# Patient Record
Sex: Male | Born: 1975
Health system: Southern US, Community
[De-identification: ages and names within clinical notes are randomized; demographics above are authoritative.]

## PROBLEM LIST (undated history)

## (undated) DIAGNOSIS — Z720 Tobacco use: Secondary | ICD-10-CM

## (undated) DIAGNOSIS — I2510 Atherosclerotic heart disease of native coronary artery without angina pectoris: Secondary | ICD-10-CM

## (undated) DIAGNOSIS — Z888 Allergy status to other drugs, medicaments and biological substances status: Secondary | ICD-10-CM

## (undated) DIAGNOSIS — M1990 Unspecified osteoarthritis, unspecified site: Secondary | ICD-10-CM

## (undated) DIAGNOSIS — Z9861 Coronary angioplasty status: Secondary | ICD-10-CM

## (undated) DIAGNOSIS — G5791 Unspecified mononeuropathy of right lower limb: Secondary | ICD-10-CM

## (undated) DIAGNOSIS — E785 Hyperlipidemia, unspecified: Secondary | ICD-10-CM

## (undated) DIAGNOSIS — I10 Essential (primary) hypertension: Secondary | ICD-10-CM

## (undated) DIAGNOSIS — S2502XA Major laceration of thoracic aorta, initial encounter: Secondary | ICD-10-CM

## (undated) DIAGNOSIS — I255 Ischemic cardiomyopathy: Secondary | ICD-10-CM

## (undated) HISTORY — PX: FRACTURE SURGERY: SHX138

## (undated) HISTORY — DX: Tobacco use: Z72.0

## (undated) HISTORY — PX: TOOTH EXTRACTION: SUR596

## (undated) HISTORY — PX: REPAIR THORACIC AORTA: SUR1211

## (undated) HISTORY — PX: ORTHOPEDIC SURGERY: SHX850

## (undated) HISTORY — PX: OTHER SURGICAL HISTORY: SHX169

---

## 2001-01-13 ENCOUNTER — Emergency Department (HOSPITAL_COMMUNITY): Admission: EM | Admit: 2001-01-13 | Discharge: 2001-01-14 | Payer: Self-pay | Admitting: *Deleted

## 2004-03-21 ENCOUNTER — Ambulatory Visit: Payer: Self-pay | Admitting: Critical Care Medicine

## 2004-03-21 ENCOUNTER — Inpatient Hospital Stay (HOSPITAL_COMMUNITY): Admission: AC | Admit: 2004-03-21 | Discharge: 2004-04-20 | Payer: Self-pay

## 2004-03-21 ENCOUNTER — Ambulatory Visit: Payer: Self-pay | Admitting: Internal Medicine

## 2004-03-21 ENCOUNTER — Ambulatory Visit: Payer: Self-pay | Admitting: Physical Medicine & Rehabilitation

## 2004-03-21 ENCOUNTER — Ambulatory Visit: Payer: Self-pay

## 2004-03-21 ENCOUNTER — Ambulatory Visit: Payer: Self-pay | Admitting: Plastic Surgery

## 2004-04-05 ENCOUNTER — Ambulatory Visit: Payer: Self-pay | Admitting: Cardiology

## 2004-04-05 ENCOUNTER — Ambulatory Visit: Payer: Self-pay | Admitting: Plastic Surgery

## 2004-04-20 ENCOUNTER — Ambulatory Visit: Payer: Self-pay | Admitting: Physical Medicine & Rehabilitation

## 2004-04-20 ENCOUNTER — Inpatient Hospital Stay (HOSPITAL_COMMUNITY)
Admission: RE | Admit: 2004-04-20 | Discharge: 2004-04-28 | Payer: Self-pay | Admitting: Physical Medicine & Rehabilitation

## 2004-05-12 ENCOUNTER — Encounter
Admission: RE | Admit: 2004-05-12 | Discharge: 2004-07-07 | Payer: Self-pay | Admitting: Physical Medicine & Rehabilitation

## 2004-05-13 ENCOUNTER — Emergency Department (HOSPITAL_COMMUNITY): Admission: EM | Admit: 2004-05-13 | Discharge: 2004-05-13 | Payer: Self-pay

## 2004-05-14 ENCOUNTER — Ambulatory Visit: Payer: Self-pay | Admitting: Physical Medicine & Rehabilitation

## 2004-06-30 ENCOUNTER — Emergency Department (HOSPITAL_COMMUNITY): Admission: EM | Admit: 2004-06-30 | Discharge: 2004-07-01 | Payer: Self-pay | Admitting: Emergency Medicine

## 2004-07-05 ENCOUNTER — Ambulatory Visit: Payer: Self-pay | Admitting: Physical Medicine & Rehabilitation

## 2004-07-31 ENCOUNTER — Encounter
Admission: RE | Admit: 2004-07-31 | Discharge: 2004-10-29 | Payer: Self-pay | Admitting: Physical Medicine & Rehabilitation

## 2004-08-03 ENCOUNTER — Ambulatory Visit: Payer: Self-pay | Admitting: Physical Medicine & Rehabilitation

## 2004-09-06 ENCOUNTER — Ambulatory Visit: Payer: Self-pay | Admitting: Physical Medicine & Rehabilitation

## 2004-10-01 ENCOUNTER — Ambulatory Visit: Payer: Self-pay | Admitting: Physical Medicine & Rehabilitation

## 2004-12-10 ENCOUNTER — Encounter
Admission: RE | Admit: 2004-12-10 | Discharge: 2005-03-10 | Payer: Self-pay | Admitting: Physical Medicine & Rehabilitation

## 2004-12-10 ENCOUNTER — Ambulatory Visit: Payer: Self-pay | Admitting: Physical Medicine & Rehabilitation

## 2005-01-17 ENCOUNTER — Ambulatory Visit: Payer: Self-pay | Admitting: Physical Medicine & Rehabilitation

## 2005-02-24 ENCOUNTER — Encounter: Admission: RE | Admit: 2005-02-24 | Discharge: 2005-02-24 | Payer: Self-pay | Admitting: Orthopedic Surgery

## 2005-03-04 ENCOUNTER — Ambulatory Visit: Payer: Self-pay | Admitting: Physical Medicine & Rehabilitation

## 2005-04-02 ENCOUNTER — Encounter
Admission: RE | Admit: 2005-04-02 | Discharge: 2005-07-01 | Payer: Self-pay | Admitting: Physical Medicine & Rehabilitation

## 2005-05-03 ENCOUNTER — Ambulatory Visit: Payer: Self-pay | Admitting: Physical Medicine & Rehabilitation

## 2005-06-07 ENCOUNTER — Ambulatory Visit (HOSPITAL_BASED_OUTPATIENT_CLINIC_OR_DEPARTMENT_OTHER): Admission: RE | Admit: 2005-06-07 | Discharge: 2005-06-08 | Payer: Self-pay | Admitting: Orthopedic Surgery

## 2005-07-01 ENCOUNTER — Encounter
Admission: RE | Admit: 2005-07-01 | Discharge: 2005-09-29 | Payer: Self-pay | Admitting: Physical Medicine & Rehabilitation

## 2005-07-01 ENCOUNTER — Ambulatory Visit: Payer: Self-pay | Admitting: Physical Medicine & Rehabilitation

## 2005-07-27 ENCOUNTER — Encounter
Admission: RE | Admit: 2005-07-27 | Discharge: 2005-10-25 | Payer: Self-pay | Admitting: Physical Medicine & Rehabilitation

## 2005-08-22 ENCOUNTER — Ambulatory Visit: Payer: Self-pay | Admitting: Physical Medicine & Rehabilitation

## 2005-10-04 ENCOUNTER — Ambulatory Visit (HOSPITAL_BASED_OUTPATIENT_CLINIC_OR_DEPARTMENT_OTHER): Admission: RE | Admit: 2005-10-04 | Discharge: 2005-10-04 | Payer: Self-pay | Admitting: Orthopedic Surgery

## 2005-10-20 ENCOUNTER — Ambulatory Visit: Payer: Self-pay | Admitting: Physical Medicine & Rehabilitation

## 2005-10-20 ENCOUNTER — Encounter
Admission: RE | Admit: 2005-10-20 | Discharge: 2006-01-18 | Payer: Self-pay | Admitting: Physical Medicine & Rehabilitation

## 2005-11-03 IMAGING — CR DG SINUSES COMPLETE 3+V
2 series · 2 of 2 positions shown · non-contrast
Comparison: none

CLINICAL DATA: Head injury. Clinical concern for sinusitis.

PARANASAL SINUSES - PORTABLE 2 VIEW

[view not recorded (1 of 2)]
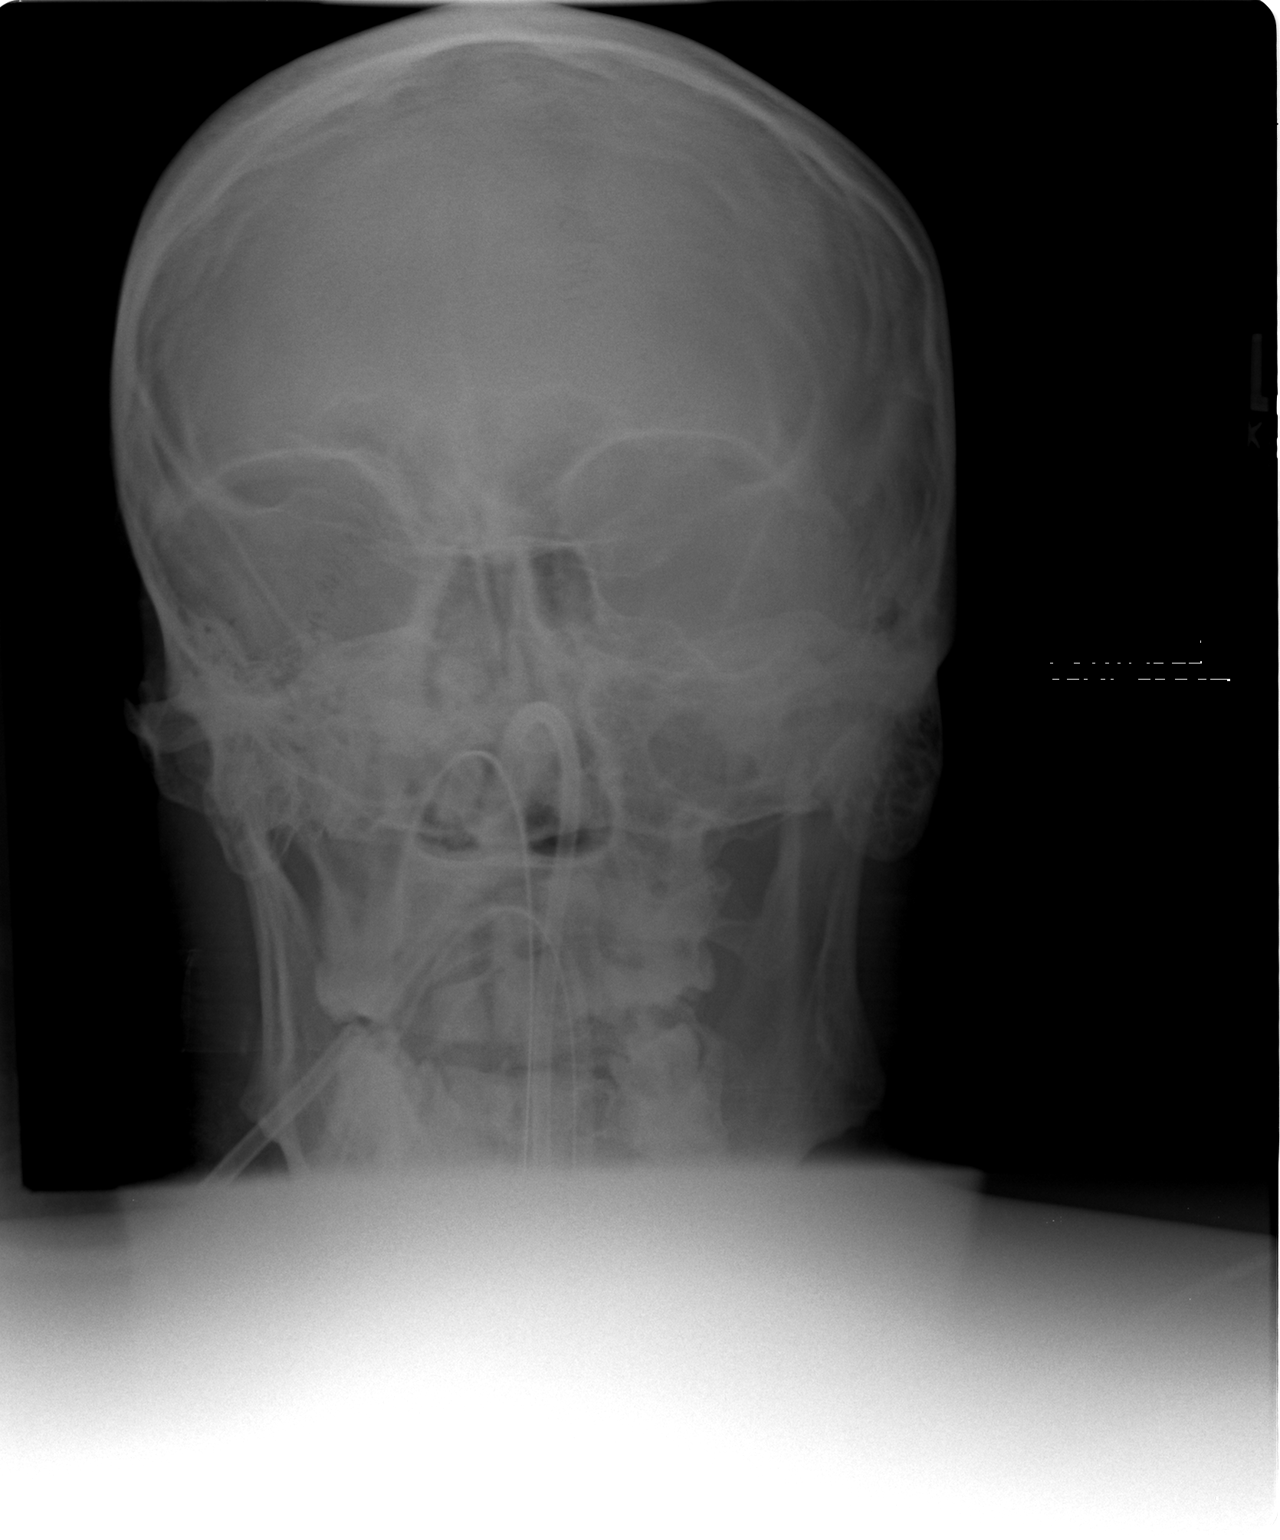

[view not recorded (2 of 2)]
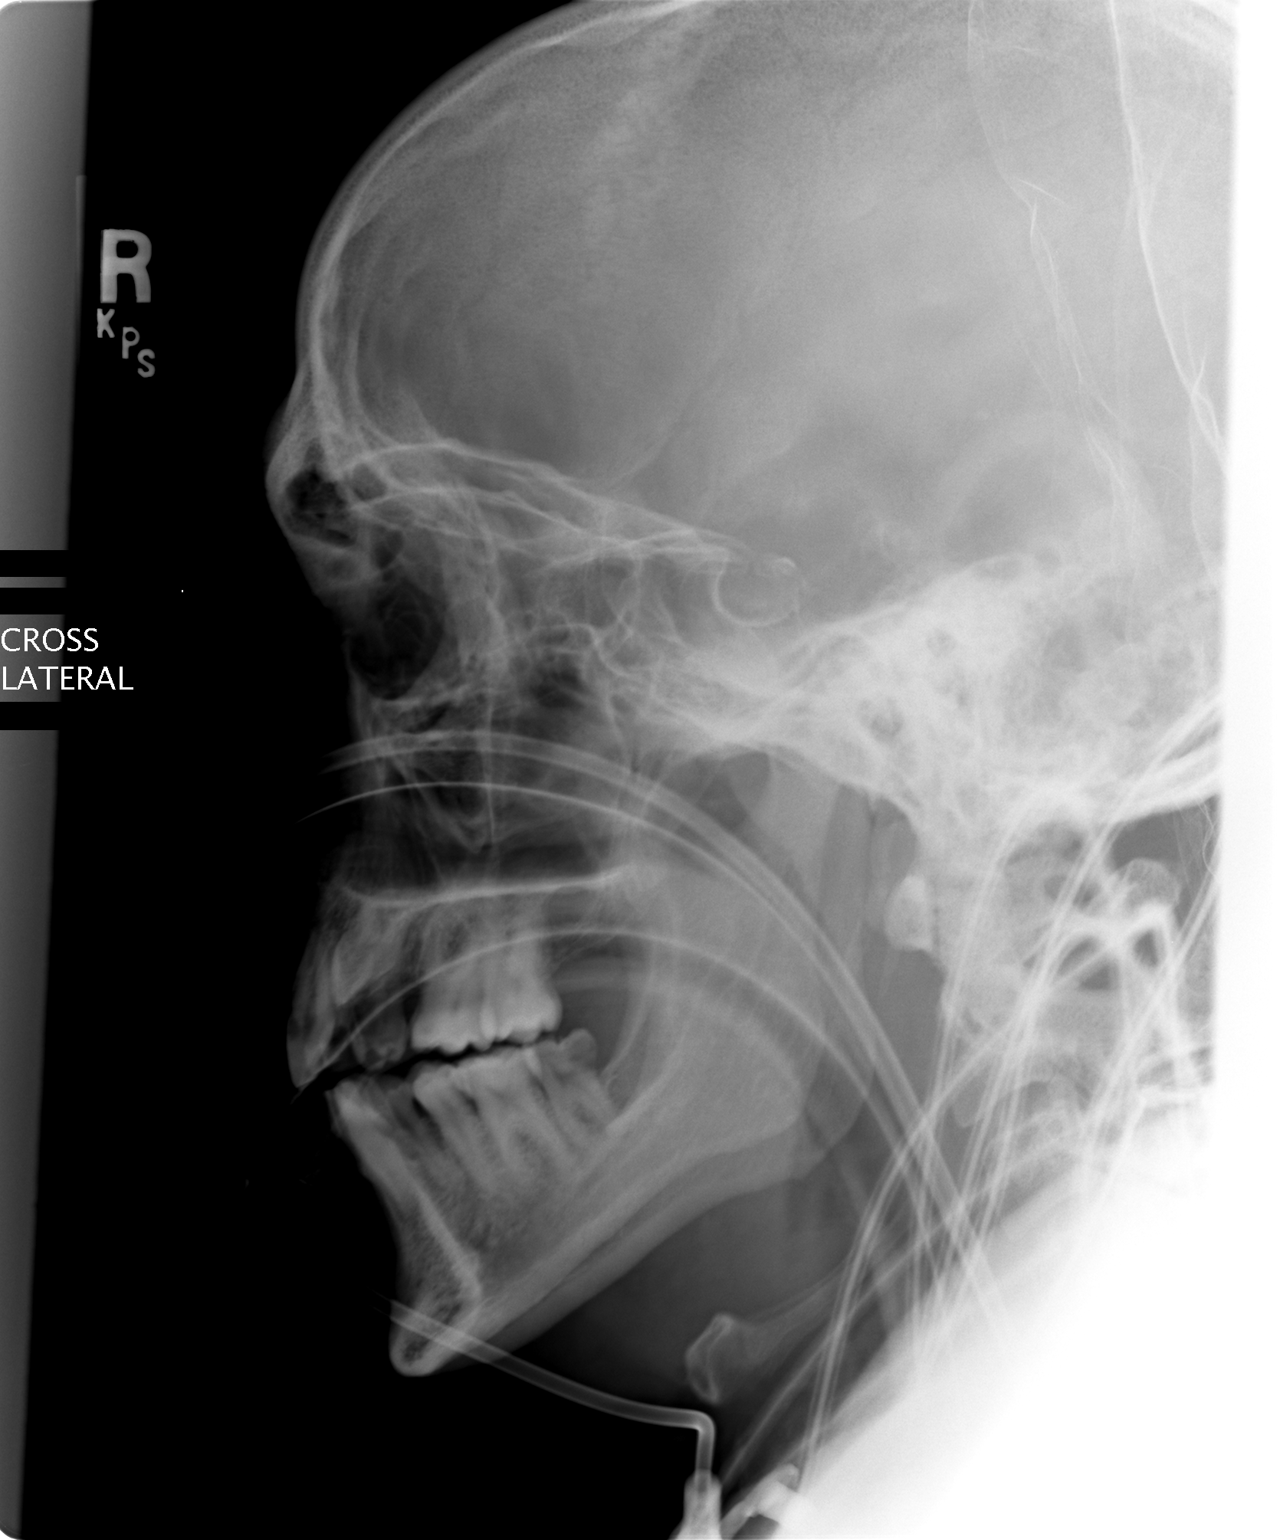

[2 of 2 positions shown; findings below may reference images not displayed]

FINDINGS: Portable frontal and lateral views of the paranasal sinuses are
correlated with the head CT dated 03/21/2004. The maxillary sinuses are obscured
by the petrous ridges on the frontal view. No gross mucosal thickening or
air-fluid levels seen.

IMPRESSION

Limited examination with no gross evidence of sinusitis. If there is a continued
clinical concern for possibility of sinusitis, a limited paranasal sinus CT
would be recommended.

## 2005-11-03 IMAGING — CR DG CHEST 1V PORT
1 series · 1 of 1 positions shown · non-contrast
Comparison: none

CLINICAL DATA: Multiple trauma.  
 PORTABLE CHEST ? ONE VIEW:
 Comparison to 04/04/04.
 Endotracheal tube, NG tube, small-bore feeding tube, and right-sided PICC line noted.  There has been interval removal of one of the left thoracostomy tubes.  No definite pneumothorax identified.  There is increasing interstitial pulmonary edema and basilar atelectasis.  Multiple left-sided rib fractures again noted.

[view not recorded]
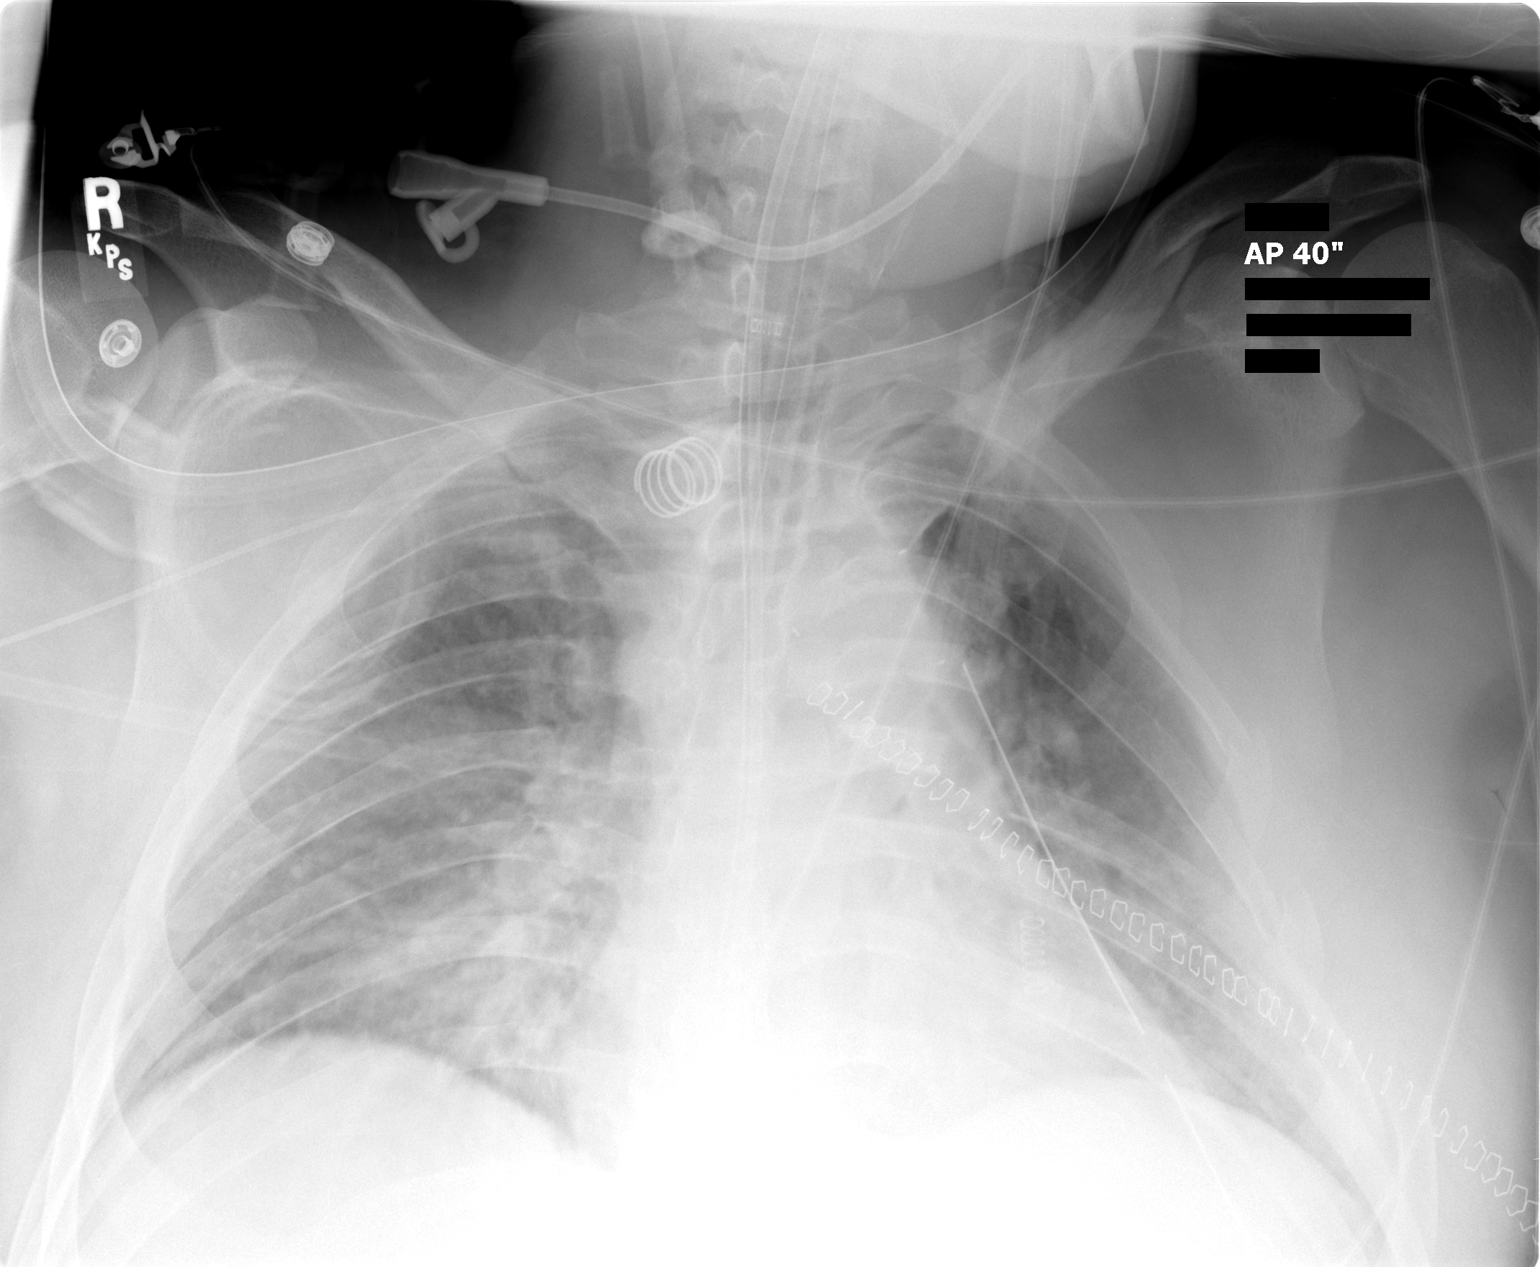

[1 of 1 positions shown; findings below may reference images not displayed]

IMPRESSION: 1.  Left thoracostomy tube removal with increasing pulmonary edema and basilar atelectasis.

## 2005-11-06 IMAGING — CR DG CHEST 1V PORT
1 series · 1 of 1 positions shown · non-contrast
Comparison: Earlier today.

CLINICAL DATA: Changed left chest tube from suction to water seal.

PORTABLE CHEST - 1 VIEW

[view not recorded]
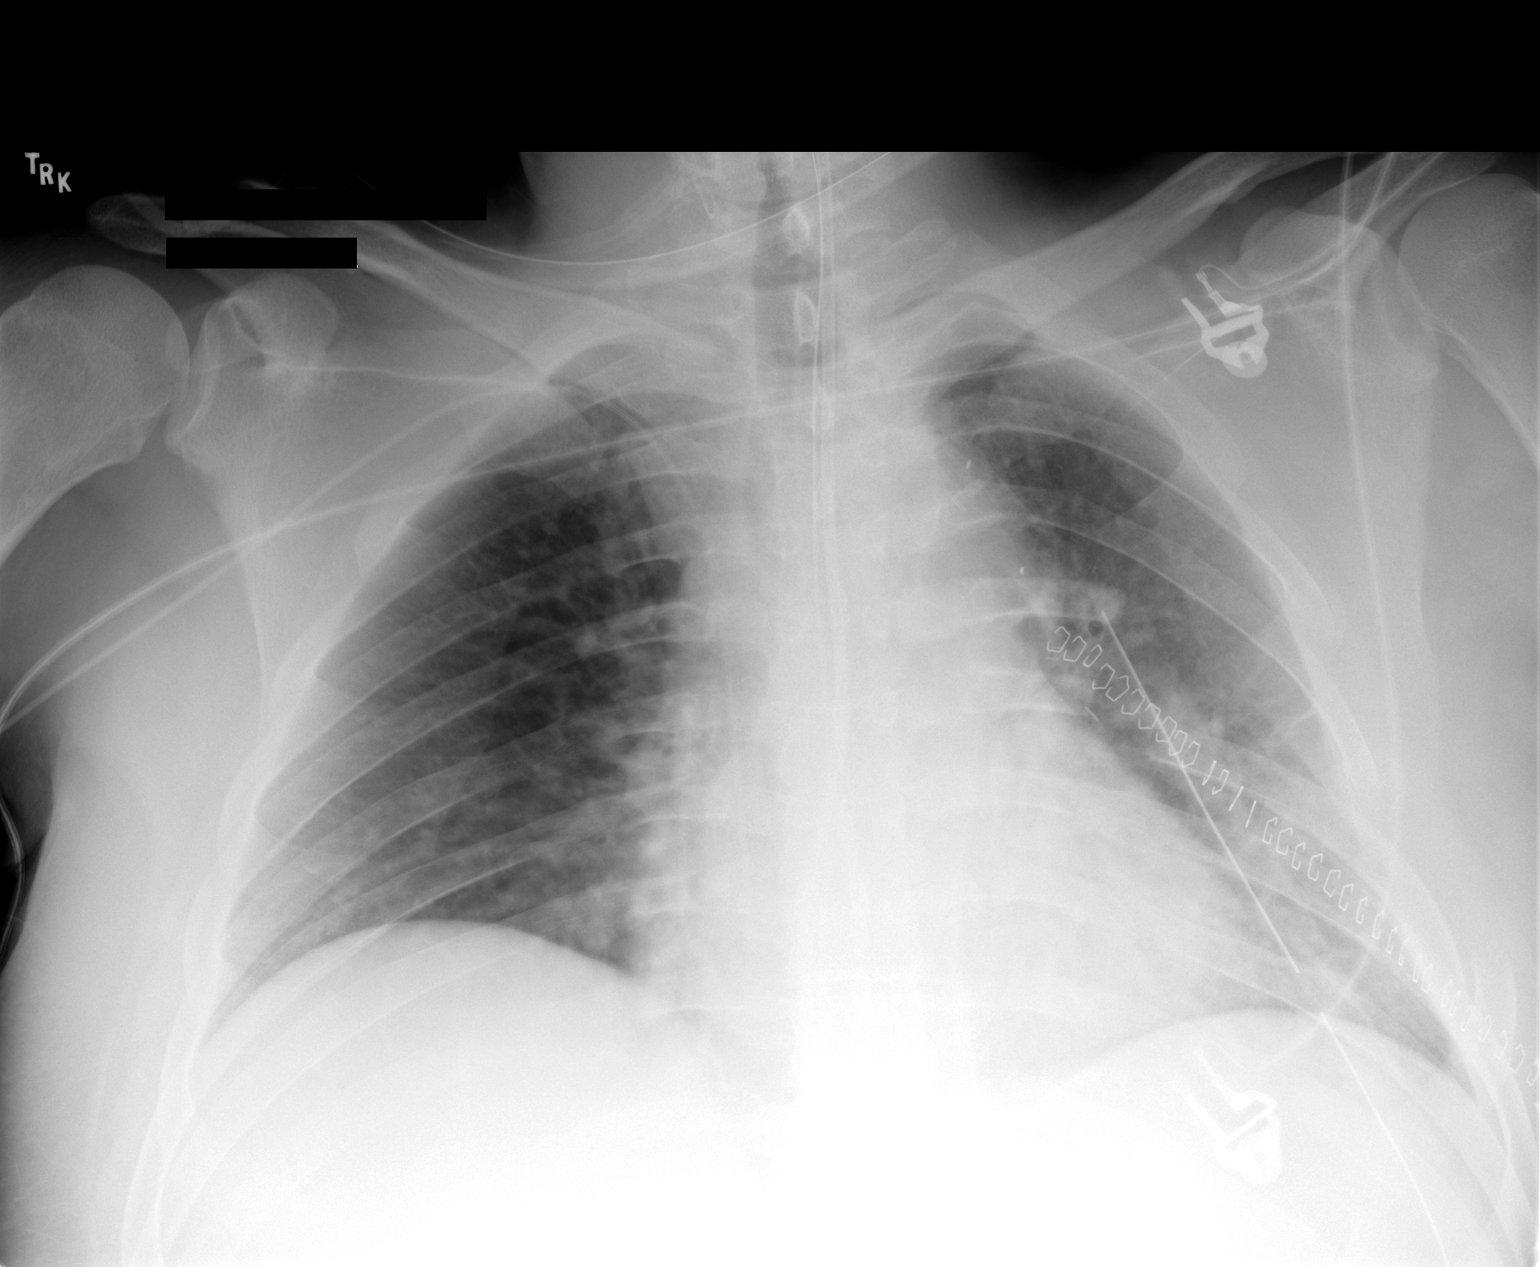

[1 of 1 positions shown; findings below may reference images not displayed]

FINDINGS: Stable left chest tube. No pneumothorax. Breathing motion artifact
with no gross change in diffuse prominence of the pulmonary vasculature and
borderline enlargement of the cardiac silhouette. Stable right PICC. Nasogastric
and feeding tubes remain in place. Stable left rib fractures.  

IMPRESSION

1. No pneumothorax.

2. Stable pulmonary vascular congestion and borderline cardiomegaly.

## 2005-11-07 IMAGING — CR DG CHEST 1V PORT
1 series · 1 of 1 positions shown · non-contrast
Comparison: 04/08/04.

CLINICAL DATA: Multiple trauma follow-up. 
 PORTABLE CHEST ONE VIEW 04/09/04:

[view not recorded]
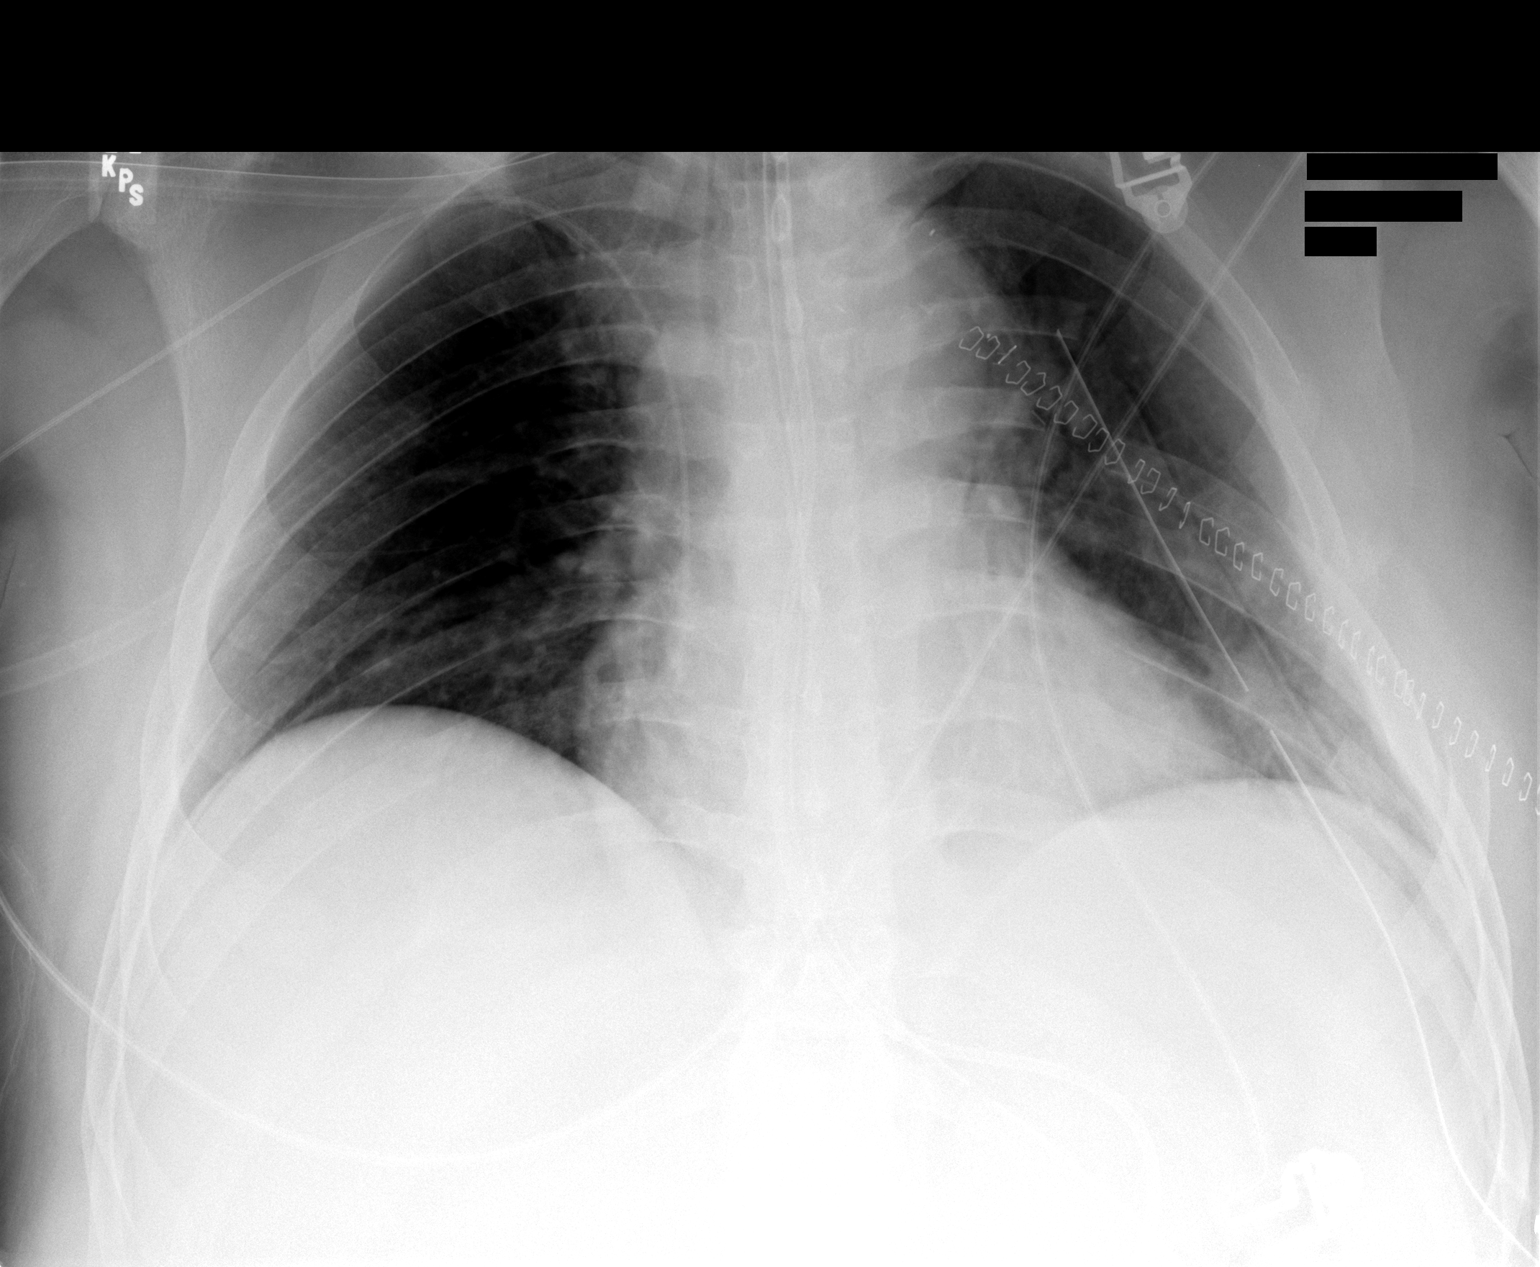

[1 of 1 positions shown; findings below may reference images not displayed]

Low volume film with stable NG tube, feeding tube, right-sided PICC line, and left thoracostomy tube.  Mild bibasilar atelectasis left greater than right is essentially unchanged.  There is no evidence of pneumothorax.  Rib fractures are again noted.
IMPRESSION: Stable chest.

## 2005-11-09 ENCOUNTER — Ambulatory Visit: Payer: Self-pay | Admitting: Physical Medicine & Rehabilitation

## 2005-11-09 ENCOUNTER — Encounter
Admission: RE | Admit: 2005-11-09 | Discharge: 2006-02-07 | Payer: Self-pay | Admitting: Physical Medicine & Rehabilitation

## 2005-12-30 ENCOUNTER — Ambulatory Visit: Payer: Self-pay | Admitting: Physical Medicine & Rehabilitation

## 2006-01-25 ENCOUNTER — Encounter: Payer: Self-pay | Admitting: Orthopedic Surgery

## 2006-01-25 ENCOUNTER — Ambulatory Visit (HOSPITAL_COMMUNITY): Admission: RE | Admit: 2006-01-25 | Discharge: 2006-01-26 | Payer: Self-pay | Admitting: Orthopedic Surgery

## 2006-01-31 ENCOUNTER — Encounter
Admission: RE | Admit: 2006-01-31 | Discharge: 2006-05-01 | Payer: Self-pay | Admitting: Physical Medicine & Rehabilitation

## 2006-01-31 ENCOUNTER — Ambulatory Visit: Payer: Self-pay | Admitting: Physical Medicine & Rehabilitation

## 2006-02-28 ENCOUNTER — Ambulatory Visit: Payer: Self-pay | Admitting: Physical Medicine & Rehabilitation

## 2006-04-28 ENCOUNTER — Ambulatory Visit: Payer: Self-pay | Admitting: Physical Medicine & Rehabilitation

## 2006-05-25 ENCOUNTER — Encounter
Admission: RE | Admit: 2006-05-25 | Discharge: 2006-08-23 | Payer: Self-pay | Admitting: Physical Medicine & Rehabilitation

## 2006-06-21 ENCOUNTER — Ambulatory Visit: Payer: Self-pay | Admitting: Physical Medicine & Rehabilitation

## 2006-08-18 ENCOUNTER — Ambulatory Visit: Payer: Self-pay | Admitting: Physical Medicine & Rehabilitation

## 2006-09-15 ENCOUNTER — Encounter
Admission: RE | Admit: 2006-09-15 | Discharge: 2006-12-14 | Payer: Self-pay | Admitting: Physical Medicine & Rehabilitation

## 2006-10-18 ENCOUNTER — Ambulatory Visit: Payer: Self-pay | Admitting: Physical Medicine & Rehabilitation

## 2006-12-08 ENCOUNTER — Ambulatory Visit: Payer: Self-pay | Admitting: Physical Medicine & Rehabilitation

## 2007-01-08 ENCOUNTER — Encounter
Admission: RE | Admit: 2007-01-08 | Discharge: 2007-04-08 | Payer: Self-pay | Admitting: Physical Medicine & Rehabilitation

## 2007-02-06 ENCOUNTER — Ambulatory Visit: Payer: Self-pay | Admitting: Physical Medicine & Rehabilitation

## 2007-04-03 ENCOUNTER — Ambulatory Visit: Payer: Self-pay | Admitting: Physical Medicine & Rehabilitation

## 2007-05-07 ENCOUNTER — Ambulatory Visit: Payer: Self-pay | Admitting: Physical Medicine & Rehabilitation

## 2007-05-07 ENCOUNTER — Encounter
Admission: RE | Admit: 2007-05-07 | Discharge: 2007-08-05 | Payer: Self-pay | Admitting: Physical Medicine & Rehabilitation

## 2007-06-01 ENCOUNTER — Ambulatory Visit: Payer: Self-pay | Admitting: Physical Medicine & Rehabilitation

## 2007-07-31 ENCOUNTER — Encounter
Admission: RE | Admit: 2007-07-31 | Discharge: 2007-10-29 | Payer: Self-pay | Admitting: Physical Medicine & Rehabilitation

## 2007-08-03 ENCOUNTER — Ambulatory Visit: Payer: Self-pay | Admitting: Physical Medicine & Rehabilitation

## 2007-08-30 ENCOUNTER — Ambulatory Visit: Payer: Self-pay | Admitting: Physical Medicine & Rehabilitation

## 2007-09-27 ENCOUNTER — Ambulatory Visit: Payer: Self-pay | Admitting: Physical Medicine & Rehabilitation

## 2007-10-22 ENCOUNTER — Encounter
Admission: RE | Admit: 2007-10-22 | Discharge: 2008-01-20 | Payer: Self-pay | Admitting: Physical Medicine & Rehabilitation

## 2007-10-24 ENCOUNTER — Ambulatory Visit: Payer: Self-pay | Admitting: Physical Medicine & Rehabilitation

## 2007-11-21 ENCOUNTER — Ambulatory Visit: Payer: Self-pay | Admitting: Physical Medicine & Rehabilitation

## 2007-12-19 ENCOUNTER — Ambulatory Visit: Payer: Self-pay | Admitting: Physical Medicine & Rehabilitation

## 2008-01-14 ENCOUNTER — Ambulatory Visit: Payer: Self-pay | Admitting: Physical Medicine & Rehabilitation

## 2008-02-06 ENCOUNTER — Encounter
Admission: RE | Admit: 2008-02-06 | Discharge: 2008-05-06 | Payer: Self-pay | Admitting: Physical Medicine & Rehabilitation

## 2008-02-08 ENCOUNTER — Ambulatory Visit: Payer: Self-pay | Admitting: Physical Medicine & Rehabilitation

## 2008-03-07 ENCOUNTER — Ambulatory Visit: Payer: Self-pay | Admitting: Physical Medicine & Rehabilitation

## 2008-04-04 ENCOUNTER — Ambulatory Visit: Payer: Self-pay | Admitting: Physical Medicine & Rehabilitation

## 2008-04-30 ENCOUNTER — Ambulatory Visit: Payer: Self-pay | Admitting: Physical Medicine & Rehabilitation

## 2008-05-21 ENCOUNTER — Encounter
Admission: RE | Admit: 2008-05-21 | Discharge: 2008-08-15 | Payer: Self-pay | Admitting: Physical Medicine & Rehabilitation

## 2008-05-30 ENCOUNTER — Ambulatory Visit: Payer: Self-pay | Admitting: Physical Medicine & Rehabilitation

## 2008-06-25 ENCOUNTER — Ambulatory Visit: Payer: Self-pay | Admitting: Physical Medicine & Rehabilitation

## 2008-07-25 ENCOUNTER — Ambulatory Visit: Payer: Self-pay | Admitting: Physical Medicine & Rehabilitation

## 2008-08-15 ENCOUNTER — Encounter
Admission: RE | Admit: 2008-08-15 | Discharge: 2008-11-12 | Payer: Self-pay | Admitting: Physical Medicine & Rehabilitation

## 2008-08-20 ENCOUNTER — Ambulatory Visit: Payer: Self-pay | Admitting: Physical Medicine & Rehabilitation

## 2008-09-17 ENCOUNTER — Ambulatory Visit: Payer: Self-pay | Admitting: Physical Medicine & Rehabilitation

## 2008-10-17 ENCOUNTER — Ambulatory Visit: Payer: Self-pay | Admitting: Physical Medicine & Rehabilitation

## 2008-11-12 ENCOUNTER — Encounter
Admission: RE | Admit: 2008-11-12 | Discharge: 2009-02-10 | Payer: Self-pay | Admitting: Physical Medicine & Rehabilitation

## 2008-11-14 ENCOUNTER — Ambulatory Visit: Payer: Self-pay | Admitting: Physical Medicine & Rehabilitation

## 2008-12-15 ENCOUNTER — Ambulatory Visit: Payer: Self-pay | Admitting: Physical Medicine & Rehabilitation

## 2009-01-16 ENCOUNTER — Ambulatory Visit: Payer: Self-pay | Admitting: Physical Medicine & Rehabilitation

## 2009-02-10 ENCOUNTER — Encounter
Admission: RE | Admit: 2009-02-10 | Discharge: 2009-04-29 | Payer: Self-pay | Admitting: Physical Medicine & Rehabilitation

## 2009-02-13 ENCOUNTER — Ambulatory Visit: Payer: Self-pay | Admitting: Physical Medicine & Rehabilitation

## 2009-03-16 ENCOUNTER — Ambulatory Visit: Payer: Self-pay | Admitting: Physical Medicine & Rehabilitation

## 2009-04-15 ENCOUNTER — Ambulatory Visit: Payer: Self-pay | Admitting: Physical Medicine & Rehabilitation

## 2009-05-15 ENCOUNTER — Encounter
Admission: RE | Admit: 2009-05-15 | Discharge: 2009-08-13 | Payer: Self-pay | Admitting: Physical Medicine & Rehabilitation

## 2009-05-18 ENCOUNTER — Ambulatory Visit: Payer: Self-pay | Admitting: Physical Medicine & Rehabilitation

## 2009-06-11 ENCOUNTER — Ambulatory Visit: Payer: Self-pay | Admitting: Physical Medicine & Rehabilitation

## 2009-07-08 ENCOUNTER — Ambulatory Visit: Payer: Self-pay | Admitting: Physical Medicine & Rehabilitation

## 2009-07-30 ENCOUNTER — Encounter (HOSPITAL_BASED_OUTPATIENT_CLINIC_OR_DEPARTMENT_OTHER)
Admission: RE | Admit: 2009-07-30 | Discharge: 2009-10-28 | Payer: Self-pay | Admitting: Physical Medicine & Rehabilitation

## 2009-08-06 ENCOUNTER — Ambulatory Visit: Payer: Self-pay | Admitting: Physical Medicine & Rehabilitation

## 2009-09-08 ENCOUNTER — Encounter
Admission: RE | Admit: 2009-09-08 | Discharge: 2009-12-07 | Payer: Self-pay | Admitting: Physical Medicine & Rehabilitation

## 2009-09-10 ENCOUNTER — Ambulatory Visit: Payer: Self-pay | Admitting: Physical Medicine & Rehabilitation

## 2009-09-25 ENCOUNTER — Ambulatory Visit (HOSPITAL_COMMUNITY): Admission: RE | Admit: 2009-09-25 | Discharge: 2009-09-25 | Payer: Self-pay | Admitting: General Surgery

## 2009-10-08 ENCOUNTER — Ambulatory Visit: Payer: Self-pay | Admitting: Physical Medicine & Rehabilitation

## 2009-11-04 ENCOUNTER — Ambulatory Visit: Payer: Self-pay | Admitting: Physical Medicine & Rehabilitation

## 2009-11-04 ENCOUNTER — Encounter (HOSPITAL_BASED_OUTPATIENT_CLINIC_OR_DEPARTMENT_OTHER): Admission: RE | Admit: 2009-11-04 | Discharge: 2010-02-02 | Payer: Self-pay | Admitting: Internal Medicine

## 2009-12-03 ENCOUNTER — Ambulatory Visit: Payer: Self-pay | Admitting: Physical Medicine & Rehabilitation

## 2009-12-25 ENCOUNTER — Encounter
Admission: RE | Admit: 2009-12-25 | Discharge: 2010-03-25 | Payer: Self-pay | Source: Home / Self Care | Admitting: Physical Medicine & Rehabilitation

## 2009-12-31 ENCOUNTER — Ambulatory Visit: Payer: Self-pay | Admitting: Physical Medicine & Rehabilitation

## 2010-05-18 ENCOUNTER — Inpatient Hospital Stay (HOSPITAL_COMMUNITY): Admission: EM | Admit: 2010-05-18 | Discharge: 2010-05-20 | Payer: Self-pay | Source: Home / Self Care

## 2010-05-19 LAB — BASIC METABOLIC PANEL
BUN: 10 mg/dL (ref 6–23)
CO2: 31 mEq/L (ref 19–32)
Calcium: 9.5 mg/dL (ref 8.4–10.5)
Chloride: 105 mEq/L (ref 96–112)
Creatinine, Ser: 0.89 mg/dL (ref 0.4–1.5)
GFR calc Af Amer: >60 mL/min (ref >60)
GFR calc non Af Amer: >60 mL/min (ref >60)
Glucose, Bld: 89 mg/dL (ref 70–99)
Potassium: 4.1 mEq/L (ref 3.5–5.1)
Sodium: 142 mEq/L (ref 135–145)

## 2010-05-19 LAB — DIFFERENTIAL
Basophils Absolute: 0 10*3/uL (ref 0.0–0.1)
Basophils Relative: 0 % (ref 0–1)
Eosinophils Absolute: 0.2 10*3/uL (ref 0.0–0.7)
Eosinophils Relative: 1 % (ref 0–5)
Lymphocytes Relative: 27 % (ref 12–46)
Lymphs Abs: 3.1 10*3/uL (ref 0.7–4.0)
Monocytes Absolute: 0.9 10*3/uL (ref 0.1–1.0)
Monocytes Relative: 8 % (ref 3–12)
Neutro Abs: 7.4 10*3/uL (ref 1.7–7.7)
Neutrophils Relative %: 64 % (ref 43–77)

## 2010-05-19 LAB — CBC
HCT: 46.7 % (ref 39.0–52.0)
Hemoglobin: 16.6 g/dL (ref 13.0–17.0)
MCH: 33.6 pg (ref 26.0–34.0)
MCHC: 35.5 g/dL (ref 30.0–36.0)
MCV: 94.5 fL (ref 78.0–100.0)
Platelets: 166 10*3/uL (ref 150–400)
RBC: 4.94 MIL/uL (ref 4.22–5.81)
RDW: 13.2 % (ref 11.5–15.5)
WBC: 11.6 10*3/uL — ABNORMAL HIGH (ref 4.0–10.5)

## 2010-05-22 ENCOUNTER — Encounter: Payer: Self-pay | Admitting: Physical Medicine & Rehabilitation

## 2010-05-22 ENCOUNTER — Encounter: Payer: Self-pay | Admitting: Cardiothoracic Surgery

## 2010-05-24 LAB — COMPREHENSIVE METABOLIC PANEL
AST: 21 U/L (ref 0–37)
Albumin: 3.5 g/dL (ref 3.5–5.2)
BUN: 9 mg/dL (ref 6–23)
CO2: 26 mEq/L (ref 19–32)
Calcium: 8.8 mg/dL (ref 8.4–10.5)
Chloride: 105 mEq/L (ref 96–112)
Creatinine, Ser: 0.88 mg/dL (ref 0.4–1.5)
GFR calc Af Amer: >60 mL/min (ref >60)
GFR calc non Af Amer: >60 mL/min (ref >60)
Potassium: 3.9 mEq/L (ref 3.5–5.1)
Total Bilirubin: 0.6 mg/dL (ref 0.3–1.2)
Total Protein: 6.3 g/dL (ref 6.0–8.3)

## 2010-05-24 LAB — CBC
HCT: 43.4 % (ref 39.0–52.0)
HCT: 42 % (ref 39.0–52.0)
Hemoglobin: 15.1 g/dL (ref 13.0–17.0)
Hemoglobin: 14.4 g/dL (ref 13.0–17.0)
MCH: 32.8 pg (ref 26.0–34.0)
MCH: 32.5 pg (ref 26.0–34.0)
MCHC: 34.8 g/dL (ref 30.0–36.0)
MCHC: 34.3 g/dL (ref 30.0–36.0)
MCV: 94.1 fL (ref 78.0–100.0)
MCV: 94.8 fL (ref 78.0–100.0)
Platelets: 157 10*3/uL (ref 150–400)
RBC: 4.61 MIL/uL (ref 4.22–5.81)
RBC: 4.43 MIL/uL (ref 4.22–5.81)
RDW: 13.2 % (ref 11.5–15.5)
RDW: 13.3 % (ref 11.5–15.5)
WBC: 12.4 10*3/uL — ABNORMAL HIGH (ref 4.0–10.5)
WBC: 8.9 10*3/uL (ref 4.0–10.5)

## 2010-05-24 LAB — BASIC METABOLIC PANEL
BUN: 7 mg/dL (ref 6–23)
Calcium: 8.9 mg/dL (ref 8.4–10.5)
Chloride: 102 mEq/L (ref 96–112)
Creatinine, Ser: 0.76 mg/dL (ref 0.4–1.5)
GFR calc Af Amer: >60 mL/min (ref >60)
GFR calc non Af Amer: >60 mL/min (ref >60)
Potassium: 4.2 mEq/L (ref 3.5–5.1)
Sodium: 136 mEq/L (ref 135–145)

## 2010-05-24 LAB — DIFFERENTIAL
Basophils Absolute: 0 10*3/uL (ref 0.0–0.1)
Basophils Relative: 0 % (ref 0–1)
Basophils Relative: 0 % (ref 0–1)
Eosinophils Absolute: 0.3 10*3/uL (ref 0.0–0.7)
Eosinophils Absolute: 0.2 10*3/uL (ref 0.0–0.7)
Eosinophils Relative: 2 % (ref 0–5)
Lymphocytes Relative: 28 % (ref 12–46)
Lymphocytes Relative: 25 % (ref 12–46)
Lymphs Abs: 3.4 10*3/uL (ref 0.7–4.0)
Lymphs Abs: 2.3 10*3/uL (ref 0.7–4.0)
Monocytes Absolute: 1.1 10*3/uL — ABNORMAL HIGH (ref 0.1–1.0)
Monocytes Absolute: 0.8 10*3/uL (ref 0.1–1.0)
Monocytes Relative: 9 % (ref 3–12)
Monocytes Relative: 9 % (ref 3–12)
Neutro Abs: 7.6 10*3/uL (ref 1.7–7.7)
Neutro Abs: 5.7 10*3/uL (ref 1.7–7.7)
Neutrophils Relative %: 61 % (ref 43–77)
Neutrophils Relative %: 64 % (ref 43–77)

## 2010-05-24 LAB — VANCOMYCIN, TROUGH: Vancomycin Tr: 6.5 ug/mL — ABNORMAL LOW (ref 10.0–20.0)

## 2010-05-25 LAB — WOUND CULTURE

## 2010-05-27 NOTE — Consult Note (Addendum)
NAMETORELL, MINDER             ACCOUNT NO.:  [id number]  MEDICAL RECORD NO.:  [id number]          PATIENT TYPE:  INP  LOCATION:  A227                          FACILITY:  [facility]  PHYSICIAN:  Tilford Pillar, MD      DATE OF BIRTH:  March 10, 1976  DATE OF CONSULTATION:  05/19/2010 DATE OF DISCHARGE:                                CONSULTATION   REASON FOR CONSULTATION:  Left foot pain, heel pain.  HISTORY OF PRESENT ILLNESS:  The patient is a 35 year old male with a longstanding ulcerative wound on his left calcaneal region.  He had actually had extensive [facility] [place] with hyperbaric treatments as well as surgical debridement.  He stated actually been doing very well but over the last couple of days he had increasing pain in this area and some erythema.  He has had no drainage. She denies any fevers or chills.  He denies any trauma, otherwise no other issues were noted with the foot.  He says it is extremely painful to walk on this area.  PAST MEDICAL HISTORY:  History of right lower extremity trauma. Otherwise unremarkable.  PAST SURGICAL HISTORY:  He has had a left anterior cruciate repair.  He has had a thoracotomy due to his trauma and open surgical repair of the fractures of the right foot and leg.  MEDICATIONS:  No home medications.  ALLERGIES:  PENICILLIN and ASPIRIN.  SOCIAL HISTORY:  Positive tobacco.  Occasional alcohol.  No recreational drug abuse.  FAMILY HISTORY:  Noncontributory.  REVIEW OF SYSTEMS:  CONSTITUTIONAL:  Unremarkable.  EYES:  Unremarkable. EAR, NOSE, AND THROAT:  Unremarkable.  RESPIRATORY:  Unremarkable. CARDIOVASCULAR:  Unremarkable.  GASTROINTESTINAL:  Unremarkable. GENITOURINARY:  Unremarkable.  MUSCULOSKELETAL AND SKIN:  As per HPI. ENDOCRINE:  Unremarkable.  PHYSICAL EXAMINATION:  VITAL SIGNS:  Temperature 97.89, heart rate 95, respirations 20, blood pressure 161/92.  He is 94% O2 saturation on  room air. GENERAL:  He is in no acute distress, but he does appear uncomfortable. He is lying in a supine position on his hospital bed. HEENT:  Scalp no deformities or masses.  EYES:  Pupils equal, round, reactive to light and accommodation.  Extraocular movements are intact. No conjunctival pallor.  Oral mucosa pink.  Normal occlusion. NECK:  Trachea is midline. PULMONARY:  Unlabored respiration.  He is clear to auscultation. CARDIOVASCULAR:  Regular rate and rhythm.  No murmurs or gallops. ABDOMEN:  Positive bowel sounds.  Soft and nontender.  No hernias or masses. EXTREMITIES:  Warm and dry.  Evaluation of the left lower extremity demonstrates an erythematous area on the posterior aspect of the foot near the calcaneal region.  There is an area of fluctuance over radial scar in this area and noted erythema with streaking within the skin coming from this area, going up the ankle is exquisitely tender surrounding this area.  No discharge or drainage is noted.  ASSESSMENT AND PLAN:  Left heel abscess.  At this time, the patient had had a plain film of the foot which demonstrated no evidence of osteomyelitis based on the findings I did recommend the patient to undergo  incision and drainage.  Risks, benefits, and alternatives were discussed with the patient as well as continuing antibiotics to the IV for the next 24-48 hours.  PROCEDURE:  Consent was obtained for incision and drainage of the abscess of the left heel.  The area was prepped with Betadine solution. Local anesthetic using 1% lidocaine was utilized to anesthetize the area and then a 15 blade scalpel was utilized to create the incision and drainage.  The copious amount of purulent material and foul-smelling purulence was encountered.  This was cultured and sent for micro evaluation of the drainage.  At this time, I did place a piece of packing into the wound, placed a 4x4 dressing and a ABD for padding and then gently  wrapped the dressing with a Kerlix roll to keep it in position.  The patient tolerated the procedure extremely well.     Tilford Pillar, MD     BZ/MEDQ  D:  05/19/2010  T:  05/20/2010  Job:  [id number]  Electronically Signed by Tilford Pillar MD on 05/27/2010 04:58:56 PM

## 2010-06-02 NOTE — H&P (Signed)
Brian Caldwell, Brian Caldwell             ACCOUNT NO.:  [id number]  MEDICAL RECORD NO.:  [id number]          PATIENT TYPE:  EMS  LOCATION:  ED                            FACILITY:  [facility]  PHYSICIAN:  Eduard Clos, MDDATE OF BIRTH:  01/10/1976  DATE OF ADMISSION:  05/18/2010 DATE OF DISCHARGE:  LH                             HISTORY & PHYSICAL   PRIMARY CARE PHYSICIAN:  Unassigned.  CHIEF COMPLAINT:  Left leg pain and erythema.  HISTORY OF PRESENT ILLNESS:  A 35 year old male with known history of chronic left leg wound has been experiencing increasing swelling, erythema and pain in the left foot over the last 2 days.  Denies any fever, chills.  Denies any oozing from the site.  Came to the ER and at this time has been admitted for cellulitis.  X-rays do not show any osteomyelitis.  The patient denies any shortness of breath, chest pain, nausea, vomiting, abdominal pain.  Denies any fever or chills, dizziness, loss of consciousness or any focal deficit.  PAST MEDICAL HISTORY:  History of trauma in 2005, wherein the patient had to have thoracotomy and had surgeries for his lower extremities due to fracture had sustained at the time, right peroneal nerve injury, right foot trauma, and left anterior cruciate ligament injury.  MEDICATIONS PRIOR TO ADMISSION:  None.  FAMILY HISTORY:  Nothing contributory.  SOCIAL HISTORY:  The patient smokes cigarettes and has been advised to quit smoking.  Drinks alcohol very socially.  Denies any drug abuse.  ALLERGIES:  PENICILLIN AND ASPIRIN.  REVIEW OF SYSTEMS:  Per history of present illness, nothing else significant.  PHYSICAL EXAMINATION:  The patient examined at bedside, not in acute distress. VITAL SIGNS: Blood pressure is 145/95, pulse 94 per minute, temperature 98.8, respirations 18 per minute, O2 sat 97%. HEENT: Anicteric.  No pallor.  No discharge from ears, eyes, nose or mouth. CHEST: Bilateral air entry present.  No  rhonchi.  No crepitation. HEART: S1, S2 heard. ABDOMEN: Soft, nontender.  Bowel sounds heard. CNS: The patient is alert, awake, and oriented to time, place, and person.  Moves upper and lower extremities 5/5. EXTREMITIES: There is a chronic looking wound on the left foot posterior aspect that is around 2 cm in diameter.  There is no active discharge. There is erythema surrounding it and some streaks of erythema going up to his mid calf.  Pulses are felt.  No acute ischemic changes.  No clubbing or cyanosis.  LABORATORY DATA:  X-ray of the left foot shows ulcer posterior aspect of the heel.  No plain film findings of adjacent osteomyelitis.  CBC: WBC 11.6, hemoglobin 16.6, hematocrit is 46.7, platelets 156.  Basic metabolic panel: Sodium 142, potassium 4.1, chloride 105, carbon dioxide 31, glucose 89, BUN 10, creatinine 0.8, calcium 9.5.  ASSESSMENT: 1. Cellulitis of the left foot. 2. History of chronic left foot wound. 3. History of trauma. 4. Tobacco abuse.  PLAN: 1. At this time, will admit the patient to medical floor. 2. For his cellulitis, will place the patient on vancomycin and     ciprofloxacin. 3. Place the patient on pain relief medication. 4.  If his symptoms worsen, may consider MRI of the left foot.     Eduard Clos, MD     ANK/MEDQ  D:  05/19/2010  T:  05/19/2010  Job:  [id number]  Electronically Signed by Midge Minium MD on 06/02/2010 09:58:58 AM

## 2010-09-14 NOTE — Assessment & Plan Note (Signed)
The patient is back regarding his choric pain issues.  He had been doing  a bit better when I last saw him, but unfortunately, his mother became  increasingly ill with her cancer and passed away a few days ago.  He is  extremely depressed over this as expected.  He rates his pain 8/10 and  does not notice specific change there.  Pain is sharp, stabbing, and  constant.  Pain interferes with his general activity, relations with  others, enjoyment of life on a moderate-to-severe level.  Sleep is poor.   REVIEW OF SYSTEMS:  Notable for numbness, tremor, tingling, spasm,  depression, limb swelling, smoking, although he is trying to quit this  now, especially to the fact surrounding his mother's death.   SOCIAL HISTORY:  As noted above.  He states that he has family and  church members who are supportive and can help him.  He does feel  extremely sadden and depressed at this point.   PHYSICAL EXAMINATION:  Blood pressure 160/95, pulse 101, respiratory  rate 18, he is sating 97% on room air.  The patient is pleasant, alert  and oriented x3.  Affect is bright and appropriate.  Weight is generally  stable.  He walks with his cane and still has some problems with right  foot weightbearing.  Pluses are 2+.  His skin color is stable.  He is  tearful and crying during our visit today.  Heart is regular.  Chest is  clear.  Abdomen is soft, nontender.   ASSESSMENT:  1. Traumatic brain injury.  2. Right peroneal nerve injury.  3. Reactive mood decreased from the passing of his mother.  4. Left ankle wound and left anterior cruciate ligament injury.   PLAN:  1. I encouraged the patient use his social supports to lean on.  We      did increase his Wellbutrin XR to 200 mg daily.  If he needs      professional counseling and help, I am more happy to refer him as      appropriate.  2. We refilled his OxyContin CR 80 mg 1 q.8 h., #90 and Percocet      10/650 one q.8 h., #90.  3. We will see him back  in 3 months with me and 1 month in the Nurse      Clinic.      Ranelle Oyster, M.D.  Electronically Signed     ZTS/MedQ  D:  04/04/2008 13:55:28  T:  04/05/2008 06:01:12  Job #:  [id number]

## 2010-09-14 NOTE — Assessment & Plan Note (Signed)
Brian Caldwell is back regarding his chronic pain.  He has been fairly stable  over the last few months.  He has received some payment on his  disability retroactive to his initial injury.  He rates his pain is  7/10.  Pain is sharp, stabbing, and constant.  Sleep sometimes is poor.  His swelling is a bit better recently with some new socks.  He purchased  that to help control edema.  Pain interferes with general activity,  relation with others, enjoyment of life on a moderate level.   REVIEW OF SYSTEMS:  Notable for the above.  He does report some urinary  retention as well as numbness still in the lower extremity, depression,  and anxiety.  He seems to have gotten over his mother's death and to a  large extent, but still is very emotional at times.  Full review is in  the written health and history section of the chart.   SOCIAL HISTORY:  The patient is married and living with his wife and 3  children.  He smokes 2 packs of cigarettes per day.   PHYSICAL EXAMINATION:  VITAL SIGNS:  Blood pressure is 142/82, pulse is  109, respiratory rate 18.  He is sating 95% on room air.  GENERAL:  The patient is pleasant, alert, and oriented x3.  Affect is  bright and appropriate.  Weight is unchanged.  EXTREMITIES:  His swelling is better in both ankles today.  He continues  to have decreased sensation in the peroneal distribution on the right.  He walks with the right leg externally rotated, but less antalgia  overall.  Color and pulses are good.  Scar noted on lower extremities.  HEART:  Tachycardic.  CHEST:  Clear.  ABDOMEN:  Soft, nontender.   ASSESSMENT:  1. History of traumatic brain injury.  2. Chronic pain syndrome with right peroneal nerve injury.  3. Reactive mood disorder.  4. Left anterior cruciate ligament injury.  5. Right foot trauma.   PLAN:  1. We will recheck vitamin D level in about a month.  The patient is      taking approximately 4000 units a day.  2. Discussed long-term  plans of weaning medications and the patient      seems to be in an agreement.  I would like to see him start      decreasing his Percocet.  For now refilled OxyContin 80 mg q.8 h      #90, Percocet 10 q.8 h #90.  3. Continue activities as noted previously.  4. We will see him back in 1 month with nursing in 3 months and with      me.      Ranelle Oyster, M.D.  Electronically Signed     ZTS/MedQ  D:  12/15/2008 11:54:31  T:  12/15/2008 23:53:23  Job #:  [id number]

## 2010-09-14 NOTE — Assessment & Plan Note (Signed)
The patient is back regarding his chronic lower extremity pain.  He  received disability.  That has taken a large emotional pressure off his  shoulders.  He has been more active with his family.  He has been  exercising.  He was out in the yard working a little bit last week in  fact.  Pain is still 8/10.  He describes it as burning, sharp, constant,  and tingling.  Pain interferes with his general activity, relations with  others, and enjoyment of life on a moderate level.  Sleep is fair-to-  poor still.  He remains on OxyContin 80 mg q.8 h., Percocet 10 for  breakthrough pain.  He also uses Lyrica, Restoril, and Lidoderm patches.   REVIEW OF SYSTEMS:  Notable for tingling, numbness, spasms, anxiety,  limb swelling, coughing, shortness of breath, and night sweats.  Full  review is in the written health and history section in the chart.   SOCIAL HISTORY:  Without specific change.  He is living with his wife  and his mother is currently is at the house, but she is receiving  treatment for her cancer.  That has been stressful at times for him.   PHYSICAL EXAMINATION:  VITAL SIGNS:  Blood pressure is 164/87, pulse is  84, respiratory rate 18, and he is saturating 97% on room air.  GENERAL:  The patient is pleasant, alert and oriented x3.  Affect is  generally bright and appropriate.  Weight is stable.  He continues to  have decreased sensation and movement over the right foot particularly  in the dorsal compartment.  Pulses are 2+.  The skin color is good.  Affect is generally appropriate.  HEART:  Regular.  CHEST:  Clear.  ABDOMEN:  Soft and nontender.  Weight may be a bit decreased.   ASSESSMENT:  1. History of traumatic brain injury.  2. Right peroneal nerve injury.  3. Left ankle wound.  4. Left anterior cruciate ligament injury and repair.   PLAN:  1. Encourage the patient to be active physically.  I want to see him      socially and vocationally active as well.  2. Refill the  OxyContin and Percocet.  I would like to see these      decreased overtime.  We will check a testosterone level as well as      CMET and CBC today.  3. I will see him back in about 3 months with nurse clinic followup in      1 month.      Ranelle Oyster, M.D.  Electronically Signed     ZTS/MedQ  D:  01/14/2008 10:14:54  T:  01/15/2008 01:40:11  Job #:  [id number]

## 2010-09-14 NOTE — Assessment & Plan Note (Signed)
Patient is back regarding his chronic pain.  He was unable to get his  TENS unit due to Medicaid rejecting this.  Pain remains about an 8/10.  He has been more active with his family and with things around the home.  He has not begun looking at a job per Geologist, engineering  recommendations.  He remains on OxyContin and Percocet for control.  Using Lyrica and Lidoderm as well.  Pain is most pronounced around his  right ankle and foot, as well as the left knee to a lesser extent.  No  new orthopedic plans have been made.   REVIEW OF SYSTEMS:  Notable for spasms, trouble walking, tingling,  numbness, anxiety, night sweats, limb swelling.  Other pertinent  positives listed above, and full review is in the written health and  history section in the chart.   SOCIAL HISTORY:  The patient is single, living with fiancee and three  children.  He plans to be married early May.  He is still smoking 1-1/2  packs of cigarettes per day, but is trying to cut back.   PHYSICAL EXAMINATION:  Blood pressure is 146/90, pulse 78, respiratory  rate 20, satting 99% on room air.  Patient is pleasant, alert nd  oriented x3.  Strength is still preserved at 4/5 right ankle, 5/5  elsewhere right leg.  Left side he is 4+ to 5/5.  Sensation is still  decreased over the dorsum of the foot and right leg.  Surgical wounds  are noted.  Pulses are 2+.  Affect is bright.  He seems more animated  and more assertive than I have seen him previously.  HEART:  Regular.  CHEST:  Clear.  ABDOMEN:  Soft, nontender.   ASSESSMENT:  1. TBI with polytrauma.  2. Right peroneal nerve injury.  3. Left ankle wound.  4. Left ACL injury and repair.   PLAN:  1. We will research patient's issues with TENS unit to see if we can      find approval.  2. OxyContin and Percocet for breakthrough pain.  3. Encouraged activities of volunteer or part time sedentary work.  He      needs to continue to focus on other things rather than his  pain.  I      do think his focus and activities as a whole have improved.  4. Continue smoking cessation.  5. See patient back in three months with nurse clinic followup in one      month's time.      Ranelle Oyster, M.D.  Electronically Signed     ZTS/MedQ  D:  08/03/2007 14:50:01  T:  08/03/2007 16:10:24  Job #:  [id number]

## 2010-09-14 NOTE — Assessment & Plan Note (Signed)
HISTORY:  The patient is back regarding his chronic pain.  He had  disability hearing last week which he states did not go very well.  He  is not expecting to receive disability.  He still has not sought any job  or vocational activities at this point, although he spoke to the  Geologist, engineering on one occasion.  He has got a lot of stress in his  life regarding his mother's cancer.  He takes care of his kids.  He  rates his pain at 8/10 and describes it as burning, stabbing, tingling,  and constant.  Pain is mostly prominent over the left knee and  especially the right foot.  Pain interferes with his general activity,  relations with others, enjoyment of life on a moderate-to-severe level.  Sleep is poor.  He has been seeking some counseling closer to home, but  his physician has not been able to find anybody who accepts Medicaid  locally.   REVIEW OF SYSTEMS:  Notable for numbness, tingling, trouble walking,  spasms, depression, anxiety, night sweats, limb swelling, coughing, and  shortness of breath.  Full review is written out in history section in  the chart.   SOCIAL HISTORY:  The patient is married and lives with his wife and  children.  He smokes 1 pack of cigarettes per day.   PHYSICAL EXAMINATION:  VITAL SIGNS:  Blood pressure 157/93, pulse is 95,  respiratory rate 18, and he is satting 98% on room air.  GENERAL:  The patient is pleasant, a bit anxious but alert and oriented.  STRENGTH:  Generally 4/5 at the ankle and 5/5 elsewhere.  He has  decreased sensation over the distal right foot particularly in the  anterior compartment.  Surgical scars are noted.  Pulse is 2+.  Affect  is generally appropriate, otherwise.  HEART:  Regular.  CHEST:  Clear.  ABDOMEN:  Soft and nontender.  Weight is increased, but stable.   ASSESSMENT:  1. Traumatic brain injury following a trauma.  2. Right peroneal nerve injury.  3. Left ankle wound.  4. Left anterior cruciate ligament  injury and repair.   PLAN:  1. I want the patient to be in vocational efforts to look at the      entry.  He is capable of sedentary work, at least part time.  He      needs to move forward with his life.  2. The patient needs to seek smoking cessation assistance if he cannot      do on his own.  3. I refilled his OxyContin and Percocet today.  4. Recommended outpatient psychological counseling.  We have a      practitioner who accepts Medicaid here in [place].  If he cannot      find someone locally, I am happy to make that referal. I do      understand he is under a lot of stress in his life at this moment.  5. I will see him back in 3 months with nurse clinic followup in 1      month's time.      Ranelle Oyster, M.D.  Electronically Signed     ZTS/MedQ  D:  10/24/2007 13:53:24  T:  10/25/2007 08:19:43  Job #:  [id number]

## 2010-09-14 NOTE — Assessment & Plan Note (Signed)
Brian Caldwell is back regarding his chronic lower extremity pain.  He has  complained of a little more left knee pain as of late.  He notes some  pain particularly when he bears weight on the knee.  He reports pain  around the kneecap as well.  He seems to be getting over the passing of  his mother, but recently learned that his grandmother died, and this is  affecting him once again,  but not quite as badly as he expected her  passing.  He rates his pain as 7-8/10, described it as sharp, burning,  constant, and tingling.  The pain interferes with general activity,  relations with others, and enjoyment of life on a moderate level.  Sleep  is fair-to-poor.  It has been limited still with activity.  We began him  on testosterone supplementation after last visit .  After his  testosterone was seen substantially low, he began an Androderm patch 2.5  mg nightly.   REVIEW OF SYSTEMS:  Notable for numbness, tremor, tingling, trouble  walking, spasms, depression, coughing, and swelling.  Other pertinent  positives are above and full review is in the written health history  section of the chart.   SOCIAL HISTORY:  Noted above.   PHYSICAL EXAMINATION:  VITAL SIGNS:  Blood pressure is 146/97, pulse is  103, and respiratory rate is 18.  He is sating 98% on room air.  GENERAL:  The patient seems to be much brighter than his last visit.  Weight was stable to decrease.  He walks with cane for support.  EXTREMITIES:  His left knee is painful along the insertion of the  patellar tendon on the tibia.  No gross swelling was appreciated.  Sensation was a bit decreased distally in the right foot.  Pulses were  2+, however, skin was intact.  The patient has some pain still with  resisted movements of the ankles, but had much improved range of motion  overall and I saw no focal swelling or discoloration in either  extremity.  HEART:  Regular.  CHEST:  Clear.  ABDOMEN:  Soft and nontender.   ASSESSMENT:  1.  Traumatic brain injury.  2. Right peroneal nerve injury.  3. Reactive mood disorder worsened by the passing of loved ones      recently.  4. Left anterior cruciate ligament injury, status post repair.   PLAN:  1. I recommended following up with Orthopedic Surgery regarding his      knee.  He should work on the meantime on some quad/ knee      strengthening exercises.  2. To continue Wellbutrin XR 200 mg daily.  3. Refill OxyContin 80 mg q.8 h., #90 and Percocet 10 q.8 h. p.r.n.,      #90.  Goal will be to decrease his meds over the next coming      months.  We discussed that at length today.  4. Recheck testosterone level today.  5. I will see him back in about 3 months with nurse clinic follow up      in 1 months' time.      Ranelle Oyster, M.D.  Electronically Signed     ZTS/MedQ  D:  06/25/2008 13:39:56  T:  06/26/2008 01:28:33  Job #:  [id number]

## 2010-09-14 NOTE — Assessment & Plan Note (Signed)
The patient is back regarding his chronic leg pain and peroneal nerve  injury.  He has been more active over the last few months.  We checked  some labs in March and found vitamin D deficiency.  He has been on  vitamin D supplementation.  He notices some improvement in his energy  and activity levels.  Pain is 7/10 today and described as sharp burning,  constant aching.  He remains on his OxyContin and Percocet as previously  prescribed.  Pain interferes with general activity, relations with  others, enjoyment of life on a moderate level.  He finds more pain when  he does walk and perform activities outside the house.  He does try to  keep active doing yard work, housework, and other activities such as  mowing the grass.  He does note swelling after performing activity,  usually this is associated with more pain.   REVIEW OF SYSTEMS:  Notable for bladder control problem, weakness,  numbness, tremor, tingling, spasms, trouble walking, depression,  anxiety, coughing.  Other pertinent positives are above.  Full review is  in the written health and history section of the chart.   SOCIAL HISTORY:  The patient is married, living with his wife and kids.   PHYSICAL EXAMINATION:  VITAL SIGNS:  Blood pressure 160/96, pulse 62,  respiratory rate 18, and he is sating 95% on room air.  GENERAL:  The patient is pleasant, alert, and oriented x3.  Affect  showing bright and appropriate.  Weight is generally stable.  EXTREMITIES:  He has some mild swelling of either ankle, more in the  right than left.  Sensation remains diminished on the right foot in the  peroneal distribution.  Pulses are 2+.  He remains antalgic on either  leg.  SKIN:  Scarring, but is intact.  HEART:  Regular.  CHEST:  Clear.  ABDOMEN:  Soft and nontender.   ASSESSMENT:  1. Traumatic brain injury.  2. Chronic pain, peroneal nerve injury.  3. Reactive mood disorder.  4. History of left ACL injury, status post repair.  5.  Right foot trauma.   PLAN:  1. Continue with current plan.  We will check vitamin D level today.      Hopefully we can back off supplementation at this point, especially      now that he's getting adequate sunlight.  2. Refill OxyContin 80 mg q.8 h., #90 and Percocet 10 q.8 h. p.r.n.      #90.  We again like to look at weaning some of his pain medication      in the next few months.  3. Maintain activity as tolerated.  We discussed some strategies to      decrease inflammation and swelling such as use of prophylactic anti-      inflammatories, ankle wraps, ankle splinting, etc.  I do not want      him to stop his activity that he has initiated now.  4. We will see him back in 3 months with 1 month followup in Nursing      Clinic.     Ranelle Oyster, M.D.  Electronically Signed    ZTS/MedQ  D:  09/17/2008 11:34:46  T:  09/18/2008 00:36:54  Job #:  [id number]
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
## 2011-06-20 ENCOUNTER — Other Ambulatory Visit: Payer: Self-pay

## 2011-06-20 ENCOUNTER — Encounter (HOSPITAL_COMMUNITY): Payer: Self-pay | Admitting: *Deleted

## 2011-06-20 ENCOUNTER — Emergency Department (HOSPITAL_COMMUNITY): Payer: Medicaid Other

## 2011-06-20 ENCOUNTER — Inpatient Hospital Stay (HOSPITAL_COMMUNITY)
Admission: EM | Admit: 2011-06-20 | Discharge: 2011-06-25 | DRG: 249 | Disposition: A | Discharge status: Home / Self Care | Payer: Medicaid Other | Attending: Cardiovascular Disease | Admitting: Cardiovascular Disease

## 2011-06-20 DIAGNOSIS — Z720 Tobacco use: Secondary | ICD-10-CM | POA: Diagnosis present

## 2011-06-20 DIAGNOSIS — M549 Dorsalgia, unspecified: Secondary | ICD-10-CM | POA: Diagnosis present

## 2011-06-20 DIAGNOSIS — Z880 Allergy status to penicillin: Secondary | ICD-10-CM

## 2011-06-20 DIAGNOSIS — G8929 Other chronic pain: Secondary | ICD-10-CM | POA: Diagnosis present

## 2011-06-20 DIAGNOSIS — Z886 Allergy status to analgesic agent status: Secondary | ICD-10-CM

## 2011-06-20 DIAGNOSIS — M79609 Pain in unspecified limb: Secondary | ICD-10-CM | POA: Diagnosis present

## 2011-06-20 DIAGNOSIS — M129 Arthropathy, unspecified: Secondary | ICD-10-CM | POA: Diagnosis present

## 2011-06-20 DIAGNOSIS — Z23 Encounter for immunization: Secondary | ICD-10-CM

## 2011-06-20 DIAGNOSIS — R079 Chest pain, unspecified: Secondary | ICD-10-CM | POA: Diagnosis present

## 2011-06-20 DIAGNOSIS — D72829 Elevated white blood cell count, unspecified: Secondary | ICD-10-CM | POA: Diagnosis present

## 2011-06-20 DIAGNOSIS — I739 Peripheral vascular disease, unspecified: Secondary | ICD-10-CM | POA: Diagnosis present

## 2011-06-20 DIAGNOSIS — S2502XA Major laceration of thoracic aorta, initial encounter: Secondary | ICD-10-CM | POA: Diagnosis not present

## 2011-06-20 DIAGNOSIS — I214 Non-ST elevation (NSTEMI) myocardial infarction: Principal | ICD-10-CM

## 2011-06-20 DIAGNOSIS — Z955 Presence of coronary angioplasty implant and graft: Secondary | ICD-10-CM

## 2011-06-20 DIAGNOSIS — I2510 Atherosclerotic heart disease of native coronary artery without angina pectoris: Secondary | ICD-10-CM | POA: Diagnosis present

## 2011-06-20 DIAGNOSIS — I2582 Chronic total occlusion of coronary artery: Secondary | ICD-10-CM | POA: Diagnosis present

## 2011-06-20 DIAGNOSIS — Z87820 Personal history of traumatic brain injury: Secondary | ICD-10-CM

## 2011-06-20 DIAGNOSIS — I10 Essential (primary) hypertension: Secondary | ICD-10-CM

## 2011-06-20 DIAGNOSIS — I249 Acute ischemic heart disease, unspecified: Secondary | ICD-10-CM

## 2011-06-20 DIAGNOSIS — I519 Heart disease, unspecified: Secondary | ICD-10-CM | POA: Diagnosis present

## 2011-06-20 DIAGNOSIS — Z888 Allergy status to other drugs, medicaments and biological substances status: Secondary | ICD-10-CM | POA: Diagnosis present

## 2011-06-20 DIAGNOSIS — F17200 Nicotine dependence, unspecified, uncomplicated: Secondary | ICD-10-CM | POA: Diagnosis present

## 2011-06-20 HISTORY — DX: Major laceration of thoracic aorta, initial encounter: S25.02XA

## 2011-06-20 HISTORY — DX: Unspecified osteoarthritis, unspecified site: M19.90

## 2011-06-20 HISTORY — DX: Unspecified mononeuropathy of right lower limb: G57.91

## 2011-06-20 LAB — BASIC METABOLIC PANEL
BUN: 9 mg/dL (ref 6–23)
Creatinine, Ser: 0.72 mg/dL (ref 0.50–1.35)
GFR calc Af Amer: >90 mL/min (ref >90)
GFR calc non Af Amer: >90 mL/min (ref >90)

## 2011-06-20 LAB — CBC
HCT: 48.6 % (ref 39.0–52.0)
Hemoglobin: 17.1 g/dL — ABNORMAL HIGH (ref 13.0–17.0)
MCH: 32.5 pg (ref 26.0–34.0)
MCHC: 35.2 g/dL (ref 30.0–36.0)

## 2011-06-20 LAB — DIFFERENTIAL
Basophils Relative: 0 % (ref 0–1)
Eosinophils Absolute: 0 10*3/uL (ref 0.0–0.7)
Monocytes Absolute: 0.9 10*3/uL (ref 0.1–1.0)
Monocytes Relative: 5 % (ref 3–12)

## 2011-06-20 MED ORDER — NITROGLYCERIN IN D5W 200-5 MCG/ML-% IV SOLN
5.0000 ug/min | Freq: Once | INTRAVENOUS | Status: AC
  Administered 2011-06-20: 5 ug/min via INTRAVENOUS
  Filled 2011-06-20: qty 250

## 2011-06-20 NOTE — ED Notes (Signed)
Spoke with August Saucer, pharmacist regarding heparin per pharmacy consult. Gave bennie patient's weight and according to weight, bennie stated that patient was to receive a 4,000 unit bolus of heparin and then 1,500 units per hour. Also stated that patient would need to have heparin level drawn at 0600 am on 06/21/11.

## 2011-06-20 NOTE — ED Notes (Signed)
RN is in with the patient at this time.
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
## 2011-06-20 NOTE — ED Provider Notes (Signed)
History     CSN: [id number]  Arrival date & time 06/20/11  2136   First MD Initiated Contact with Patient 06/20/11 2315      Chief Complaint  Patient presents with  . Chest Pain    (Consider location/radiation/quality/duration/timing/severity/associated sxs/prior treatment) Patient is a 36 y.o. male presenting with chest pain. The history is provided by the patient.  Chest Pain   He started having mild chest pain yesterday which got worse today. Pain is a burning sensation in his left chest which does not radiate. There is sometimes associated dyspnea when pain gets severe, and he vomited once. He denies diaphoresis. Current pain is rated at 6/10, but it was 9/10 at its worst. It is worse if he coughs and worse if he stretches, but sometimes it's better if he stretches. He does not had pain like this before. He thought it might have been indigestion he tried taking an antacid which did not help. He denies cocaine use.he does smoke half-pack of cigarettes a day. There is no history of diabetes, hypertension, or elevated cholesterol.  History reviewed. No pertinent past medical history.  Past Surgical History  Procedure Date  . Repair thoracic aorta   . Orthopedic surgery     No family history on file.  History  Substance Use Topics  . Smoking status: Current Everyday Smoker  . Smokeless tobacco: Not on file  . Alcohol Use: Yes      Review of Systems  Cardiovascular: Positive for chest pain.  All other systems reviewed and are negative.    Allergies  Aspirin; Penicillins; and Tape  Home Medications  No current outpatient prescriptions on file.  BP 149/105  Pulse 109  Resp 18  SpO2 98%  Physical Exam  Nursing note and vitals reviewed. 36 year old male who is resting comfortably and in no acute distress. Vital signs show mild hypertension with blood pressure 149/105 and mild tachycardia with heart rate 109. Oxygen saturation is 98% which is normal. Head is  normocephalic and atraumatic. PERRLA, EOMI. Oropharynx is clear. Neck is nontender and supple without adenopathy or JVD. Back is nontender. Lungs are clear without rales, wheezes, rhonchi. Heart has regular rate and rhythm without murmur. Abdomen soft, flat, nontender without masses or hepatosplenomegaly. Extremities have no cyanosis or edema, full range of motion is present. Skin is warm and dry without rash. Neurologic: Mental status is normal, cranial nerves are intact, there no focal motor or sensory deficits.  ED Course  Procedures (including critical care time)  Labs Reviewed  CBC - Abnormal; Notable for the following:    WBC 17.9 (*)    Hemoglobin 17.1 (*)    All other components within normal limits  DIFFERENTIAL - Abnormal; Notable for the following:    Neutrophils Relative 82 (*)    Neutro Abs 14.8 (*)    All other components within normal limits  POCT I-STAT TROPONIN I - Abnormal; Notable for the following:    Troponin i, poc 0.37 (*)    All other components within normal limits  BASIC METABOLIC PANEL   Dg Chest Portable 1 View  06/20/2011  *RADIOLOGY REPORT*  Clinical Data: Medial chest pain, worsened tonight; history of smoking.  PORTABLE CHEST - 1 VIEW  Comparison: None.  Findings: The lungs are well-aerated.  Vascular congestion is noted, without significant pulmonary edema.  There is no evidence of focal opacification, pleural effusion or pneumothorax.  The cardiomediastinal silhouette is within normal limits.  No acute osseous abnormalities are seen.  Displaced rib fractures involving the left third, fourth and fifth lateral ribs, particularly displaced at the left fifth rib, appear to be chronic in nature.  IMPRESSION:  1.  Vascular congestion, without significant pulmonary edema. Lungs remain grossly clear. 2.  Chronic displaced rib fractures noted, involving the left third, fourth and fifth lateral ribs.  Original Report Authenticated By: Tonia Ghent, M.D.    Date:  06/20/2011  Rate: 98  Rhythm: normal sinus rhythm  QRS Axis: normal  Intervals: normal  ST/T Wave abnormalities: ST depressions inferiorly and ST depressions laterally  Conduction Disutrbances:none. No old ECG available for comparison.  Narrative Interpretation: anterior Q waves consistent with old anteroseptal MI, and ST depression in the inferior, lateral, and anterolateral leads concerning for ischemia  Old EKG Reviewed: none available  Cardiac markers have come back elevated. He is given morphine for pain, and started on IV nitroglycerin and heparin, and arrangements will be made to transfer him to the [facility]. Aspirin is not given because of allergy.  2340: case is discussed with Dr. Sallyanne Kuster of cardiology service at [facility] who agrees to accept the patient in transfer. He'll be admitted to step down bed on the service of Dr. Donnie Aho.  0120: he has been having ongoing pain which has not been relieved with nitroglycerin drip and is at 8 mg of morphine. I have discussed case with Dr. Sallyanne Kuster again and he will be moved to CCU instead of a step down bed. Initial dose of Plavix is given.  1. Acute coronary syndrome   2. Hypertension     CRITICAL CARE Performed by: Dione Booze   Total critical care time: 150 minutes  Critical care time was exclusive of separately billable procedures and treating other patients.  Critical care was necessary to treat or prevent imminent or life-threatening deterioration.  Critical care was time spent personally by me on the following activities: development of treatment plan with patient and/or surrogate as well as nursing, discussions with consultants, evaluation of patient's response to treatment, examination of patient, obtaining history from patient or surrogate, ordering and performing treatments and interventions, ordering and review of laboratory studies, ordering and review of radiographic studies, pulse oximetry and  re-evaluation of patient's condition.   MDM  Chest pain with abnormal ECG worrisome for acute coronary syndrome.        Dione Booze, MD 06/21/11 [phone]

## 2011-06-20 NOTE — ED Notes (Signed)
Chest pain onset yesterday 

## 2011-06-21 ENCOUNTER — Encounter (HOSPITAL_COMMUNITY): Payer: Self-pay | Admitting: *Deleted

## 2011-06-21 ENCOUNTER — Other Ambulatory Visit: Payer: Self-pay

## 2011-06-21 ENCOUNTER — Encounter (HOSPITAL_COMMUNITY)
Admission: EM | Disposition: A | Discharge status: Home / Self Care | Payer: Self-pay | Source: Home / Self Care | Attending: Cardiovascular Disease

## 2011-06-21 ENCOUNTER — Inpatient Hospital Stay (HOSPITAL_COMMUNITY): Payer: Medicaid Other

## 2011-06-21 DIAGNOSIS — Z720 Tobacco use: Secondary | ICD-10-CM | POA: Diagnosis present

## 2011-06-21 DIAGNOSIS — R079 Chest pain, unspecified: Secondary | ICD-10-CM

## 2011-06-21 HISTORY — PX: LEFT HEART CATHETERIZATION WITH CORONARY ANGIOGRAM: SHX5451

## 2011-06-21 LAB — CARDIAC PANEL(CRET KIN+CKTOT+MB+TROPI)
CK, MB: 43.3 ng/mL (ref 0.3–4.0)
CK, MB: 91.6 ng/mL (ref 0.3–4.0)
Relative Index: 10.4 — ABNORMAL HIGH (ref 0.0–2.5)
Relative Index: 10.8 — ABNORMAL HIGH (ref 0.0–2.5)
Relative Index: 8.9 — ABNORMAL HIGH (ref 0.0–2.5)
Total CK: 1222 U/L — ABNORMAL HIGH (ref 7–232)
Total CK: 486 U/L — ABNORMAL HIGH (ref 7–232)
Total CK: 878 U/L — ABNORMAL HIGH (ref 7–232)
Troponin I: 5.05 ng/mL (ref <0.30)
Troponin I: 24.59 ng/mL (ref <0.30)

## 2011-06-21 LAB — TSH: TSH: 2.093 u[IU]/mL (ref 0.350–4.500)

## 2011-06-21 LAB — MAGNESIUM: Magnesium: 1.9 mg/dL (ref 1.5–2.5)

## 2011-06-21 LAB — POCT ACTIVATED CLOTTING TIME: Activated Clotting Time: 441 seconds

## 2011-06-21 LAB — CBC
Hemoglobin: 15.4 g/dL (ref 13.0–17.0)
RBC: 4.82 MIL/uL (ref 4.22–5.81)
WBC: 15.5 10*3/uL — ABNORMAL HIGH (ref 4.0–10.5)

## 2011-06-21 LAB — LIPID PANEL: Cholesterol: 168 mg/dL (ref 0–200)

## 2011-06-21 LAB — HEMOGLOBIN A1C: Mean Plasma Glucose: 108 mg/dL (ref <117)

## 2011-06-21 LAB — MRSA PCR SCREENING: MRSA by PCR: NEGATIVE

## 2011-06-21 LAB — D-DIMER, QUANTITATIVE: D-Dimer, Quant: 0.41 ug/mL-FEU (ref 0.00–0.48)

## 2011-06-21 LAB — PROTIME-INR: Prothrombin Time: 13 seconds (ref 11.6–15.2)

## 2011-06-21 LAB — GLUCOSE, CAPILLARY: Glucose-Capillary: 116 mg/dL — ABNORMAL HIGH (ref 70–99)

## 2011-06-21 SURGERY — LEFT HEART CATHETERIZATION WITH CORONARY ANGIOGRAM
Anesthesia: LOCAL

## 2011-06-21 MED ORDER — MORPHINE SULFATE 4 MG/ML IJ SOLN
4.0000 mg | INTRAMUSCULAR | Status: DC | PRN
  Administered 2011-06-21: 4 mg via INTRAVENOUS

## 2011-06-21 MED ORDER — MORPHINE SULFATE 4 MG/ML IJ SOLN
INTRAMUSCULAR | Status: AC
  Filled 2011-06-21: qty 1

## 2011-06-21 MED ORDER — LIDOCAINE HCL (PF) 1 % IJ SOLN
INTRAMUSCULAR | Status: AC
  Filled 2011-06-21: qty 30

## 2011-06-21 MED ORDER — ACETAMINOPHEN 325 MG PO TABS
650.0000 mg | ORAL_TABLET | ORAL | Status: DC | PRN
  Administered 2011-06-23 – 2011-06-24 (×3): 650 mg via ORAL
  Filled 2011-06-21 (×3): qty 2

## 2011-06-21 MED ORDER — SODIUM CHLORIDE 0.9 % IV SOLN
INTRAVENOUS | Status: AC
  Administered 2011-06-21: 10:00:00 via INTRAVENOUS

## 2011-06-21 MED ORDER — ONDANSETRON HCL 4 MG/2ML IJ SOLN
4.0000 mg | Freq: Four times a day (QID) | INTRAMUSCULAR | Status: DC | PRN
  Administered 2011-06-21: 4 mg via INTRAVENOUS
  Filled 2011-06-21: qty 2

## 2011-06-21 MED ORDER — NITROGLYCERIN IN D5W 200-5 MCG/ML-% IV SOLN: 2.0000 ug/min | INTRAVENOUS | Status: DC

## 2011-06-21 MED ORDER — INFLUENZA VIRUS VACC SPLIT PF IM SUSP
0.5000 mL | INTRAMUSCULAR | Status: AC
  Administered 2011-06-23: 0.5 mL via INTRAMUSCULAR
  Filled 2011-06-21: qty 0.5

## 2011-06-21 MED ORDER — ACETAMINOPHEN 325 MG PO TABS: 650.0000 mg | ORAL_TABLET | ORAL | Status: DC | PRN

## 2011-06-21 MED ORDER — SODIUM CHLORIDE 0.9 % IJ SOLN
3.0000 mL | Freq: Two times a day (BID) | INTRAMUSCULAR | Status: DC
  Administered 2011-06-21: 3 mL via INTRAVENOUS

## 2011-06-21 MED ORDER — PNEUMOCOCCAL VAC POLYVALENT 25 MCG/0.5ML IJ INJ
0.5000 mL | INJECTION | INTRAMUSCULAR | Status: AC
  Administered 2011-06-23: 0.5 mL via INTRAMUSCULAR
  Filled 2011-06-21: qty 0.5

## 2011-06-21 MED ORDER — IOHEXOL 300 MG/ML  SOLN
100.0000 mL | Freq: Once | INTRAMUSCULAR | Status: AC | PRN
  Administered 2011-06-21: 100 mL via INTRAVENOUS

## 2011-06-21 MED ORDER — HEPARIN SOD (PORCINE) IN D5W 100 UNIT/ML IV SOLN
1500.0000 [IU]/h | Freq: Once | INTRAVENOUS | Status: AC
Start: 2011-06-21 — End: 2011-06-21
  Administered 2011-06-21: 1500 [IU]/h via INTRAVENOUS

## 2011-06-21 MED ORDER — MORPHINE SULFATE 4 MG/ML IJ SOLN
4.0000 mg | Freq: Once | INTRAMUSCULAR | Status: AC
  Administered 2011-06-21: 4 mg via INTRAVENOUS
  Filled 2011-06-21: qty 1

## 2011-06-21 MED ORDER — METOPROLOL TARTRATE 1 MG/ML IV SOLN
INTRAVENOUS | Status: AC
  Filled 2011-06-21: qty 5

## 2011-06-21 MED ORDER — MORPHINE SULFATE 2 MG/ML IJ SOLN
1.0000 mg | INTRAMUSCULAR | Status: DC | PRN
  Administered 2011-06-21 – 2011-06-22 (×5): 1 mg via INTRAVENOUS
  Filled 2011-06-21 (×4): qty 1

## 2011-06-21 MED ORDER — FENTANYL CITRATE 0.05 MG/ML IJ SOLN
INTRAMUSCULAR | Status: AC
  Filled 2011-06-21: qty 2

## 2011-06-21 MED ORDER — ROSUVASTATIN CALCIUM 40 MG PO TABS
40.0000 mg | ORAL_TABLET | Freq: Every day | ORAL | Status: DC
  Administered 2011-06-21 – 2011-06-24 (×3): 40 mg via ORAL
  Filled 2011-06-21 (×5): qty 1

## 2011-06-21 MED ORDER — SODIUM CHLORIDE 0.9 % IV SOLN: 250.0000 mL | INTRAVENOUS | Status: DC | PRN

## 2011-06-21 MED ORDER — CLOPIDOGREL BISULFATE 300 MG PO TABS
600.0000 mg | ORAL_TABLET | Freq: Once | ORAL | Status: AC
  Administered 2011-06-21: 600 mg via ORAL
  Filled 2011-06-21: qty 2

## 2011-06-21 MED ORDER — HEPARIN BOLUS VIA INFUSION
4000.0000 [IU] | Freq: Once | INTRAVENOUS | Status: AC
  Administered 2011-06-21: 4000 [IU] via INTRAVENOUS

## 2011-06-21 MED ORDER — HEPARIN SOD (PORCINE) IN D5W 100 UNIT/ML IV SOLN
1500.0000 [IU]/h | INTRAVENOUS | Status: DC
  Filled 2011-06-21 (×2): qty 250

## 2011-06-21 MED ORDER — METOPROLOL TARTRATE 12.5 MG HALF TABLET
12.5000 mg | ORAL_TABLET | Freq: Four times a day (QID) | ORAL | Status: DC
  Filled 2011-06-21 (×6): qty 1

## 2011-06-21 MED ORDER — PRASUGREL HCL 10 MG PO TABS
10.0000 mg | ORAL_TABLET | Freq: Every day | ORAL | Status: DC
  Administered 2011-06-22 – 2011-06-25 (×4): 10 mg via ORAL
  Filled 2011-06-21 (×4): qty 1

## 2011-06-21 MED ORDER — SODIUM CHLORIDE 0.9 % IJ SOLN: 3.0000 mL | INTRAMUSCULAR | Status: DC | PRN

## 2011-06-21 MED ORDER — LISINOPRIL 5 MG PO TABS
5.0000 mg | ORAL_TABLET | Freq: Every day | ORAL | Status: DC
  Administered 2011-06-21 – 2011-06-25 (×5): 5 mg via ORAL
  Filled 2011-06-21 (×5): qty 1

## 2011-06-21 MED ORDER — HEPARIN SOD (PORCINE) IN D5W 100 UNIT/ML IV SOLN
INTRAVENOUS | Status: AC
  Filled 2011-06-21: qty 250

## 2011-06-21 MED ORDER — HEPARIN (PORCINE) IN NACL 2-0.9 UNIT/ML-% IJ SOLN
INTRAMUSCULAR | Status: AC
  Filled 2011-06-21: qty 2000

## 2011-06-21 MED ORDER — METOPROLOL TARTRATE 12.5 MG HALF TABLET
12.5000 mg | ORAL_TABLET | Freq: Two times a day (BID) | ORAL | Status: DC
  Administered 2011-06-21 – 2011-06-22 (×3): 12.5 mg via ORAL
  Filled 2011-06-21 (×4): qty 1

## 2011-06-21 MED ORDER — BIVALIRUDIN 250 MG IV SOLR
INTRAVENOUS | Status: AC
  Filled 2011-06-21: qty 250

## 2011-06-21 MED ORDER — PRASUGREL HCL 10 MG PO TABS
ORAL_TABLET | ORAL | Status: AC
  Filled 2011-06-21: qty 6

## 2011-06-21 MED ORDER — METOPROLOL TARTRATE 1 MG/ML IV SOLN
5.0000 mg | Freq: Once | INTRAVENOUS | Status: AC
  Administered 2011-06-21: 5 mg via INTRAVENOUS

## 2011-06-21 MED ORDER — NICOTINE 14 MG/24HR TD PT24
14.0000 mg | MEDICATED_PATCH | Freq: Every day | TRANSDERMAL | Status: DC
  Administered 2011-06-21 – 2011-06-25 (×5): 14 mg via TRANSDERMAL
  Filled 2011-06-21 (×6): qty 1

## 2011-06-21 MED ORDER — NITROGLYCERIN 0.4 MG SL SUBL: 0.4000 mg | SUBLINGUAL_TABLET | SUBLINGUAL | Status: DC | PRN

## 2011-06-21 MED ORDER — ROSUVASTATIN CALCIUM 10 MG PO TABS
10.0000 mg | ORAL_TABLET | Freq: Every day | ORAL | Status: DC
  Filled 2011-06-21: qty 1

## 2011-06-21 MED ORDER — NITROGLYCERIN 0.2 MG/ML ON CALL CATH LAB
INTRAVENOUS | Status: AC
  Filled 2011-06-21: qty 1

## 2011-06-21 MED ORDER — ONDANSETRON HCL 4 MG/2ML IJ SOLN: 4.0000 mg | Freq: Four times a day (QID) | INTRAMUSCULAR | Status: DC | PRN

## 2011-06-21 MED ORDER — MORPHINE SULFATE 4 MG/ML IJ SOLN
INTRAMUSCULAR | Status: AC
  Administered 2011-06-21: 4 mg via INTRAVENOUS
  Filled 2011-06-21: qty 1

## 2011-06-21 NOTE — Plan of Care (Signed)
Problem: Phase I Progression Outcomes Goal: Aspirin unless contraindicated Outcome: Not Met (add Reason) Allergic to ASA

## 2011-06-21 NOTE — Progress Notes (Signed)
ANTICOAGULATION CONSULT NOTE - Initial Consult  Pharmacy Consult for heparin Indication: chest pain/ACS  Allergies  Allergen Reactions  . Aspirin Hives and Swelling    Airway swelling.  Marland Kitchen Penicillins Hives and Swelling    Airway swelling   . Tape Rash    Clear medical tape. Patient states can use paper tape.    Vital Signs: BP: 143/74 mmHg (02/19 0215) Pulse Rate: 105  (02/19 0215)  Labs:  Basename 06/20/11 2342 06/20/11 2245  HGB -- 17.1*  HCT -- 48.6  PLT -- 211  APTT 33 --  LABPROT 13.0 --  INR 0.96 --  HEPARINUNFRC -- --  CREATININE -- 0.72  CKTOTAL 486* --  CKMB 43.3* --  TROPONINI 2.44* --   Medical History: History reviewed. No pertinent past medical history.  Medications:  Prescriptions prior to admission  Medication Sig Dispense Refill  . Calcium Carbonate Antacid (ANTACID PO) Take 1 tablet by mouth 5 (five) times daily as needed. For possible indigestion       Scheduled:    . clopidogrel  600 mg Oral Once  . heparin      . heparin  1,500 Units/hr Intravenous Once  . heparin  4,000 Units Intravenous Once  . metoprolol tartrate  12.5 mg Oral Q6H  . morphine  4 mg Intravenous Once  . morphine  4 mg Intravenous Once  . nitroGLYCERIN  5-200 mcg/min Intravenous Once  . rosuvastatin  10 mg Oral Daily  . sodium chloride  3 mL Intravenous Q12H   Infusions:    . heparin    . nitroGLYCERIN      Assessment: 35yo male presented to [facility] ED with CP that started yesterday and worsened, described as burning without radiation, CE elevated, EKG c/w old MI and new ischemia, to continue heparin started at [facility].  Goal of Therapy:  Heparin level 0.3-0.7 units/ml   Plan:  Pt rec'd heparin bolus of 4000 units followed by gtt at 1500 units/hr per RPh on call at [facility]; will continue at current rate and monitor heparin levels and CBC.  Colleen Can PharmD BCPS 06/21/2011,3:14 AM

## 2011-06-21 NOTE — ED Notes (Signed)
Brian Caldwell called critical lab values of :  ckmb 43.34 Troponin 2.44
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
## 2011-06-21 NOTE — Consult Note (Signed)
Pt from cath lab, placed on telemetry. VSS upon arrival to unit. R femoral sheath site D&I, no bleeding and no hematoma. 2+ R pedal pulse. Will continue to monitor.

## 2011-06-21 NOTE — Progress Notes (Signed)
Order for sheath removal verified per post procedural orders. Procedure explained to patient and Rt femoral artery access site assessed: level 0, palpable dorsalis pedis and posterior tibial pulses. 6French Sheath removed and manual pressure applied for 20 minutes. Pre, peri, & post procedural vitals: HR 80, RR 17, O2 Sat upper 98, BP 120/80, Pain 0. Distal pulses remained intact after sheath removal. Access site level 0 and dressed with 4X4 gauze and tegaderm.  Ariel, RN confirmed condition of site. Post procedural instructions discussed with return demonstration from patient.
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
## 2011-06-21 NOTE — Progress Notes (Signed)
CRITICAL VALUE ALERT  Critical value received:  CKMB 91.6   Troponin 5.05   Date of notification: 06/21/11  Time of notification: 0542  Critical value read back: yes   Nurse who received alert:  A. Lamar Benes RN  MD notified (1st page):  Dr. Sallyanne Kuster  Time of first page:  0555  MD notified (2nd page):  Time of second page:   Responding MD:  Dr. Donnie Aho   Time MD responded: [phone]   No new orders at this time.

## 2011-06-21 NOTE — Progress Notes (Signed)
Spoke with Dr. Sallyanne Kuster about continuing 8/10 chest pain, unrelieved by increased Nitro gtt (increased from to since 3am) and Morphine 4mg . MD increased frequency of Morphine pushes and ordered a dose of Metoprolol. Awaiting cardiac panel results. Will call lab to find out why lab draw from 0435 have not resulted, and call MD back with results. Thresa Ross RN

## 2011-06-21 NOTE — Progress Notes (Signed)
UR Completed. Simmons, Amery Minasyan F [phone]  

## 2011-06-21 NOTE — Progress Notes (Signed)
Dr. Allyson Sabal called to say he was taking patient to cath lab emergently. Informed patient and had him sign consent. Called his wife and allowed him to speak with her prior to going to cath lab. Cath lab RN arrived shortly after and we wheeled him to procedure room. Pain still 8/10 upon arrival to cath lab. Thresa Ross RN

## 2011-06-21 NOTE — ED Notes (Signed)
Dr. Preston Fleeting notified of patient's critical lab values.

## 2011-06-21 NOTE — H&P (Signed)
    Pt was reexamined and existing H & P reviewed. No changes found.  Runell Gess, MD [facility] 06/21/2011 8:19 AM

## 2011-06-22 ENCOUNTER — Encounter (HOSPITAL_COMMUNITY): Payer: Self-pay | Admitting: Cardiology

## 2011-06-22 DIAGNOSIS — Z955 Presence of coronary angioplasty implant and graft: Secondary | ICD-10-CM

## 2011-06-22 DIAGNOSIS — I519 Heart disease, unspecified: Secondary | ICD-10-CM | POA: Diagnosis present

## 2011-06-22 DIAGNOSIS — S2502XA Major laceration of thoracic aorta, initial encounter: Secondary | ICD-10-CM | POA: Diagnosis not present

## 2011-06-22 DIAGNOSIS — I214 Non-ST elevation (NSTEMI) myocardial infarction: Secondary | ICD-10-CM | POA: Diagnosis present

## 2011-06-22 LAB — CARDIAC PANEL(CRET KIN+CKTOT+MB+TROPI)
Relative Index: 8.5 — ABNORMAL HIGH (ref 0.0–2.5)
Relative Index: 7.2 — ABNORMAL HIGH (ref 0.0–2.5)
Relative Index: 5.9 — ABNORMAL HIGH (ref 0.0–2.5)
Total CK: 924 U/L — ABNORMAL HIGH (ref 7–232)
Troponin I: 24.45 ng/mL (ref <0.30)
Troponin I: >25 ng/mL (ref <0.30)
Troponin I: 11.61 ng/mL (ref <0.30)

## 2011-06-22 LAB — CBC
HCT: 46.2 % (ref 39.0–52.0)
Hemoglobin: 15.3 g/dL (ref 13.0–17.0)
MCV: 95.1 fL (ref 78.0–100.0)
RBC: 4.86 MIL/uL (ref 4.22–5.81)
WBC: 13.7 10*3/uL — ABNORMAL HIGH (ref 4.0–10.5)

## 2011-06-22 LAB — URINALYSIS, ROUTINE W REFLEX MICROSCOPIC
Hgb urine dipstick: NEGATIVE
Leukocytes, UA: NEGATIVE
Nitrite: NEGATIVE
Specific Gravity, Urine: 1.023 (ref 1.005–1.030)
Urobilinogen, UA: 0.2 mg/dL (ref 0.0–1.0)

## 2011-06-22 LAB — BASIC METABOLIC PANEL
BUN: 8 mg/dL (ref 6–23)
CO2: 28 mEq/L (ref 19–32)
Chloride: 103 mEq/L (ref 96–112)
Creatinine, Ser: 0.78 mg/dL (ref 0.50–1.35)
Glucose, Bld: 93 mg/dL (ref 70–99)
Potassium: 3.9 mEq/L (ref 3.5–5.1)

## 2011-06-22 MED ORDER — METOPROLOL TARTRATE 25 MG PO TABS
25.0000 mg | ORAL_TABLET | Freq: Two times a day (BID) | ORAL | Status: DC
  Administered 2011-06-22: 25 mg via ORAL
  Filled 2011-06-22 (×3): qty 1

## 2011-06-22 MED ORDER — ASPIRIN 81 MG PO CHEW
CHEWABLE_TABLET | ORAL | Status: AC
  Filled 2011-06-22: qty 4

## 2011-06-22 MED ORDER — ALPRAZOLAM 0.5 MG PO TABS: 0.5000 mg | ORAL_TABLET | Freq: Three times a day (TID) | ORAL | Status: DC | PRN

## 2011-06-22 MED ORDER — ALPRAZOLAM 0.5 MG PO TABS
0.5000 mg | ORAL_TABLET | ORAL | Status: DC | PRN
  Administered 2011-06-22 – 2011-06-24 (×5): 0.5 mg via ORAL
  Filled 2011-06-22 (×5): qty 1

## 2011-06-22 MED ORDER — METOPROLOL TARTRATE 25 MG PO TABS
25.0000 mg | ORAL_TABLET | Freq: Once | ORAL | Status: AC
  Administered 2011-06-22: 12.5 mg via ORAL
  Filled 2011-06-22: qty 1

## 2011-06-22 MED FILL — Dextrose Inj 5%: INTRAVENOUS | Qty: 50 | Status: AC

## 2011-06-22 NOTE — Plan of Care (Signed)
Problem: Phase III Progression Outcomes Goal: Vascular site scale level 0 - I Vascular Site Scale Level 0: No bruising/bleeding/hematoma Level I (Mild): Bruising/Ecchymosis, minimal bleeding/ooozing, palpable hematoma < 3 cm Level II (Moderate): Bleeding not affecting hemodynamic parameters, pseudoaneurysm, palpable hematoma > 3 cm  Outcome: Completed/Met Date Met:  06/22/11 Level 0

## 2011-06-22 NOTE — Progress Notes (Signed)
  Echocardiogram 2D Echocardiogram has been performed.  Brian Caldwell 06/22/2011, 5:01 PM

## 2011-06-22 NOTE — Progress Notes (Signed)
CARDIAC REHAB PHASE I   PRE:  Rate/Rhythm: 115 ST, 126 with movement    BP: sitting 127/89    SaO2: 96  RA  MODE:  Ambulation: 300 ft   POST:  Rate/Rhythm: 116 ST    BP: sitting 146/93    SaO2: 95 RA  Began ed with pt and wife. Pt anxious and having ST. Nervous UU:[id number] cessation. Ambulated without difficulty. Only c/o anxiety. No CP or SOB. Return to chair with BP up, 146/93. [id number]  Harriet Masson CES, ACSM

## 2011-06-22 NOTE — Progress Notes (Signed)
Provided patient with booklet about his stent and his stent card. Stressed importance of keeping card in his wallet and of informing any healthcare provider he sees of his stent placement. Thresa Ross RN
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
## 2011-06-23 ENCOUNTER — Other Ambulatory Visit: Payer: Self-pay

## 2011-06-23 LAB — LIPID PANEL
HDL: 34 mg/dL — ABNORMAL LOW (ref >39)
Total CHOL/HDL Ratio: 4.1 RATIO
VLDL: 48 mg/dL — ABNORMAL HIGH (ref 0–40)

## 2011-06-23 LAB — CBC
MCV: 93.6 fL (ref 78.0–100.0)
Platelets: 204 10*3/uL (ref 150–400)
RBC: 5.18 MIL/uL (ref 4.22–5.81)
RDW: 12.7 % (ref 11.5–15.5)
WBC: 15.2 10*3/uL — ABNORMAL HIGH (ref 4.0–10.5)

## 2011-06-23 LAB — HEPATIC FUNCTION PANEL
ALT: 41 U/L (ref 0–53)
AST: 64 U/L — ABNORMAL HIGH (ref 0–37)
Albumin: 3.6 g/dL (ref 3.5–5.2)
Total Protein: 7.6 g/dL (ref 6.0–8.3)

## 2011-06-23 LAB — BASIC METABOLIC PANEL
BUN: 10 mg/dL (ref 6–23)
CO2: 27 mEq/L (ref 19–32)
Chloride: 103 mEq/L (ref 96–112)
Creatinine, Ser: 0.92 mg/dL (ref 0.50–1.35)
Glucose, Bld: 93 mg/dL (ref 70–99)
Potassium: 4.7 mEq/L (ref 3.5–5.1)

## 2011-06-23 LAB — HEMOGLOBIN A1C: Hgb A1c MFr Bld: 5.3 % (ref <5.7)

## 2011-06-23 MED ORDER — METOPROLOL TARTRATE 25 MG PO TABS
25.0000 mg | ORAL_TABLET | Freq: Three times a day (TID) | ORAL | Status: DC
  Administered 2011-06-23 – 2011-06-24 (×4): 25 mg via ORAL
  Filled 2011-06-23 (×7): qty 1
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
## 2011-06-23 NOTE — Progress Notes (Signed)
CARDIAC REHAB PHASE I   PRE:  Rate/Rhythm: 102 ST    BP: sitting 121/86    SaO2: 96  RA  MODE:  Ambulation: 900 ft   POST:  Rate/Rhythm: 116 ST    BP: sitting 129/79     SaO2:   Doing well, no CP. Some SOB walking and talking, which pt denies, then sts its normal for him. Ed completed. Requests his name be sent to [facility] although is hesitant to do program. Highly encouraged along with smoking cessation. Will send referral. [phone]  Harriet Masson CES, ACSM

## 2011-06-24 DIAGNOSIS — Z888 Allergy status to other drugs, medicaments and biological substances status: Secondary | ICD-10-CM | POA: Diagnosis present

## 2011-06-24 LAB — BASIC METABOLIC PANEL
CO2: 25 mEq/L (ref 19–32)
Calcium: 9.7 mg/dL (ref 8.4–10.5)
Chloride: 101 mEq/L (ref 96–112)
Creatinine, Ser: 0.82 mg/dL (ref 0.50–1.35)
GFR calc Af Amer: >90 mL/min (ref >90)
Sodium: 138 mEq/L (ref 135–145)

## 2011-06-24 LAB — CBC
Platelets: 215 10*3/uL (ref 150–400)
RBC: 5.37 MIL/uL (ref 4.22–5.81)
RDW: 12.8 % (ref 11.5–15.5)
WBC: 14.4 10*3/uL — ABNORMAL HIGH (ref 4.0–10.5)

## 2011-06-24 MED ORDER — ALPRAZOLAM 0.5 MG PO TABS: 0.5000 mg | ORAL_TABLET | Freq: Two times a day (BID) | ORAL | Status: AC | PRN

## 2011-06-24 MED ORDER — LISINOPRIL 5 MG PO TABS: 5.0000 mg | ORAL_TABLET | Freq: Every day | ORAL | Status: DC

## 2011-06-24 MED ORDER — NITROGLYCERIN 0.4 MG SL SUBL: 0.4000 mg | SUBLINGUAL_TABLET | SUBLINGUAL | Status: DC | PRN

## 2011-06-24 MED ORDER — ROSUVASTATIN CALCIUM 40 MG PO TABS: 40.0000 mg | ORAL_TABLET | Freq: Every day | ORAL | Status: DC

## 2011-06-24 MED ORDER — ALPRAZOLAM 0.25 MG PO TABS
0.2500 mg | ORAL_TABLET | Freq: Three times a day (TID) | ORAL | Status: DC
  Administered 2011-06-24: 0.25 mg via ORAL
  Filled 2011-06-24: qty 1

## 2011-06-24 MED ORDER — METOPROLOL TARTRATE 25 MG PO TABS
25.0000 mg | ORAL_TABLET | Freq: Once | ORAL | Status: AC
Start: 2011-06-24 — End: 2011-06-24
  Administered 2011-06-24: 25 mg via ORAL
  Filled 2011-06-24: qty 1

## 2011-06-24 MED ORDER — ALPRAZOLAM 0.5 MG PO TABS
0.5000 mg | ORAL_TABLET | Freq: Three times a day (TID) | ORAL | Status: DC
  Administered 2011-06-24 – 2011-06-25 (×2): 0.5 mg via ORAL
  Filled 2011-06-24 (×2): qty 1

## 2011-06-24 MED ORDER — METOPROLOL TARTRATE 50 MG PO TABS: 50.0000 mg | ORAL_TABLET | Freq: Two times a day (BID) | ORAL | Status: DC

## 2011-06-24 MED ORDER — FAMOTIDINE 20 MG PO TABS: 20.0000 mg | ORAL_TABLET | Freq: Two times a day (BID) | ORAL | Status: DC

## 2011-06-24 MED ORDER — NICOTINE 14 MG/24HR TD PT24: MEDICATED_PATCH | TRANSDERMAL | Status: DC

## 2011-06-24 MED ORDER — PRASUGREL HCL 10 MG PO TABS: 10.0000 mg | ORAL_TABLET | Freq: Every day | ORAL | Status: DC

## 2011-06-24 MED ORDER — METOPROLOL TARTRATE 50 MG PO TABS
50.0000 mg | ORAL_TABLET | Freq: Two times a day (BID) | ORAL | Status: DC
  Administered 2011-06-24 – 2011-06-25 (×2): 50 mg via ORAL
  Filled 2011-06-24 (×3): qty 1

## 2011-06-24 MED ORDER — ALPRAZOLAM 0.25 MG PO TABS
0.2500 mg | ORAL_TABLET | Freq: Once | ORAL | Status: AC
  Administered 2011-06-24: 0.25 mg via ORAL
  Filled 2011-06-24: qty 1

## 2011-06-24 NOTE — Progress Notes (Signed)
CARDIAC REHAB PHASE I   PRE:  Rate/Rhythm: 100 ST  BP:  Supine:   Sitting: 128/64  Standing:    SaO2:   MODE:  Ambulation: 350 ft   POST:  Rate/Rhythem: 115 ST  BP:  Supine:   Sitting: 126/78  Standing:   SaO2:   D[place] Torok Taylor  Pt ambulated 350 ft without assist. Tolerated very well, only complaint was foot pain from walking on hospital floors in socks. No other symptoms. Returned to bedside. VSS. Pt had no questions from prior cardiac discharge education. Will send outpatient cardiac rehab referral. Electronically signed by Harriett Sine MS on June 24 2011 on 1142 EST

## 2011-06-24 NOTE — Progress Notes (Signed)
   CARE MANAGEMENT NOTE 06/24/2011  Patient:  Brian Caldwell, Brian Caldwell   Account Number:  [id number]  Date Initiated:  06/24/2011  Documentation initiated by:  GRAVES-BIGELOW,Nixxon Faria  Subjective/Objective Assessment:   Pt in with nstemi. Plan for home on effient. Pt's medication will be 3.00. CM did contact laura NP and she will call medicaid to make them aware that pt will need medicaiton.     Action/Plan:   No other needs assessed by CM at this time. Plan d/c 06-25-11   Anticipated DC Date:  06/25/2011   Anticipated DC Plan:  HOME/SELF CARE      DC Planning Services  CM consult      Choice offered to / List presented to:             Status of service:  Completed, signed off Medicare Important Message given?   (If response is "NO", the following Medicare IM given date fields will be blank) Date Medicare IM given:   Date Additional Medicare IM given:    Discharge Disposition:  HOME/SELF CARE  Per UR Regulation:    Comments:

## 2011-06-24 NOTE — Discharge Instructions (Signed)
Call [facility] if any bleeding, swelling or drainage at cath site.  May shower, no tub baths for 48 hours for groin sticks.   No driving for 1 week Heart healthy diet No lifting or strenuous activity for 2 weeks  Take 1 NTG, under your tongue, while sitting.  If no relief of pain may repeat NTG, one tab every 5 minutes up to 3 tablets total over 15 minutes.  If no relief CALL 911.  If you have dizziness/lightheadness  while taking NTG, stop taking and call 911.        DO NOT STOP Effient, stopping could cause a heart attack.  Stop smoking.      Acute Coronary Syndrome Acute coronary syndrome (ACS) is an urgent problem in which the blood and oxygen supply to the heart is critically deficient. ACS requires hospitalization because one or more coronary arteries may be blocked. ACS represents a range of conditions including:  Previous angina that is now unstable, lasts longer, happens at rest, or is more intense.   A heart attack, with heart muscle cell injury and death.  There are three vital coronary arteries that supply the heart muscle with blood and oxygen so that it can pump blood effectively. If blockages to these arteries develop, blood flow to the heart muscle is reduced. If the heart does not get enough blood, angina may occur as the first warning sign. SYMPTOMS   The most common signs of angina include:   Tightness or squeezing in the chest.   Feeling of heaviness on the chest.   Discomfort in the arms, neck, or jaw.   Shortness of breath and nausea.   Cold, wet skin.   Angina is usually brought on by physical effort or excitement which increase the oxygen needs of the heart. These states increase the blood flow needs of the heart beyond what can be delivered.  TREATMENT   Medicines to help discomfort may include nitroglycerin (nitro) in the form of tablets or a spray for rapid relief, or longer-acting forms such as cream, patches, or  capsules. (Be aware that there are many side effects and possible interactions with other drugs).   Other medicines may be used to help the heart pump better.   Procedures to open blocked arteries including angioplasty or stent placement to keep the arteries open.   Open heart surgery may be needed when there are many blockages or they are in critical locations that are best treated with surgery.  HOME CARE INSTRUCTIONS   Avoid smoking.   Take one baby or adult aspirin daily, if your caregiver advises. This helps reduce the risk of a heart attack.   It is very important that you follow the angina treatment prescribed by your caregiver. Make arrangements for proper follow-up care.   Eat a heart healthy diet with salt and fat restrictions as advised.   Regular exercise is good for you as long as it does not cause discomfort. Do not begin any new type of exercise until you check with your caregiver.   If you are overweight, you should lose weight.   Try to maintain normal blood lipid levels.   Keep your blood pressure under control as recommended by your caregiver.   You should tell your caregiver right away about any increase in the severity or frequency of your chest discomfort or angina attacks. When you have angina, you should stop what you are doing and sit down. This may bring relief  in 3 to 5 minutes. If your caregiver has prescribed nitro, take it as directed.   If your caregiver has given you a follow-up appointment, it is very important to keep that appointment. Not keeping the appointment could result in a chronic or permanent injury, pain, and disability. If there is any problem keeping the appointment, you must call back to this facility for assistance.  SEEK IMMEDIATE MEDICAL CARE IF:   You develop nausea, vomiting, or shortness of breath.   You feel faint, lightheaded, or pass out.   Your chest discomfort gets worse.   You are sweating or experience sudden profound  fatigue.   You do not get relief of your chest pain after 3 doses of nitro.   Your discomfort lasts longer than 15 minutes.  MAKE SURE YOU:   Understand these instructions.   Will watch your condition.   Will get help right away if you are not doing well or get worse.  Document Released: 04/18/2005 Document Revised: 12/29/2010 Document Reviewed: 11/20/2007 [facility] Patient Information 2012 [place], [place].
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
## 2011-06-24 NOTE — Progress Notes (Signed)
Pt. Has been given preauthorization from Medicaid for the Effient, beginning 06/24/11.

## 2011-06-24 NOTE — Consult Note (Signed)
Pt was a 3 ppd smoker and almost quit on chantix before but he only took the chantix for 2 weeks and got down to 2 cigarettes/day. He is smoking 1/2 ppd now and is eager to quit again. Pt in action stage. He wants to try chantix again to quit completely. Referred pt to MD for RX and reviewed chantix use instructions, side effects etc. Referred to 1-800 quit now for f/u and support. Discussed oral fixation substitutes, second hand smoke and in home smoking policy. Reviewed and gave pt Written education/contact information.

## 2011-06-25 LAB — BASIC METABOLIC PANEL
BUN: 13 mg/dL (ref 6–23)
CO2: 24 mEq/L (ref 19–32)
Calcium: 9.9 mg/dL (ref 8.4–10.5)
Creatinine, Ser: 0.71 mg/dL (ref 0.50–1.35)

## 2011-06-25 LAB — CBC
MCH: 32.3 pg (ref 26.0–34.0)
MCV: 92.8 fL (ref 78.0–100.0)
Platelets: 209 10*3/uL (ref 150–400)
RBC: 5.29 MIL/uL (ref 4.22–5.81)
RDW: 12.6 % (ref 11.5–15.5)

## 2011-06-25 MED ORDER — METOPROLOL TARTRATE 50 MG PO TABS
75.0000 mg | ORAL_TABLET | Freq: Two times a day (BID) | ORAL | Status: DC
  Filled 2011-06-25: qty 1

## 2011-06-25 MED ORDER — METOPROLOL TARTRATE 50 MG PO TABS: 75.0000 mg | ORAL_TABLET | Freq: Two times a day (BID) | ORAL | Status: DC
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
## 2011-06-25 NOTE — Progress Notes (Signed)
Pt. Discharged 06/25/2011  12:40 PM Discharge instructions reviewed with patient/family. Patient/family verbalized understanding. All Rx's given. Questions answered as needed. Pt. Discharged to home with family/self. Taken off unit via W/C. Brian Caldwell
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
## 2011-08-17 DIAGNOSIS — J329 Chronic sinusitis, unspecified: Secondary | ICD-10-CM | POA: Insufficient documentation

## 2011-08-17 DIAGNOSIS — Z794 Long term (current) use of insulin: Secondary | ICD-10-CM | POA: Insufficient documentation

## 2011-08-17 DIAGNOSIS — IMO0002 Reserved for concepts with insufficient information to code with codable children: Secondary | ICD-10-CM | POA: Insufficient documentation

## 2011-08-17 DIAGNOSIS — M129 Arthropathy, unspecified: Secondary | ICD-10-CM | POA: Insufficient documentation

## 2011-08-17 DIAGNOSIS — I252 Old myocardial infarction: Secondary | ICD-10-CM | POA: Insufficient documentation

## 2011-08-17 DIAGNOSIS — Z87891 Personal history of nicotine dependence: Secondary | ICD-10-CM | POA: Insufficient documentation

## 2011-08-17 DIAGNOSIS — I10 Essential (primary) hypertension: Secondary | ICD-10-CM | POA: Insufficient documentation

## 2011-08-18 ENCOUNTER — Emergency Department (HOSPITAL_COMMUNITY)
Admission: EM | Admit: 2011-08-18 | Discharge: 2011-08-18 | Disposition: A | Discharge status: Home / Self Care | Payer: Medicaid Other | Attending: Emergency Medicine | Admitting: Emergency Medicine

## 2011-08-18 ENCOUNTER — Encounter (HOSPITAL_COMMUNITY): Payer: Self-pay | Admitting: *Deleted

## 2011-08-18 DIAGNOSIS — L02411 Cutaneous abscess of right axilla: Secondary | ICD-10-CM

## 2011-08-18 DIAGNOSIS — J329 Chronic sinusitis, unspecified: Secondary | ICD-10-CM

## 2011-08-18 MED ORDER — SULFAMETHOXAZOLE-TMP DS 800-160 MG PO TABS
1.0000 | ORAL_TABLET | Freq: Once | ORAL | Status: AC
  Administered 2011-08-18: 1 via ORAL
  Filled 2011-08-18: qty 1

## 2011-08-18 MED ORDER — TRAMADOL HCL 50 MG PO TABS: 50.0000 mg | ORAL_TABLET | Freq: Four times a day (QID) | ORAL | Status: AC | PRN

## 2011-08-18 MED ORDER — SULFAMETHOXAZOLE-TRIMETHOPRIM 800-160 MG PO TABS: 1.0000 | ORAL_TABLET | Freq: Two times a day (BID) | ORAL | Status: AC
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
## 2011-08-18 NOTE — ED Notes (Addendum)
Pt reports he did have a cyst under right arm apx 2 days ago, that ruptured on it's own.  States that he does still experience pain however.  Pt also reporting sinus pain and pressure, as well as ear ache. Pt reports sinus pain is primary concern.

## 2011-08-18 NOTE — Discharge Instructions (Signed)
Return to the hospital for severe or worsening swelling in your armpit or pain in your face. You may also return for high fevers or vomiting.  Please see the attached followup list for local family doctors.

## 2011-10-23 ENCOUNTER — Emergency Department (HOSPITAL_COMMUNITY)
Admission: EM | Admit: 2011-10-23 | Discharge: 2011-10-23 | Disposition: A | Discharge status: Home / Self Care | Payer: Medicaid Other | Attending: Emergency Medicine | Admitting: Emergency Medicine

## 2011-10-23 ENCOUNTER — Encounter (HOSPITAL_COMMUNITY): Payer: Self-pay | Admitting: Emergency Medicine

## 2011-10-23 DIAGNOSIS — IMO0002 Reserved for concepts with insufficient information to code with codable children: Secondary | ICD-10-CM | POA: Insufficient documentation

## 2011-10-23 DIAGNOSIS — L02411 Cutaneous abscess of right axilla: Secondary | ICD-10-CM

## 2011-10-23 DIAGNOSIS — I252 Old myocardial infarction: Secondary | ICD-10-CM | POA: Insufficient documentation

## 2011-10-23 DIAGNOSIS — Z8249 Family history of ischemic heart disease and other diseases of the circulatory system: Secondary | ICD-10-CM | POA: Insufficient documentation

## 2011-10-23 DIAGNOSIS — I10 Essential (primary) hypertension: Secondary | ICD-10-CM | POA: Insufficient documentation

## 2011-10-23 DIAGNOSIS — Z87891 Personal history of nicotine dependence: Secondary | ICD-10-CM | POA: Insufficient documentation

## 2011-10-23 DIAGNOSIS — Z809 Family history of malignant neoplasm, unspecified: Secondary | ICD-10-CM | POA: Insufficient documentation

## 2011-10-23 DIAGNOSIS — M129 Arthropathy, unspecified: Secondary | ICD-10-CM | POA: Insufficient documentation

## 2011-10-23 MED ORDER — HYDROCODONE-ACETAMINOPHEN 5-325 MG PO TABS
2.0000 | ORAL_TABLET | Freq: Once | ORAL | Status: AC
  Administered 2011-10-23: 2 via ORAL
  Filled 2011-10-23: qty 2

## 2011-10-23 MED ORDER — SULFAMETHOXAZOLE-TMP DS 800-160 MG PO TABS
1.0000 | ORAL_TABLET | Freq: Once | ORAL | Status: AC
  Administered 2011-10-23: 1 via ORAL
  Filled 2011-10-23: qty 1

## 2011-10-23 MED ORDER — LEVOFLOXACIN 500 MG PO TABS: 500.0000 mg | ORAL_TABLET | Freq: Every day | ORAL | Status: AC

## 2011-10-23 MED ORDER — ONDANSETRON HCL 4 MG PO TABS
4.0000 mg | ORAL_TABLET | Freq: Once | ORAL | Status: AC
  Administered 2011-10-23: 4 mg via ORAL
  Filled 2011-10-23: qty 1

## 2011-10-23 MED ORDER — LEVOFLOXACIN 500 MG PO TABS
500.0000 mg | ORAL_TABLET | Freq: Once | ORAL | Status: AC
  Administered 2011-10-23: 500 mg via ORAL
  Filled 2011-10-23: qty 1

## 2011-10-23 MED ORDER — SULFAMETHOXAZOLE-TRIMETHOPRIM 800-160 MG PO TABS: 1.0000 | ORAL_TABLET | Freq: Two times a day (BID) | ORAL | Status: AC

## 2011-10-23 MED ORDER — HYDROCODONE-ACETAMINOPHEN 7.5-325 MG PO TABS: 1.0000 | ORAL_TABLET | ORAL | Status: AC | PRN

## 2011-10-23 NOTE — ED Notes (Signed)
abscess to right axilla and between right 4th and 5th toe

## 2011-10-23 NOTE — Discharge Instructions (Signed)
Please call Dr. Lovell Sheehan for surgical appointment concerning the abscess of your right axilla. Levaquin 500 mg daily with food, Septra one every 12 hours with food. Norco one every 4 hours if needed for pain. Please soak the abscess area in a tub of warm water daily until seen by the surgeon.Abscess An abscess (boil or furuncle) is an infected area under your skin. This area is filled with yellowish white fluid (pus). HOME CARE   Only take medicine as told by your doctor.   Keep the skin clean around your abscess. Keep clothes that may touch the abscess clean.   Change any bandages (dressings) as told by your doctor.   Avoid direct skin contact with other people. The infection can spread by skin contact with others.   Practice good hygiene and do not share personal care items.   Do not share athletic equipment, towels, or whirlpools. Shower after every practice or work out session.   If a draining area cannot be covered:   Do not play sports.   Children should not go to daycare until the wound has healed or until fluid (drainage) stops coming out of the wound.   See your doctor for a follow-up visit as told.  GET HELP RIGHT AWAY IF:   There is more pain, puffiness (swelling), and redness in the wound site.   There is fluid or bleeding from the wound site.   You have muscle aches, chills, fever, or feel sick.   You or your child has a temperature by mouth above 102 F (38.9 C), not controlled by medicine.   Your baby is older than 3 months with a rectal temperature of 102 F (38.9 C) or higher.  MAKE SURE YOU:   Understand these instructions.   Will watch your condition.   Will get help right away if you are not doing well or get worse.  Document Released: 10/05/2007 Document Revised: 04/07/2011 Document Reviewed: 10/05/2007 [facility] Patient Information 2012 [place], [place].
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
## 2011-10-26 LAB — CULTURE, ROUTINE-ABSCESS

## 2011-10-27 NOTE — ED Notes (Signed)
+   Wound Patient treated with Bactrim-sensitive to same-chart appended per protocol MD. 

## 2011-10-27 NOTE — ED Provider Notes (Signed)
Medical screening examination/treatment/procedure(s) were performed by non-physician practitioner and as supervising physician I was immediately available for consultation/collaboration.  Hurman Horn, MD 10/27/11 [phone]

## 2012-12-21 ENCOUNTER — Encounter: Payer: Self-pay | Admitting: Cardiovascular Disease

## 2012-12-24 ENCOUNTER — Ambulatory Visit: Payer: Medicaid Other | Admitting: Cardiovascular Disease

## 2013-01-04 ENCOUNTER — Ambulatory Visit: Payer: Medicaid Other | Admitting: Cardiovascular Disease

## 2013-01-17 IMAGING — CR DG CHEST 1V PORT
1 series · 1 of 1 positions shown · non-contrast
Comparison: None.

CLINICAL DATA: Medial chest pain, worsened tonight; history of
smoking.

PORTABLE CHEST - 1 VIEW

[view not recorded]
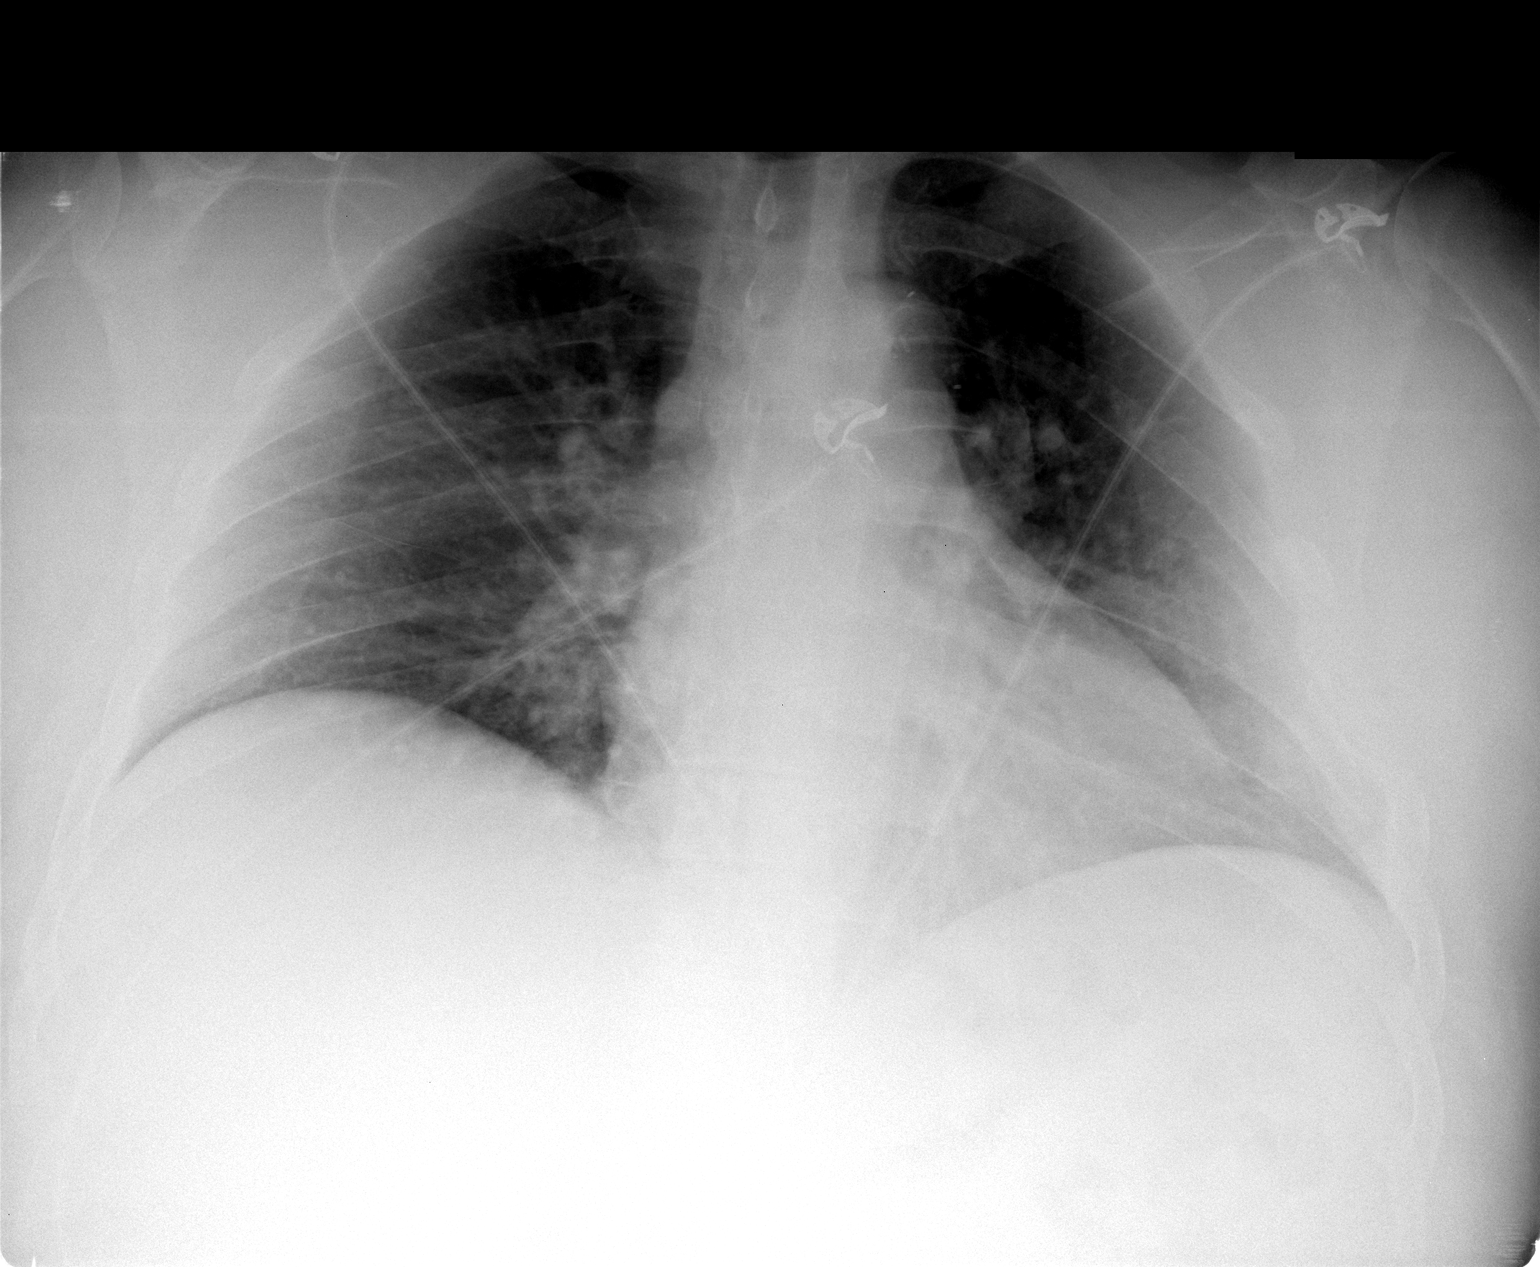

[1 of 1 positions shown; findings below may reference images not displayed]

FINDINGS: The lungs are well-aerated.  Vascular congestion is
noted, without significant pulmonary edema.  There is no evidence
of focal opacification, pleural effusion or pneumothorax.

The cardiomediastinal silhouette is within normal limits.  No acute
osseous abnormalities are seen.  Displaced rib fractures involving
the left third, fourth and fifth lateral ribs, particularly
displaced at the left fifth rib, appear to be chronic in nature.
IMPRESSION: 1.  Vascular congestion, without significant pulmonary edema.
Lungs remain grossly clear.
2.  Chronic displaced rib fractures noted, involving the left
third, fourth and fifth lateral ribs.

## 2013-01-23 ENCOUNTER — Ambulatory Visit: Payer: Medicaid Other | Admitting: Cardiovascular Disease

## 2013-01-25 ENCOUNTER — Encounter: Payer: Self-pay | Admitting: Cardiovascular Disease

## 2013-01-25 ENCOUNTER — Ambulatory Visit (INDEPENDENT_AMBULATORY_CARE_PROVIDER_SITE_OTHER): Payer: Medicaid Other | Admitting: Cardiovascular Disease

## 2013-01-25 VITALS — BP 146/80 | HR 88 | Ht 72.0 in | Wt 262.5 lb

## 2013-01-25 DIAGNOSIS — I2510 Atherosclerotic heart disease of native coronary artery without angina pectoris: Secondary | ICD-10-CM

## 2013-01-25 DIAGNOSIS — E785 Hyperlipidemia, unspecified: Secondary | ICD-10-CM | POA: Insufficient documentation

## 2013-01-25 DIAGNOSIS — Z720 Tobacco use: Secondary | ICD-10-CM

## 2013-01-25 DIAGNOSIS — Z79899 Other long term (current) drug therapy: Secondary | ICD-10-CM

## 2013-01-25 DIAGNOSIS — F17200 Nicotine dependence, unspecified, uncomplicated: Secondary | ICD-10-CM

## 2013-01-25 MED ORDER — METOPROLOL TARTRATE 50 MG PO TABS: 50.0000 mg | ORAL_TABLET | Freq: Two times a day (BID) | ORAL | Status: DC

## 2013-01-25 MED ORDER — VARENICLINE TARTRATE 0.5 MG PO TABS: 0.5000 mg | ORAL_TABLET | Freq: Two times a day (BID) | ORAL | Status: DC

## 2013-01-25 MED ORDER — LISINOPRIL 5 MG PO TABS: 5.0000 mg | ORAL_TABLET | Freq: Every day | ORAL | Status: DC

## 2013-01-25 MED ORDER — ATORVASTATIN CALCIUM 80 MG PO TABS: 40.0000 mg | ORAL_TABLET | Freq: Every day | ORAL | Status: DC

## 2013-01-25 NOTE — Assessment & Plan Note (Signed)
The patient expresses a desire to stop. I have provided him with a prescription for Chantix

## 2013-01-25 NOTE — Assessment & Plan Note (Signed)
He stopped his atorvastatin 6 or 7 months ago. On the rewrite a prescription and recheck a fasting lipid liver profile in approximately 2 months.

## 2013-01-25 NOTE — Assessment & Plan Note (Signed)
Patient presented with a STEMI 06/21/11. Cardiac catheterization revealed an occluded proximal LAD Valium diagonal branch which was stented with a bare-metal stent. No other significant CAD. His EF is 45% with anteroapical hypokinesia. Followup 2-D echo performed 09/07/11 revealed essentially normal LV function. He has had no recurrent chest pain. He did stop his dual antiplatelet therapy several months ago.
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
## 2013-01-25 NOTE — Patient Instructions (Addendum)
Restart the atorvastatin, lisinopril, and metoprolol.  I have sent in the prescription.  Have blood work done fasting.  We have sent in a prescription for chantix to help you stop smoking.    Dr Allyson Sabal will see you back in 1 year

## 2013-02-05 ENCOUNTER — Other Ambulatory Visit: Payer: Self-pay

## 2013-02-05 MED ORDER — NITROGLYCERIN 0.4 MG SL SUBL: 0.4000 mg | SUBLINGUAL_TABLET | SUBLINGUAL | Status: DC | PRN

## 2013-02-05 NOTE — Telephone Encounter (Signed)
Rx was sent to pharmacy electronically. 

## 2014-04-10 ENCOUNTER — Encounter (HOSPITAL_COMMUNITY): Payer: Self-pay | Admitting: Cardiovascular Disease

## 2014-11-03 ENCOUNTER — Encounter (HOSPITAL_COMMUNITY): Payer: Self-pay | Admitting: Emergency Medicine

## 2014-11-03 ENCOUNTER — Emergency Department (HOSPITAL_COMMUNITY): Payer: Medicaid Other

## 2014-11-03 ENCOUNTER — Inpatient Hospital Stay (HOSPITAL_COMMUNITY)
Admission: EM | Admit: 2014-11-03 | Discharge: 2014-11-05 | DRG: 247 | Disposition: A | Discharge status: Home / Self Care | Payer: Medicaid Other | Attending: Cardiology | Admitting: Cardiology

## 2014-11-03 DIAGNOSIS — E876 Hypokalemia: Secondary | ICD-10-CM | POA: Diagnosis not present

## 2014-11-03 DIAGNOSIS — Z79899 Other long term (current) drug therapy: Secondary | ICD-10-CM | POA: Diagnosis not present

## 2014-11-03 DIAGNOSIS — Z880 Allergy status to penicillin: Secondary | ICD-10-CM

## 2014-11-03 DIAGNOSIS — E785 Hyperlipidemia, unspecified: Secondary | ICD-10-CM | POA: Diagnosis present

## 2014-11-03 DIAGNOSIS — I252 Old myocardial infarction: Secondary | ICD-10-CM

## 2014-11-03 DIAGNOSIS — F17210 Nicotine dependence, cigarettes, uncomplicated: Secondary | ICD-10-CM | POA: Diagnosis not present

## 2014-11-03 DIAGNOSIS — Z9861 Coronary angioplasty status: Secondary | ICD-10-CM

## 2014-11-03 DIAGNOSIS — Z886 Allergy status to analgesic agent status: Secondary | ICD-10-CM

## 2014-11-03 DIAGNOSIS — D72829 Elevated white blood cell count, unspecified: Secondary | ICD-10-CM | POA: Diagnosis not present

## 2014-11-03 DIAGNOSIS — I214 Non-ST elevation (NSTEMI) myocardial infarction: Secondary | ICD-10-CM | POA: Diagnosis present

## 2014-11-03 DIAGNOSIS — Z955 Presence of coronary angioplasty implant and graft: Secondary | ICD-10-CM | POA: Diagnosis not present

## 2014-11-03 DIAGNOSIS — I249 Acute ischemic heart disease, unspecified: Secondary | ICD-10-CM | POA: Diagnosis present

## 2014-11-03 DIAGNOSIS — Z91048 Other nonmedicinal substance allergy status: Secondary | ICD-10-CM | POA: Diagnosis not present

## 2014-11-03 DIAGNOSIS — R079 Chest pain, unspecified: Secondary | ICD-10-CM

## 2014-11-03 DIAGNOSIS — Z9114 Patient's other noncompliance with medication regimen: Secondary | ICD-10-CM | POA: Diagnosis not present

## 2014-11-03 DIAGNOSIS — I10 Essential (primary) hypertension: Secondary | ICD-10-CM | POA: Diagnosis present

## 2014-11-03 DIAGNOSIS — I2510 Atherosclerotic heart disease of native coronary artery without angina pectoris: Secondary | ICD-10-CM

## 2014-11-03 DIAGNOSIS — Z87820 Personal history of traumatic brain injury: Secondary | ICD-10-CM

## 2014-11-03 DIAGNOSIS — I255 Ischemic cardiomyopathy: Secondary | ICD-10-CM | POA: Diagnosis present

## 2014-11-03 DIAGNOSIS — Z720 Tobacco use: Secondary | ICD-10-CM | POA: Diagnosis present

## 2014-11-03 DIAGNOSIS — I2582 Chronic total occlusion of coronary artery: Secondary | ICD-10-CM | POA: Diagnosis present

## 2014-11-03 HISTORY — DX: Allergy status to other drugs, medicaments and biological substances: Z88.8

## 2014-11-03 HISTORY — DX: Essential (primary) hypertension: I10

## 2014-11-03 HISTORY — DX: Coronary angioplasty status: Z98.61

## 2014-11-03 HISTORY — DX: Atherosclerotic heart disease of native coronary artery without angina pectoris: I25.10

## 2014-11-03 HISTORY — DX: Hyperlipidemia, unspecified: E78.5

## 2014-11-03 HISTORY — DX: Ischemic cardiomyopathy: I25.5

## 2014-11-03 LAB — BASIC METABOLIC PANEL
ANION GAP: 10 (ref 5–15)
BUN: 9 mg/dL (ref 6–20)
CHLORIDE: 100 mmol/L — AB (ref 101–111)
CO2: 28 mmol/L (ref 22–32)
Calcium: 9.5 mg/dL (ref 8.9–10.3)
Creatinine, Ser: 0.85 mg/dL (ref 0.61–1.24)
GFR calc Af Amer: >60 mL/min (ref >60)
Glucose, Bld: 108 mg/dL — ABNORMAL HIGH (ref 65–99)
Potassium: 3.4 mmol/L — ABNORMAL LOW (ref 3.5–5.1)
SODIUM: 138 mmol/L (ref 135–145)

## 2014-11-03 LAB — CBC
HEMATOCRIT: 53.2 % — AB (ref 39.0–52.0)
Hemoglobin: 18.4 g/dL — ABNORMAL HIGH (ref 13.0–17.0)
MCH: 33 pg (ref 26.0–34.0)
MCHC: 34.6 g/dL (ref 30.0–36.0)
MCV: 95.3 fL (ref 78.0–100.0)
Platelets: 190 10*3/uL (ref 150–400)
RBC: 5.58 MIL/uL (ref 4.22–5.81)
RDW: 12.7 % (ref 11.5–15.5)
WBC: 16.6 10*3/uL — ABNORMAL HIGH (ref 4.0–10.5)

## 2014-11-03 LAB — TROPONIN I
TROPONIN I: 2.63 ng/mL — AB (ref <0.031)
Troponin I: 0.08 ng/mL — ABNORMAL HIGH (ref <0.031)
Troponin I: 3.65 ng/mL (ref <0.031)

## 2014-11-03 LAB — MAGNESIUM: Magnesium: 1.8 mg/dL (ref 1.7–2.4)

## 2014-11-03 LAB — PROTIME-INR
INR: 1.02 (ref 0.00–1.49)
Prothrombin Time: 13.6 seconds (ref 11.6–15.2)

## 2014-11-03 LAB — T4, FREE: Free T4: 0.99 ng/dL (ref 0.61–1.12)

## 2014-11-03 LAB — HEPARIN LEVEL (UNFRACTIONATED): Heparin Unfractionated: <0.1 IU/mL — ABNORMAL LOW (ref 0.30–0.70)

## 2014-11-03 LAB — TSH: TSH: 1.167 u[IU]/mL (ref 0.350–4.500)

## 2014-11-03 LAB — MRSA PCR SCREENING: MRSA by PCR: NEGATIVE

## 2014-11-03 MED ORDER — HYDROMORPHONE HCL 1 MG/ML IJ SOLN
0.5000 mg | INTRAMUSCULAR | Status: DC | PRN
  Administered 2014-11-03: 0.5 mg via INTRAVENOUS
  Filled 2014-11-03 (×2): qty 1

## 2014-11-03 MED ORDER — NITROGLYCERIN IN D5W 200-5 MCG/ML-% IV SOLN
5.0000 ug/min | INTRAVENOUS | Status: DC
  Administered 2014-11-03: 5 ug/min via INTRAVENOUS
  Filled 2014-11-03: qty 250

## 2014-11-03 MED ORDER — MORPHINE SULFATE 4 MG/ML IJ SOLN
4.0000 mg | Freq: Once | INTRAMUSCULAR | Status: AC
  Administered 2014-11-03: 4 mg via INTRAVENOUS
  Filled 2014-11-03: qty 1

## 2014-11-03 MED ORDER — SODIUM CHLORIDE 0.9 % IJ SOLN
3.0000 mL | Freq: Two times a day (BID) | INTRAMUSCULAR | Status: DC
  Administered 2014-11-04: 3 mL via INTRAVENOUS

## 2014-11-03 MED ORDER — TIROFIBAN HCL IV 5 MG/100ML
0.1500 ug/kg/min | INTRAVENOUS | Status: DC
  Administered 2014-11-03 – 2014-11-04 (×4): 0.15 ug/kg/min via INTRAVENOUS
  Filled 2014-11-03 (×8): qty 100

## 2014-11-03 MED ORDER — ONDANSETRON HCL 4 MG/2ML IJ SOLN
4.0000 mg | Freq: Once | INTRAMUSCULAR | Status: AC
  Administered 2014-11-03: 4 mg via INTRAVENOUS
  Filled 2014-11-03: qty 2

## 2014-11-03 MED ORDER — HEPARIN (PORCINE) IN NACL 100-0.45 UNIT/ML-% IJ SOLN
2100.0000 [IU]/h | INTRAMUSCULAR | Status: DC
  Administered 2014-11-03 (×2): 1450 [IU]/h via INTRAVENOUS
  Administered 2014-11-04: 1800 [IU]/h via INTRAVENOUS
  Filled 2014-11-03 (×4): qty 250

## 2014-11-03 MED ORDER — NITROGLYCERIN 0.4 MG SL SUBL: 0.4000 mg | SUBLINGUAL_TABLET | SUBLINGUAL | Status: DC | PRN

## 2014-11-03 MED ORDER — METOPROLOL TARTRATE 12.5 MG HALF TABLET
12.5000 mg | ORAL_TABLET | Freq: Two times a day (BID) | ORAL | Status: DC
  Filled 2014-11-03: qty 1

## 2014-11-03 MED ORDER — NITROGLYCERIN 0.4 MG SL SUBL
0.4000 mg | SUBLINGUAL_TABLET | SUBLINGUAL | Status: DC | PRN
  Administered 2014-11-03 (×3): 0.4 mg via SUBLINGUAL
  Filled 2014-11-03: qty 1

## 2014-11-03 MED ORDER — TIROFIBAN HCL IV 5 MG/100ML: 0.1500 ug/kg/min | INTRAVENOUS | Status: DC

## 2014-11-03 MED ORDER — SODIUM CHLORIDE 0.9 % IV SOLN
INTRAVENOUS | Status: DC
  Administered 2014-11-03: 16:00:00 via INTRAVENOUS

## 2014-11-03 MED ORDER — SODIUM CHLORIDE 0.9 % IV SOLN: 250.0000 mL | INTRAVENOUS | Status: DC | PRN

## 2014-11-03 MED ORDER — HEPARIN BOLUS VIA INFUSION
4000.0000 [IU] | Freq: Once | INTRAVENOUS | Status: AC
  Administered 2014-11-03: 4000 [IU] via INTRAVENOUS
  Filled 2014-11-03: qty 4000

## 2014-11-03 MED ORDER — SODIUM CHLORIDE 0.9 % IJ SOLN: 3.0000 mL | INTRAMUSCULAR | Status: DC | PRN

## 2014-11-03 MED ORDER — TIROFIBAN (AGGRASTAT) BOLUS VIA INFUSION
25.0000 ug/kg | Freq: Once | INTRAVENOUS | Status: AC
  Administered 2014-11-03: 3132.5 ug via INTRAVENOUS
  Filled 2014-11-03: qty 63

## 2014-11-03 MED ORDER — PRASUGREL HCL 10 MG PO TABS
60.0000 mg | ORAL_TABLET | Freq: Once | ORAL | Status: AC
  Administered 2014-11-03: 60 mg via ORAL

## 2014-11-03 MED ORDER — METOPROLOL TARTRATE 12.5 MG HALF TABLET
12.5000 mg | ORAL_TABLET | ORAL | Status: AC
  Administered 2014-11-03: 12.5 mg via ORAL
  Filled 2014-11-03: qty 1

## 2014-11-03 MED ORDER — POTASSIUM CHLORIDE CRYS ER 20 MEQ PO TBCR
40.0000 meq | EXTENDED_RELEASE_TABLET | Freq: Once | ORAL | Status: AC
  Administered 2014-11-03: 40 meq via ORAL
  Filled 2014-11-03: qty 2

## 2014-11-03 MED ORDER — ACETAMINOPHEN 325 MG PO TABS: 650.0000 mg | ORAL_TABLET | ORAL | Status: DC | PRN

## 2014-11-03 MED ORDER — ALPRAZOLAM 0.25 MG PO TABS
0.2500 mg | ORAL_TABLET | Freq: Two times a day (BID) | ORAL | Status: DC | PRN
  Administered 2014-11-05: 0.25 mg via ORAL
  Filled 2014-11-03: qty 1

## 2014-11-03 MED ORDER — SODIUM CHLORIDE 0.9 % WEIGHT BASED INFUSION
1.0000 mL/kg/h | INTRAVENOUS | Status: DC
  Administered 2014-11-04: 1 mL/kg/h via INTRAVENOUS

## 2014-11-03 MED ORDER — ONDANSETRON HCL 4 MG/2ML IJ SOLN: 4.0000 mg | Freq: Four times a day (QID) | INTRAMUSCULAR | Status: DC | PRN

## 2014-11-03 MED ORDER — PRASUGREL HCL 10 MG PO TABS
10.0000 mg | ORAL_TABLET | Freq: Every day | ORAL | Status: DC
  Administered 2014-11-04 – 2014-11-05 (×2): 10 mg via ORAL
  Filled 2014-11-03 (×3): qty 1

## 2014-11-03 MED ORDER — METOPROLOL TARTRATE 25 MG PO TABS
25.0000 mg | ORAL_TABLET | Freq: Two times a day (BID) | ORAL | Status: DC
  Administered 2014-11-03 – 2014-11-04 (×3): 25 mg via ORAL
  Filled 2014-11-03 (×5): qty 1

## 2014-11-03 MED ORDER — ATORVASTATIN CALCIUM 40 MG PO TABS
40.0000 mg | ORAL_TABLET | Freq: Every day | ORAL | Status: DC
  Administered 2014-11-03 – 2014-11-04 (×2): 40 mg via ORAL
  Filled 2014-11-03 (×3): qty 1

## 2014-11-03 MED ORDER — ZOLPIDEM TARTRATE 5 MG PO TABS: 5.0000 mg | ORAL_TABLET | Freq: Every evening | ORAL | Status: DC | PRN

## 2014-11-03 MED ORDER — NITROGLYCERIN IN D5W 200-5 MCG/ML-% IV SOLN
3.0000 ug/min | INTRAVENOUS | Status: DC
  Administered 2014-11-03: 10 ug/min via INTRAVENOUS
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
## 2014-11-03 NOTE — Progress Notes (Signed)
On Call Cardiology:   Patient admitted earlier today with NSTEMI. Prior history of ant STEMI in 2013 with BMS to LAD. Not taking any meds for over a year. Now on NTG gtt at 10 mcg (limited by headache), Heparin gtt, received Effient load 60 mg, Lopressor 12.5 mg po bid. TnI went up from 0.8 to over 2. Still chest tightness and pressure. Vitals stable. No arrhythmia. Lungs clear. NSR. Will discuss with interventional cardiology regarding cath today instead of am tomorrow.   Nevin Bloodgood, MD

## 2014-11-03 NOTE — Progress Notes (Signed)
Utilization Review Completed.Torrell Krutz T7/08/2014  

## 2014-11-03 NOTE — ED Notes (Signed)
Patient with chest pain that started 3 days ago. Denies aggravating factors, but states laying down makes it worse. Patient denies shortness of breath. Reports stent placement in 2013. Dr Gery Pray in [facility] is cardiologist according to pt.

## 2014-11-03 NOTE — ED Notes (Signed)
Patient states he has not taken any of his prescribed medications for 1 year

## 2014-11-03 NOTE — Progress Notes (Signed)
CRITICAL VALUE ALERT  Critical value received: Troponin 2.63   Date of notification:  11/03/14   Time of notification:  2000  Critical value read back:Yes.    Nurse who received alert:  Deniece Ree   MD notified (1st page):  Azeem  Time of first page:  2005  Responding MD:  Tarri Glenn  Time MD responded:  2010
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
## 2014-11-03 NOTE — ED Notes (Signed)
NP at bedside.
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
## 2014-11-03 NOTE — Progress Notes (Signed)
Wife at bedside brought outside food for patient. Patient and wife educated on cardiac diet and not to bring food from outside. Patient and wife verbalized understanding

## 2014-11-04 ENCOUNTER — Encounter (HOSPITAL_COMMUNITY)
Admission: EM | Disposition: A | Discharge status: Home / Self Care | Payer: Medicaid Other | Source: Home / Self Care | Attending: Cardiology

## 2014-11-04 ENCOUNTER — Ambulatory Visit (HOSPITAL_COMMUNITY): Payer: Medicaid Other

## 2014-11-04 ENCOUNTER — Encounter (HOSPITAL_COMMUNITY): Payer: Self-pay | Admitting: Cardiology

## 2014-11-04 DIAGNOSIS — R079 Chest pain, unspecified: Secondary | ICD-10-CM

## 2014-11-04 DIAGNOSIS — Z9861 Coronary angioplasty status: Secondary | ICD-10-CM

## 2014-11-04 DIAGNOSIS — I2510 Atherosclerotic heart disease of native coronary artery without angina pectoris: Secondary | ICD-10-CM

## 2014-11-04 DIAGNOSIS — I255 Ischemic cardiomyopathy: Secondary | ICD-10-CM | POA: Diagnosis present

## 2014-11-04 DIAGNOSIS — Z955 Presence of coronary angioplasty implant and graft: Secondary | ICD-10-CM | POA: Insufficient documentation

## 2014-11-04 DIAGNOSIS — I249 Acute ischemic heart disease, unspecified: Secondary | ICD-10-CM | POA: Diagnosis present

## 2014-11-04 DIAGNOSIS — E785 Hyperlipidemia, unspecified: Secondary | ICD-10-CM

## 2014-11-04 DIAGNOSIS — I2582 Chronic total occlusion of coronary artery: Secondary | ICD-10-CM | POA: Insufficient documentation

## 2014-11-04 HISTORY — PX: CARDIAC CATHETERIZATION: SHX172

## 2014-11-04 LAB — BASIC METABOLIC PANEL
Anion gap: 8 (ref 5–15)
BUN: 5 mg/dL — AB (ref 6–20)
CALCIUM: 9 mg/dL (ref 8.9–10.3)
CO2: 26 mmol/L (ref 22–32)
CREATININE: 0.72 mg/dL (ref 0.61–1.24)
Chloride: 106 mmol/L (ref 101–111)
GFR calc Af Amer: >60 mL/min (ref >60)
GFR calc non Af Amer: >60 mL/min (ref >60)
GLUCOSE: 99 mg/dL (ref 65–99)
Potassium: 4 mmol/L (ref 3.5–5.1)
Sodium: 140 mmol/L (ref 135–145)

## 2014-11-04 LAB — URINALYSIS, ROUTINE W REFLEX MICROSCOPIC
Bilirubin Urine: NEGATIVE
Glucose, UA: NEGATIVE mg/dL
HGB URINE DIPSTICK: NEGATIVE
Ketones, ur: NEGATIVE mg/dL
LEUKOCYTES UA: NEGATIVE
NITRITE: NEGATIVE
PROTEIN: NEGATIVE mg/dL
SPECIFIC GRAVITY, URINE: 1.013 (ref 1.005–1.030)
UROBILINOGEN UA: 0.2 mg/dL (ref 0.0–1.0)
pH: 8 (ref 5.0–8.0)

## 2014-11-04 LAB — HEMOGLOBIN A1C
Hgb A1c MFr Bld: 5.3 % (ref 4.8–5.6)
Mean Plasma Glucose: 105 mg/dL

## 2014-11-04 LAB — CBC
HEMATOCRIT: 48.6 % (ref 39.0–52.0)
HEMOGLOBIN: 16.6 g/dL (ref 13.0–17.0)
MCH: 32.6 pg (ref 26.0–34.0)
MCHC: 34.2 g/dL (ref 30.0–36.0)
MCV: 95.5 fL (ref 78.0–100.0)
Platelets: 189 10*3/uL (ref 150–400)
RBC: 5.09 MIL/uL (ref 4.22–5.81)
RDW: 13 % (ref 11.5–15.5)
WBC: 12.9 10*3/uL — AB (ref 4.0–10.5)

## 2014-11-04 LAB — LIPID PANEL
CHOL/HDL RATIO: 4.3 ratio
Cholesterol: 169 mg/dL (ref 0–200)
HDL: 39 mg/dL — ABNORMAL LOW (ref >40)
LDL CALC: 70 mg/dL (ref 0–99)
TRIGLYCERIDES: 300 mg/dL — AB (ref <150)
VLDL: 60 mg/dL — AB (ref 0–40)

## 2014-11-04 LAB — TROPONIN I: Troponin I: 3.5 ng/mL (ref <0.031)

## 2014-11-04 LAB — POCT ACTIVATED CLOTTING TIME
Activated Clotting Time: 294 seconds
Activated Clotting Time: 313 seconds

## 2014-11-04 LAB — HEPARIN LEVEL (UNFRACTIONATED): Heparin Unfractionated: 0.17 IU/mL — ABNORMAL LOW (ref 0.30–0.70)

## 2014-11-04 SURGERY — LEFT HEART CATH AND CORONARY ANGIOGRAPHY
Anesthesia: LOCAL

## 2014-11-04 MED ORDER — LIDOCAINE HCL (PF) 1 % IJ SOLN
INTRAMUSCULAR | Status: DC | PRN
  Administered 2014-11-04: 5 mL via INTRADERMAL

## 2014-11-04 MED ORDER — LIDOCAINE HCL (PF) 1 % IJ SOLN
INTRAMUSCULAR | Status: AC
  Filled 2014-11-04: qty 30

## 2014-11-04 MED ORDER — SODIUM CHLORIDE 0.9 % WEIGHT BASED INFUSION: 1.0000 mL/kg/h | INTRAVENOUS | Status: AC

## 2014-11-04 MED ORDER — MIDAZOLAM HCL 2 MG/2ML IJ SOLN
INTRAMUSCULAR | Status: AC
  Filled 2014-11-04: qty 2

## 2014-11-04 MED ORDER — FENTANYL CITRATE (PF) 100 MCG/2ML IJ SOLN
INTRAMUSCULAR | Status: DC | PRN
  Administered 2014-11-04 (×2): 50 ug via INTRAVENOUS

## 2014-11-04 MED ORDER — LABETALOL HCL 5 MG/ML IV SOLN: 10.0000 mg | Freq: Four times a day (QID) | INTRAVENOUS | Status: DC | PRN

## 2014-11-04 MED ORDER — IOHEXOL 350 MG/ML SOLN
INTRAVENOUS | Status: DC | PRN
Start: 2014-11-04 — End: 2014-11-04
  Administered 2014-11-04: 235 mL via INTRA_ARTERIAL

## 2014-11-04 MED ORDER — TIROFIBAN HCL IV 5 MG/100ML
INTRAVENOUS | Status: DC | PRN
  Administered 2014-11-04: 0.15 ug/kg/min via INTRAVENOUS

## 2014-11-04 MED ORDER — SODIUM CHLORIDE 0.9 % IJ SOLN
3.0000 mL | Freq: Two times a day (BID) | INTRAMUSCULAR | Status: DC
  Administered 2014-11-05: 3 mL via INTRAVENOUS

## 2014-11-04 MED ORDER — NITROGLYCERIN 1 MG/10 ML FOR IR/CATH LAB
INTRA_ARTERIAL | Status: AC
  Filled 2014-11-04: qty 10

## 2014-11-04 MED ORDER — HEPARIN BOLUS VIA INFUSION
3000.0000 [IU] | Freq: Once | INTRAVENOUS | Status: AC
  Administered 2014-11-04: 3000 [IU] via INTRAVENOUS
  Filled 2014-11-04: qty 3000

## 2014-11-04 MED ORDER — MIDAZOLAM HCL 2 MG/2ML IJ SOLN
INTRAMUSCULAR | Status: DC | PRN
  Administered 2014-11-04: 2 mg via INTRAVENOUS
  Administered 2014-11-04: 1 mg via INTRAVENOUS

## 2014-11-04 MED ORDER — NITROGLYCERIN 1 MG/10 ML FOR IR/CATH LAB
INTRA_ARTERIAL | Status: DC | PRN
Start: 2014-11-04 — End: 2014-11-04
  Administered 2014-11-04: 200 ug via INTRACORONARY

## 2014-11-04 MED ORDER — NITROGLYCERIN IN D5W 200-5 MCG/ML-% IV SOLN: 3.0000 ug/min | INTRAVENOUS | Status: DC

## 2014-11-04 MED ORDER — HEPARIN SODIUM (PORCINE) 1000 UNIT/ML IJ SOLN
INTRAMUSCULAR | Status: AC
  Filled 2014-11-04: qty 1

## 2014-11-04 MED ORDER — FENTANYL CITRATE (PF) 100 MCG/2ML IJ SOLN
INTRAMUSCULAR | Status: AC
Start: 2014-11-04 — End: 2014-11-04
  Filled 2014-11-04: qty 2

## 2014-11-04 MED ORDER — HEPARIN SODIUM (PORCINE) 1000 UNIT/ML IJ SOLN
INTRAMUSCULAR | Status: DC | PRN
  Administered 2014-11-04: 6000 [IU] via INTRAVENOUS
  Administered 2014-11-04: 3000 [IU] via INTRAVENOUS

## 2014-11-04 MED ORDER — HEPARIN (PORCINE) IN NACL 2-0.9 UNIT/ML-% IJ SOLN
INTRAMUSCULAR | Status: AC
  Filled 2014-11-04: qty 1000

## 2014-11-04 MED ORDER — SODIUM CHLORIDE 0.9 % IV SOLN
INTRAVENOUS | Status: DC | PRN
  Administered 2014-11-04: 125 mL/h via INTRAVENOUS

## 2014-11-04 MED ORDER — SODIUM CHLORIDE 0.9 % IJ SOLN: 3.0000 mL | INTRAMUSCULAR | Status: DC | PRN

## 2014-11-04 MED ORDER — NITROGLYCERIN IN D5W 200-5 MCG/ML-% IV SOLN: INTRAVENOUS | Status: DC | PRN

## 2014-11-04 MED ORDER — RADIAL COCKTAIL (HEPARIN/VERAPAMIL/LIDOCAINE/NITRO)
Status: DC | PRN
  Administered 2014-11-04: 1 via INTRA_ARTERIAL

## 2014-11-04 MED ORDER — SODIUM CHLORIDE 0.9 % IV SOLN: 250.0000 mL | INTRAVENOUS | Status: DC | PRN

## 2014-11-04 MED ORDER — VERAPAMIL HCL 2.5 MG/ML IV SOLN
INTRAVENOUS | Status: AC
  Filled 2014-11-04: qty 2

## 2014-11-04 MED FILL — Hydromorphone HCl Inj 2 MG/ML: INTRAMUSCULAR | Qty: 1 | Status: AC

## 2014-11-04 SURGICAL SUPPLY — 26 items
BALLN ANGIOSCULPT RX 2.5X6 (BALLOONS) ×2
BALLN EMERGE MR 2.5X20 (BALLOONS) ×2
BALLN MINITREK RX 2.0X12 (BALLOONS) ×2
BALLN ~~LOC~~ TREK RX 4.0X20 (BALLOONS) ×1 IMPLANT
BALLOON ANGIOSCULPT RX 2.5X6 (BALLOONS) IMPLANT
BALLOON EMERGE MR 2.5X20 (BALLOONS) IMPLANT
BALLOON MINITREK RX 2.0X12 (BALLOONS) IMPLANT
CATH INFINITI 5FR ANG PIGTAIL (CATHETERS) ×2 IMPLANT
CATH INFINITI 5FR MULTPACK ANG (CATHETERS) IMPLANT
CATH OPTITORQUE TIG 4.0 5F (CATHETERS) ×2 IMPLANT
CATH VISTA GUIDE 6FR XBLAD3.5 (CATHETERS) ×1 IMPLANT
DEVICE RAD COMP TR BAND LRG (VASCULAR PRODUCTS) ×2 IMPLANT
GLIDESHEATH SLEND A-KIT 6F 22G (SHEATH) ×2 IMPLANT
KIT ENCORE 26 ADVANTAGE (KITS) ×1 IMPLANT
KIT ESSENTIALS PG (KITS) ×1 IMPLANT
KIT HEART LEFT (KITS) ×2 IMPLANT
PACK CARDIAC CATHETERIZATION (CUSTOM PROCEDURE TRAY) ×2 IMPLANT
SHEATH PINNACLE 5F 10CM (SHEATH) IMPLANT
STENT XIENCE ALPINE RX 3.5X28 (Permanent Stent) ×1 IMPLANT
SYR MEDRAD MARK V 150ML (SYRINGE) ×2 IMPLANT
TRANSDUCER W/STOPCOCK (MISCELLANEOUS) ×2 IMPLANT
TUBING CIL FLEX 10 FLL-RA (TUBING) ×2 IMPLANT
WIRE ASAHI PROWATER 180CM (WIRE) ×1 IMPLANT
WIRE EMERALD 3MM-J .035X150CM (WIRE) IMPLANT
WIRE HI TORQ BMW 190CM (WIRE) ×1 IMPLANT
WIRE SAFE-T 1.5MM-J .035X260CM (WIRE) ×2 IMPLANT

## 2014-11-04 NOTE — Progress Notes (Signed)
DAILY PROGRESS NOTE  Subjective:  Report minimal chest pain, improved with aggrestat overnight. Troponin peaked at 3.65 and is trending down.  Objective:  Temp:  [97.9 F (36.6 C)-99.1 F (37.3 C)] 98.4 F (36.9 C) (07/05 0723) Pulse Rate:  [74-104] 91 (07/05 0723) Resp:  [11-24] 16 (07/05 0723) BP: (116-179)/(45-118) 142/77 mmHg (07/05 0723) SpO2:  [90 %-98 %] 96 % (07/05 0723) Weight:  [260 lb (117.935 kg)-276 lb 3.2 oz (125.283 kg)] 276 lb 3.2 oz (125.283 kg) (07/04 1424) Weight change:   Intake/Output from previous day: 07/04 0701 - 07/05 0700 In: 2195.9 [I.V.:2195.9] Out: 2600 [Urine:2600]  Intake/Output from this shift: Total I/O In: 173.1 [I.V.:173.1] Out: 100 [Urine:100]  Medications: Current Facility-Administered Medications  Medication Dose Route Frequency Provider Last Rate Last Dose  . 0.9 %  sodium chloride infusion   Intravenous Continuous Isaiah Serge, NP 10 mL/hr at 11/03/14 1615    . 0.9 %  sodium chloride infusion  250 mL Intravenous PRN Isaiah Serge, NP      . 0.9% sodium chloride infusion  1 mL/kg/hr Intravenous Continuous Isaiah Serge, NP 125.3 mL/hr at 11/04/14 0800 1 mL/kg/hr at 11/04/14 0800  . acetaminophen (TYLENOL) tablet 650 mg  650 mg Oral Q4H PRN Isaiah Serge, NP      . ALPRAZolam Duanne Moron) tablet 0.25 mg  0.25 mg Oral BID PRN Isaiah Serge, NP      . atorvastatin (LIPITOR) tablet 40 mg  40 mg Oral q1800 Isaiah Serge, NP   40 mg at 11/03/14 1736  . heparin ADULT infusion 100 units/mL (25000 units/250 mL)  2,100 Units/hr Intravenous Continuous Laren Everts, RPH 21 mL/hr at 11/04/14 0826 2,100 Units/hr at 11/04/14 0826  . HYDROmorphone (DILAUDID) injection 0.5 mg  0.5 mg Intravenous Q4H PRN Isaiah Serge, NP   0.5 mg at 11/03/14 1712  . metoprolol tartrate (LOPRESSOR) tablet 25 mg  25 mg Oral BID Wandra Mannan, MD   25 mg at 11/03/14 2137  . nitroGLYCERIN (NITROSTAT) SL tablet 0.4 mg  0.4 mg Sublingual Q5 min PRN Tammy Triplett, PA-C    0.4 mg at 11/03/14 1017  . nitroGLYCERIN (NITROSTAT) SL tablet 0.4 mg  0.4 mg Sublingual Q5 Min x 3 PRN Isaiah Serge, NP      . nitroGLYCERIN 50 mg in dextrose 5 % 250 mL (0.2 mg/mL) infusion  5-200 mcg/min Intravenous Titrated Tammy Triplett, PA-C 1.5 mL/hr at 11/03/14 1210 5 mcg/min at 11/03/14 1210  . nitroGLYCERIN 50 mg in dextrose 5 % 250 mL (0.2 mg/mL) infusion  3-30 mcg/min Intravenous Titrated Isaiah Serge, NP 3 mL/hr at 11/04/14 0700 10 mcg/min at 11/04/14 0700  . ondansetron (ZOFRAN) injection 4 mg  4 mg Intravenous Q6H PRN Isaiah Serge, NP      . prasugrel (EFFIENT) tablet 10 mg  10 mg Oral Daily Isaiah Serge, NP      . sodium chloride 0.9 % injection 3 mL  3 mL Intravenous Q12H Isaiah Serge, NP   3 mL at 11/04/14 0752  . sodium chloride 0.9 % injection 3 mL  3 mL Intravenous PRN Isaiah Serge, NP      . tirofiban (AGGRASTAT) infusion 50 mcg/mL 100 mL  0.15 mcg/kg/min Intravenous Continuous Arnoldo Lenis, MD 22.6 mL/hr at 11/04/14 0751 0.15 mcg/kg/min at 11/04/14 0751  . zolpidem (AMBIEN) tablet 5 mg  5 mg Oral QHS PRN,MR X 1 Isaiah Serge, NP  Physical Exam: General appearance: alert and no distress Lungs: clear to auscultation bilaterally Heart: regular rate and rhythm, S1, S2 normal, no murmur, click, rub or gallop Extremities: extremities normal, atraumatic, no cyanosis or edema Neurologic: Grossly normal  Lab Results: Results for orders placed or performed during the hospital encounter of 11/03/14 (from the past 48 hour(s))  Troponin I     Status: Abnormal   Collection Time: 11/03/14  9:28 AM  Result Value Ref Range   Troponin I 0.08 (H) <0.031 ng/mL    Comment:        PERSISTENTLY INCREASED TROPONIN VALUES IN THE RANGE OF 0.04-0.49 ng/mL CAN BE SEEN IN:       -UNSTABLE ANGINA       -CONGESTIVE HEART FAILURE       -MYOCARDITIS       -CHEST TRAUMA       -ARRYHTHMIAS       -LATE PRESENTING MYOCARDIAL INFARCTION       -COPD   CLINICAL FOLLOW-UP  RECOMMENDED.   Basic metabolic panel     Status: Abnormal   Collection Time: 11/03/14  9:28 AM  Result Value Ref Range   Sodium 138 135 - 145 mmol/L   Potassium 3.4 (L) 3.5 - 5.1 mmol/L   Chloride 100 (L) 101 - 111 mmol/L   CO2 28 22 - 32 mmol/L   Glucose, Bld 108 (H) 65 - 99 mg/dL   BUN 9 6 - 20 mg/dL   Creatinine, Ser 0.85 0.61 - 1.24 mg/dL   Calcium 9.5 8.9 - 10.3 mg/dL   GFR calc non Af Amer >60 >60 mL/min   GFR calc Af Amer >60 >60 mL/min    Comment: (NOTE) The eGFR has been calculated using the CKD EPI equation. This calculation has not been validated in all clinical situations. eGFR's persistently <60 mL/min signify possible Chronic Kidney Disease.    Anion gap 10 5 - 15  CBC     Status: Abnormal   Collection Time: 11/03/14  9:28 AM  Result Value Ref Range   WBC 16.6 (H) 4.0 - 10.5 K/uL   RBC 5.58 4.22 - 5.81 MIL/uL   Hemoglobin 18.4 (H) 13.0 - 17.0 g/dL   HCT 53.2 (H) 39.0 - 52.0 %   MCV 95.3 78.0 - 100.0 fL   MCH 33.0 26.0 - 34.0 pg   MCHC 34.6 30.0 - 36.0 g/dL   RDW 12.7 11.5 - 15.5 %   Platelets 190 150 - 400 K/uL  MRSA PCR Screening     Status: None   Collection Time: 11/03/14  2:20 PM  Result Value Ref Range   MRSA by PCR NEGATIVE NEGATIVE    Comment:        The GeneXpert MRSA Assay (FDA approved for NASAL specimens only), is one component of a comprehensive MRSA colonization surveillance program. It is not intended to diagnose MRSA infection nor to guide or monitor treatment for MRSA infections.   Magnesium     Status: None   Collection Time: 11/03/14  6:20 PM  Result Value Ref Range   Magnesium 1.8 1.7 - 2.4 mg/dL  TSH     Status: None   Collection Time: 11/03/14  6:20 PM  Result Value Ref Range   TSH 1.167 0.350 - 4.500 uIU/mL  T4, free     Status: None   Collection Time: 11/03/14  6:20 PM  Result Value Ref Range   Free T4 0.99 0.61 - 1.12 ng/dL  Troponin I  Status: Abnormal   Collection Time: 11/03/14  6:20 PM  Result Value Ref Range     Troponin I 2.63 (HH) <0.031 ng/mL    Comment:        POSSIBLE MYOCARDIAL ISCHEMIA. SERIAL TESTING RECOMMENDED. REPEATED TO VERIFY CRITICAL RESULT CALLED TO, READ BACK BY AND VERIFIED WITH: Lavera Guise [id number] 11/03/2014 WBOND   Protime-INR     Status: None   Collection Time: 11/03/14  6:20 PM  Result Value Ref Range   Prothrombin Time 13.6 11.6 - 15.2 seconds   INR 1.02 0.00 - 1.49  Heparin level (unfractionated)     Status: Abnormal   Collection Time: 11/03/14  6:20 PM  Result Value Ref Range   Heparin Unfractionated <0.10 (L) 0.30 - 0.70 IU/mL    Comment:        IF HEPARIN RESULTS ARE BELOW EXPECTED VALUES, AND PATIENT DOSAGE HAS BEEN CONFIRMED, SUGGEST FOLLOW UP TESTING OF ANTITHROMBIN III LEVELS.   Troponin I     Status: Abnormal   Collection Time: 11/03/14 10:32 PM  Result Value Ref Range   Troponin I 3.65 (HH) <0.031 ng/mL    Comment:        POSSIBLE MYOCARDIAL ISCHEMIA. SERIAL TESTING RECOMMENDED. REPEATED TO VERIFY CRITICAL VALUE NOTED.  VALUE IS CONSISTENT WITH PREVIOUSLY REPORTED AND CALLED VALUE.   Troponin I     Status: Abnormal   Collection Time: 11/04/14  3:30 AM  Result Value Ref Range   Troponin I 3.50 (HH) <0.031 ng/mL    Comment:        POSSIBLE MYOCARDIAL ISCHEMIA. SERIAL TESTING RECOMMENDED. REPEATED TO VERIFY CRITICAL VALUE NOTED.  VALUE IS CONSISTENT WITH PREVIOUSLY REPORTED AND CALLED VALUE.   CBC     Status: Abnormal   Collection Time: 11/04/14  3:30 AM  Result Value Ref Range   WBC 12.9 (H) 4.0 - 10.5 K/uL   RBC 5.09 4.22 - 5.81 MIL/uL   Hemoglobin 16.6 13.0 - 17.0 g/dL   HCT 48.6 39.0 - 52.0 %   MCV 95.5 78.0 - 100.0 fL   MCH 32.6 26.0 - 34.0 pg   MCHC 34.2 30.0 - 36.0 g/dL   RDW 13.0 11.5 - 15.5 %   Platelets 189 150 - 400 K/uL  Basic metabolic panel     Status: Abnormal   Collection Time: 11/04/14  3:30 AM  Result Value Ref Range   Sodium 140 135 - 145 mmol/L   Potassium 4.0 3.5 - 5.1 mmol/L   Chloride 106 101 - 111 mmol/L    CO2 26 22 - 32 mmol/L   Glucose, Bld 99 65 - 99 mg/dL   BUN 5 (L) 6 - 20 mg/dL   Creatinine, Ser 0.72 0.61 - 1.24 mg/dL   Calcium 9.0 8.9 - 10.3 mg/dL   GFR calc non Af Amer >60 >60 mL/min   GFR calc Af Amer >60 >60 mL/min    Comment: (NOTE) The eGFR has been calculated using the CKD EPI equation. This calculation has not been validated in all clinical situations. eGFR's persistently <60 mL/min signify possible Chronic Kidney Disease.    Anion gap 8 5 - 15  Heparin level (unfractionated)     Status: Abnormal   Collection Time: 11/04/14  3:30 AM  Result Value Ref Range   Heparin Unfractionated 0.17 (L) 0.30 - 0.70 IU/mL    Comment:        IF HEPARIN RESULTS ARE BELOW EXPECTED VALUES, AND PATIENT DOSAGE HAS BEEN CONFIRMED, SUGGEST FOLLOW UP TESTING OF  ANTITHROMBIN III LEVELS.   Lipid panel     Status: Abnormal   Collection Time: 11/04/14  3:30 AM  Result Value Ref Range   Cholesterol 169 0 - 200 mg/dL   Triglycerides 300 (H) <150 mg/dL   HDL 39 (L) >40 mg/dL   Total CHOL/HDL Ratio 4.3 RATIO   VLDL 60 (H) 0 - 40 mg/dL   LDL Cholesterol 70 0 - 99 mg/dL    Comment:        Total Cholesterol/HDL:CHD Risk Coronary Heart Disease Risk Table                     Men   Women  1/2 Average Risk   3.4   3.3  Average Risk       5.0   4.4  2 X Average Risk   9.6   7.1  3 X Average Risk  23.4   11.0        Use the calculated Patient Ratio above and the CHD Risk Table to determine the patient's CHD Risk.        ATP III CLASSIFICATION (LDL):  <100     mg/dL   Optimal  100-129  mg/dL   Near or Above                    Optimal  130-159  mg/dL   Borderline  160-189  mg/dL   High  >190     mg/dL   Very High   Urinalysis, Routine w reflex microscopic (not at [facility])     Status: None   Collection Time: 11/04/14  7:55 AM  Result Value Ref Range   Color, Urine YELLOW YELLOW   APPearance CLEAR CLEAR   Specific Gravity, Urine 1.013 1.005 - 1.030   pH 8.0 5.0 - 8.0   Glucose, UA NEGATIVE  NEGATIVE mg/dL   Hgb urine dipstick NEGATIVE NEGATIVE   Bilirubin Urine NEGATIVE NEGATIVE   Ketones, ur NEGATIVE NEGATIVE mg/dL   Protein, ur NEGATIVE NEGATIVE mg/dL   Urobilinogen, UA 0.2 0.0 - 1.0 mg/dL   Nitrite NEGATIVE NEGATIVE   Leukocytes, UA NEGATIVE NEGATIVE    Comment: MICROSCOPIC NOT DONE ON URINES WITH NEGATIVE PROTEIN, BLOOD, LEUKOCYTES, NITRITE, OR GLUCOSE <1000 mg/dL.    Imaging: Dg Chest Port 1 View  11/03/2014   CLINICAL DATA:  Right-sided chest pain for 3 days. Initial encounter. Chest pain exacerbated by recumbent positioning.  EXAM: PORTABLE CHEST - 1 VIEW  COMPARISON:  06/21/2011.  06/20/2011.  FINDINGS: Low volume chest. Low volumes accentuate the cardiopericardial silhouette. The low volumes also produce crowding of the pulmonary vasculature. No focal consolidation. No gross pleural effusions.  IMPRESSION: Low volume chest.   Electronically Signed   By: Dereck Ligas M.D.   On: 11/03/2014 09:03    Assessment:  1. Principal Problem: 2.   NSTEMI (non-ST elevated myocardial infarction), 06/21/11 3. Active Problems: 4.   Status post coronary artery stent placement to LAD 06/21/11 5.   LV dysfunction, EF 40 % secondary to MI. 6.   Hyperlipidemia with target LDL less than 70 7.   CAD S/P percutaneous coronary angioplasty 8.   Ischemic cardiomyopathy 9.   Plan:  1. Plan for LHC today - required increased anti-platelet therapy overnight. NSTEMI, however, troponin trending down and for the most part chest pain free currently.  Time Spent Directly with Patient:  15 minutes  Length of Stay:  LOS: 1 day   Pixie Casino, MD,  [facility] Attending Cardiologist [place] 11/04/2014, 8:31 AM

## 2014-11-04 NOTE — Interval H&P Note (Signed)
History and Physical Interval Note:  11/04/2014 10:35 AM  Brian Caldwell  has presented today for surgery, with the diagnosis of NSTEMI  The various methods of treatment have been discussed with the patient and family. After consideration of risks, benefits and other options for treatment, the patient has consented to  Procedure(s): Left Heart Cath and Coronary Angiography (N/A) as a surgical intervention .  The patient's history has been reviewed, patient examined, no change in status, stable for surgery.  I have reviewed the patient's chart and labs.  Questions were answered to the patient's satisfaction.    Cath Lab Visit (complete for each Cath Lab visit)  Clinical Evaluation Leading to the Procedure:   ACS: Yes.    Non-ACS:    Anginal Classification: CCS IV  Anti-ischemic medical therapy: Minimal Therapy (1 class of medications)  Non-Invasive Test Results: No non-invasive testing performed  Prior CABG: No previous CABG    TIMI Score  Patient Information:  TIMI Score is 5  Revascularization of the presumed culprit artery  A (9)  Indication: 11; Score: 9 TIMI Score  Patient Information:  TIMI Score is 5  Revascularization of multiple coronary arteries when the culprit artery cannot clearly be determined  A (9)  Indication: 12; Score: 9   HARDING, DAVID W

## 2014-11-04 NOTE — H&P (View-Only) (Signed)
DAILY PROGRESS NOTE  Subjective:  Report minimal chest pain, improved with aggrestat overnight. Troponin peaked at 3.65 and is trending down.  Objective:  Temp:  [97.9 F (36.6 C)-99.1 F (37.3 C)] 98.4 F (36.9 C) (07/05 0723) Pulse Rate:  [74-104] 91 (07/05 0723) Resp:  [11-24] 16 (07/05 0723) BP: (116-179)/(45-118) 142/77 mmHg (07/05 0723) SpO2:  [90 %-98 %] 96 % (07/05 0723) Weight:  [260 lb (117.935 kg)-276 lb 3.2 oz (125.283 kg)] 276 lb 3.2 oz (125.283 kg) (07/04 1424) Weight change:   Intake/Output from previous day: 07/04 0701 - 07/05 0700 In: 2195.9 [I.V.:2195.9] Out: 2600 [Urine:2600]  Intake/Output from this shift: Total I/O In: 173.1 [I.V.:173.1] Out: 100 [Urine:100]  Medications: Current Facility-Administered Medications  Medication Dose Route Frequency Provider Last Rate Last Dose  . 0.9 %  sodium chloride infusion   Intravenous Continuous Isaiah Serge, NP 10 mL/hr at 11/03/14 1615    . 0.9 %  sodium chloride infusion  250 mL Intravenous PRN Isaiah Serge, NP      . 0.9% sodium chloride infusion  1 mL/kg/hr Intravenous Continuous Isaiah Serge, NP 125.3 mL/hr at 11/04/14 0800 1 mL/kg/hr at 11/04/14 0800  . acetaminophen (TYLENOL) tablet 650 mg  650 mg Oral Q4H PRN Isaiah Serge, NP      . ALPRAZolam Duanne Moron) tablet 0.25 mg  0.25 mg Oral BID PRN Isaiah Serge, NP      . atorvastatin (LIPITOR) tablet 40 mg  40 mg Oral q1800 Isaiah Serge, NP   40 mg at 11/03/14 1736  . heparin ADULT infusion 100 units/mL (25000 units/250 mL)  2,100 Units/hr Intravenous Continuous Laren Everts, RPH 21 mL/hr at 11/04/14 0826 2,100 Units/hr at 11/04/14 0826  . HYDROmorphone (DILAUDID) injection 0.5 mg  0.5 mg Intravenous Q4H PRN Isaiah Serge, NP   0.5 mg at 11/03/14 1712  . metoprolol tartrate (LOPRESSOR) tablet 25 mg  25 mg Oral BID Wandra Mannan, MD   25 mg at 11/03/14 2137  . nitroGLYCERIN (NITROSTAT) SL tablet 0.4 mg  0.4 mg Sublingual Q5 min PRN Tammy Triplett, PA-C    0.4 mg at 11/03/14 1017  . nitroGLYCERIN (NITROSTAT) SL tablet 0.4 mg  0.4 mg Sublingual Q5 Min x 3 PRN Isaiah Serge, NP      . nitroGLYCERIN 50 mg in dextrose 5 % 250 mL (0.2 mg/mL) infusion  5-200 mcg/min Intravenous Titrated Tammy Triplett, PA-C 1.5 mL/hr at 11/03/14 1210 5 mcg/min at 11/03/14 1210  . nitroGLYCERIN 50 mg in dextrose 5 % 250 mL (0.2 mg/mL) infusion  3-30 mcg/min Intravenous Titrated Isaiah Serge, NP 3 mL/hr at 11/04/14 0700 10 mcg/min at 11/04/14 0700  . ondansetron (ZOFRAN) injection 4 mg  4 mg Intravenous Q6H PRN Isaiah Serge, NP      . prasugrel (EFFIENT) tablet 10 mg  10 mg Oral Daily Isaiah Serge, NP      . sodium chloride 0.9 % injection 3 mL  3 mL Intravenous Q12H Isaiah Serge, NP   3 mL at 11/04/14 0752  . sodium chloride 0.9 % injection 3 mL  3 mL Intravenous PRN Isaiah Serge, NP      . tirofiban (AGGRASTAT) infusion 50 mcg/mL 100 mL  0.15 mcg/kg/min Intravenous Continuous Arnoldo Lenis, MD 22.6 mL/hr at 11/04/14 0751 0.15 mcg/kg/min at 11/04/14 0751  . zolpidem (AMBIEN) tablet 5 mg  5 mg Oral QHS PRN,MR X 1 Isaiah Serge, NP  Physical Exam: General appearance: alert and no distress Lungs: clear to auscultation bilaterally Heart: regular rate and rhythm, S1, S2 normal, no murmur, click, rub or gallop Extremities: extremities normal, atraumatic, no cyanosis or edema Neurologic: Grossly normal  Lab Results: Results for orders placed or performed during the hospital encounter of 11/03/14 (from the past 48 hour(s))  Troponin I     Status: Abnormal   Collection Time: 11/03/14  9:28 AM  Result Value Ref Range   Troponin I 0.08 (H) <0.031 ng/mL    Comment:        PERSISTENTLY INCREASED TROPONIN VALUES IN THE RANGE OF 0.04-0.49 ng/mL CAN BE SEEN IN:       -UNSTABLE ANGINA       -CONGESTIVE HEART FAILURE       -MYOCARDITIS       -CHEST TRAUMA       -ARRYHTHMIAS       -LATE PRESENTING MYOCARDIAL INFARCTION       -COPD   CLINICAL FOLLOW-UP  RECOMMENDED.   Basic metabolic panel     Status: Abnormal   Collection Time: 11/03/14  9:28 AM  Result Value Ref Range   Sodium 138 135 - 145 mmol/L   Potassium 3.4 (L) 3.5 - 5.1 mmol/L   Chloride 100 (L) 101 - 111 mmol/L   CO2 28 22 - 32 mmol/L   Glucose, Bld 108 (H) 65 - 99 mg/dL   BUN 9 6 - 20 mg/dL   Creatinine, Ser 0.85 0.61 - 1.24 mg/dL   Calcium 9.5 8.9 - 10.3 mg/dL   GFR calc non Af Amer >60 >60 mL/min   GFR calc Af Amer >60 >60 mL/min    Comment: (NOTE) The eGFR has been calculated using the CKD EPI equation. This calculation has not been validated in all clinical situations. eGFR's persistently <60 mL/min signify possible Chronic Kidney Disease.    Anion gap 10 5 - 15  CBC     Status: Abnormal   Collection Time: 11/03/14  9:28 AM  Result Value Ref Range   WBC 16.6 (H) 4.0 - 10.5 K/uL   RBC 5.58 4.22 - 5.81 MIL/uL   Hemoglobin 18.4 (H) 13.0 - 17.0 g/dL   HCT 53.2 (H) 39.0 - 52.0 %   MCV 95.3 78.0 - 100.0 fL   MCH 33.0 26.0 - 34.0 pg   MCHC 34.6 30.0 - 36.0 g/dL   RDW 12.7 11.5 - 15.5 %   Platelets 190 150 - 400 K/uL  MRSA PCR Screening     Status: None   Collection Time: 11/03/14  2:20 PM  Result Value Ref Range   MRSA by PCR NEGATIVE NEGATIVE    Comment:        The GeneXpert MRSA Assay (FDA approved for NASAL specimens only), is one component of a comprehensive MRSA colonization surveillance program. It is not intended to diagnose MRSA infection nor to guide or monitor treatment for MRSA infections.   Magnesium     Status: None   Collection Time: 11/03/14  6:20 PM  Result Value Ref Range   Magnesium 1.8 1.7 - 2.4 mg/dL  TSH     Status: None   Collection Time: 11/03/14  6:20 PM  Result Value Ref Range   TSH 1.167 0.350 - 4.500 uIU/mL  T4, free     Status: None   Collection Time: 11/03/14  6:20 PM  Result Value Ref Range   Free T4 0.99 0.61 - 1.12 ng/dL  Troponin I  Status: Abnormal   Collection Time: 11/03/14  6:20 PM  Result Value Ref Range     Troponin I 2.63 (HH) <0.031 ng/mL    Comment:        POSSIBLE MYOCARDIAL ISCHEMIA. SERIAL TESTING RECOMMENDED. REPEATED TO VERIFY CRITICAL RESULT CALLED TO, READ BACK BY AND VERIFIED WITH: Lavera Guise [id number] 11/03/2014 WBOND   Protime-INR     Status: None   Collection Time: 11/03/14  6:20 PM  Result Value Ref Range   Prothrombin Time 13.6 11.6 - 15.2 seconds   INR 1.02 0.00 - 1.49  Heparin level (unfractionated)     Status: Abnormal   Collection Time: 11/03/14  6:20 PM  Result Value Ref Range   Heparin Unfractionated <0.10 (L) 0.30 - 0.70 IU/mL    Comment:        IF HEPARIN RESULTS ARE BELOW EXPECTED VALUES, AND PATIENT DOSAGE HAS BEEN CONFIRMED, SUGGEST FOLLOW UP TESTING OF ANTITHROMBIN III LEVELS.   Troponin I     Status: Abnormal   Collection Time: 11/03/14 10:32 PM  Result Value Ref Range   Troponin I 3.65 (HH) <0.031 ng/mL    Comment:        POSSIBLE MYOCARDIAL ISCHEMIA. SERIAL TESTING RECOMMENDED. REPEATED TO VERIFY CRITICAL VALUE NOTED.  VALUE IS CONSISTENT WITH PREVIOUSLY REPORTED AND CALLED VALUE.   Troponin I     Status: Abnormal   Collection Time: 11/04/14  3:30 AM  Result Value Ref Range   Troponin I 3.50 (HH) <0.031 ng/mL    Comment:        POSSIBLE MYOCARDIAL ISCHEMIA. SERIAL TESTING RECOMMENDED. REPEATED TO VERIFY CRITICAL VALUE NOTED.  VALUE IS CONSISTENT WITH PREVIOUSLY REPORTED AND CALLED VALUE.   CBC     Status: Abnormal   Collection Time: 11/04/14  3:30 AM  Result Value Ref Range   WBC 12.9 (H) 4.0 - 10.5 K/uL   RBC 5.09 4.22 - 5.81 MIL/uL   Hemoglobin 16.6 13.0 - 17.0 g/dL   HCT 48.6 39.0 - 52.0 %   MCV 95.5 78.0 - 100.0 fL   MCH 32.6 26.0 - 34.0 pg   MCHC 34.2 30.0 - 36.0 g/dL   RDW 13.0 11.5 - 15.5 %   Platelets 189 150 - 400 K/uL  Basic metabolic panel     Status: Abnormal   Collection Time: 11/04/14  3:30 AM  Result Value Ref Range   Sodium 140 135 - 145 mmol/L   Potassium 4.0 3.5 - 5.1 mmol/L   Chloride 106 101 - 111 mmol/L    CO2 26 22 - 32 mmol/L   Glucose, Bld 99 65 - 99 mg/dL   BUN 5 (L) 6 - 20 mg/dL   Creatinine, Ser 0.72 0.61 - 1.24 mg/dL   Calcium 9.0 8.9 - 10.3 mg/dL   GFR calc non Af Amer >60 >60 mL/min   GFR calc Af Amer >60 >60 mL/min    Comment: (NOTE) The eGFR has been calculated using the CKD EPI equation. This calculation has not been validated in all clinical situations. eGFR's persistently <60 mL/min signify possible Chronic Kidney Disease.    Anion gap 8 5 - 15  Heparin level (unfractionated)     Status: Abnormal   Collection Time: 11/04/14  3:30 AM  Result Value Ref Range   Heparin Unfractionated 0.17 (L) 0.30 - 0.70 IU/mL    Comment:        IF HEPARIN RESULTS ARE BELOW EXPECTED VALUES, AND PATIENT DOSAGE HAS BEEN CONFIRMED, SUGGEST FOLLOW UP TESTING OF  ANTITHROMBIN III LEVELS.   Lipid panel     Status: Abnormal   Collection Time: 11/04/14  3:30 AM  Result Value Ref Range   Cholesterol 169 0 - 200 mg/dL   Triglycerides 300 (H) <150 mg/dL   HDL 39 (L) >40 mg/dL   Total CHOL/HDL Ratio 4.3 RATIO   VLDL 60 (H) 0 - 40 mg/dL   LDL Cholesterol 70 0 - 99 mg/dL    Comment:        Total Cholesterol/HDL:CHD Risk Coronary Heart Disease Risk Table                     Men   Women  1/2 Average Risk   3.4   3.3  Average Risk       5.0   4.4  2 X Average Risk   9.6   7.1  3 X Average Risk  23.4   11.0        Use the calculated Patient Ratio above and the CHD Risk Table to determine the patient's CHD Risk.        ATP III CLASSIFICATION (LDL):  <100     mg/dL   Optimal  100-129  mg/dL   Near or Above                    Optimal  130-159  mg/dL   Borderline  160-189  mg/dL   High  >190     mg/dL   Very High   Urinalysis, Routine w reflex microscopic (not at [facility])     Status: None   Collection Time: 11/04/14  7:55 AM  Result Value Ref Range   Color, Urine YELLOW YELLOW   APPearance CLEAR CLEAR   Specific Gravity, Urine 1.013 1.005 - 1.030   pH 8.0 5.0 - 8.0   Glucose, UA NEGATIVE  NEGATIVE mg/dL   Hgb urine dipstick NEGATIVE NEGATIVE   Bilirubin Urine NEGATIVE NEGATIVE   Ketones, ur NEGATIVE NEGATIVE mg/dL   Protein, ur NEGATIVE NEGATIVE mg/dL   Urobilinogen, UA 0.2 0.0 - 1.0 mg/dL   Nitrite NEGATIVE NEGATIVE   Leukocytes, UA NEGATIVE NEGATIVE    Comment: MICROSCOPIC NOT DONE ON URINES WITH NEGATIVE PROTEIN, BLOOD, LEUKOCYTES, NITRITE, OR GLUCOSE <1000 mg/dL.    Imaging: Dg Chest Port 1 View  11/03/2014   CLINICAL DATA:  Right-sided chest pain for 3 days. Initial encounter. Chest pain exacerbated by recumbent positioning.  EXAM: PORTABLE CHEST - 1 VIEW  COMPARISON:  06/21/2011.  06/20/2011.  FINDINGS: Low volume chest. Low volumes accentuate the cardiopericardial silhouette. The low volumes also produce crowding of the pulmonary vasculature. No focal consolidation. No gross pleural effusions.  IMPRESSION: Low volume chest.   Electronically Signed   By: Dereck Ligas M.D.   On: 11/03/2014 09:03    Assessment:  1. Principal Problem: 2.   NSTEMI (non-ST elevated myocardial infarction), 06/21/11 3. Active Problems: 4.   Status post coronary artery stent placement to LAD 06/21/11 5.   LV dysfunction, EF 40 % secondary to MI. 6.   Hyperlipidemia with target LDL less than 70 7.   CAD S/P percutaneous coronary angioplasty 8.   Ischemic cardiomyopathy 9.   Plan:  1. Plan for LHC today - required increased anti-platelet therapy overnight. NSTEMI, however, troponin trending down and for the most part chest pain free currently.  Time Spent Directly with Patient:  15 minutes  Length of Stay:  LOS: 1 day   Pixie Casino, MD,  [facility] Attending Cardiologist [place] 11/04/2014, 8:31 AM

## 2014-11-04 NOTE — Progress Notes (Signed)
Dilaudid was taken out of pyxis and wasted for pt. Pt was asleep in room. Dilaudid not given. Wasted with Karen Chafe RN.

## 2014-11-04 NOTE — Progress Notes (Signed)
ANTICOAGULATION CONSULT NOTE - Follow Up Consult  Pharmacy Consult for heparin Indication: NSTEMI   Labs:  Recent Labs  11/03/14 0928 11/03/14 1820 11/03/14 2232 11/04/14 0330  HGB 18.4*  --   --  16.6  HCT 53.2*  --   --  48.6  PLT 190  --   --  189  LABPROT  --  13.6  --   --   INR  --  1.02  --   --   HEPARINUNFRC  --  <0.10*  --  0.17*  CREATININE 0.85  --   --  0.72  TROPONINI 0.08* 2.63* 3.65* 3.50*    Assessment: 39yo male remains subtherapeutic on heparin after rate change though level is rising.  Goal of Therapy:  Heparin level 0.3-0.7 units/ml   Plan:  Will rebolus with heparin 3000 units and increase gtt by 3 units/kg/hr to 2100 units/hr and f/u after cath.  Vernard Gambles, PharmD, BCPS  11/04/2014,5:15 AM

## 2014-11-05 ENCOUNTER — Telehealth: Payer: Self-pay | Admitting: Cardiology

## 2014-11-05 ENCOUNTER — Inpatient Hospital Stay (HOSPITAL_COMMUNITY): Payer: Medicaid Other

## 2014-11-05 ENCOUNTER — Encounter (HOSPITAL_COMMUNITY): Payer: Self-pay | Admitting: Nurse Practitioner

## 2014-11-05 DIAGNOSIS — Z720 Tobacco use: Secondary | ICD-10-CM

## 2014-11-05 DIAGNOSIS — I10 Essential (primary) hypertension: Secondary | ICD-10-CM | POA: Diagnosis present

## 2014-11-05 DIAGNOSIS — I214 Non-ST elevation (NSTEMI) myocardial infarction: Secondary | ICD-10-CM

## 2014-11-05 DIAGNOSIS — I255 Ischemic cardiomyopathy: Secondary | ICD-10-CM

## 2014-11-05 DIAGNOSIS — Z9861 Coronary angioplasty status: Secondary | ICD-10-CM

## 2014-11-05 LAB — BASIC METABOLIC PANEL
Anion gap: 8 (ref 5–15)
Anion gap: 8 (ref 5–15)
BUN: 8 mg/dL (ref 6–20)
BUN: 7 mg/dL (ref 6–20)
CALCIUM: 8.9 mg/dL (ref 8.9–10.3)
CO2: 23 mmol/L (ref 22–32)
CO2: 24 mmol/L (ref 22–32)
CREATININE: 0.82 mg/dL (ref 0.61–1.24)
Calcium: 8.9 mg/dL (ref 8.9–10.3)
Chloride: 106 mmol/L (ref 101–111)
Chloride: 108 mmol/L (ref 101–111)
Creatinine, Ser: 0.75 mg/dL (ref 0.61–1.24)
GFR calc Af Amer: >60 mL/min (ref >60)
GFR calc Af Amer: >60 mL/min (ref >60)
GFR calc non Af Amer: >60 mL/min (ref >60)
GFR calc non Af Amer: >60 mL/min (ref >60)
Glucose, Bld: 111 mg/dL — ABNORMAL HIGH (ref 65–99)
Glucose, Bld: 114 mg/dL — ABNORMAL HIGH (ref 65–99)
Potassium: 5.6 mmol/L — ABNORMAL HIGH (ref 3.5–5.1)
Potassium: 3.7 mmol/L (ref 3.5–5.1)
Sodium: 137 mmol/L (ref 135–145)
Sodium: 140 mmol/L (ref 135–145)

## 2014-11-05 LAB — CBC
HCT: 49.1 % (ref 39.0–52.0)
Hemoglobin: 17.4 g/dL — ABNORMAL HIGH (ref 13.0–17.0)
MCH: 32.9 pg (ref 26.0–34.0)
MCHC: 35.4 g/dL (ref 30.0–36.0)
MCV: 92.8 fL (ref 78.0–100.0)
PLATELETS: 200 10*3/uL (ref 150–400)
RBC: 5.29 MIL/uL (ref 4.22–5.81)
RDW: 12.9 % (ref 11.5–15.5)
WBC: 14.8 10*3/uL — ABNORMAL HIGH (ref 4.0–10.5)

## 2014-11-05 MED ORDER — CARVEDILOL 6.25 MG PO TABS: 6.2500 mg | ORAL_TABLET | Freq: Two times a day (BID) | ORAL | Status: DC

## 2014-11-05 MED ORDER — LISINOPRIL 5 MG PO TABS
5.0000 mg | ORAL_TABLET | Freq: Every day | ORAL | Status: DC
  Administered 2014-11-05: 5 mg via ORAL
  Filled 2014-11-05: qty 1

## 2014-11-05 MED ORDER — PRASUGREL HCL 10 MG PO TABS: 10.0000 mg | ORAL_TABLET | Freq: Every day | ORAL | Status: DC

## 2014-11-05 MED ORDER — NITROGLYCERIN 0.4 MG SL SUBL: 0.4000 mg | SUBLINGUAL_TABLET | SUBLINGUAL | Status: DC | PRN

## 2014-11-05 MED ORDER — CARVEDILOL 6.25 MG PO TABS
6.2500 mg | ORAL_TABLET | Freq: Two times a day (BID) | ORAL | Status: DC
  Filled 2014-11-05 (×2): qty 1

## 2014-11-05 MED ORDER — FUROSEMIDE 20 MG PO TABS: 40.0000 mg | ORAL_TABLET | Freq: Every day | ORAL | Status: DC | PRN

## 2014-11-05 MED ORDER — ATORVASTATIN CALCIUM 80 MG PO TABS
80.0000 mg | ORAL_TABLET | Freq: Every day | ORAL | Status: DC
  Filled 2014-11-05: qty 1

## 2014-11-05 MED ORDER — LISINOPRIL 5 MG PO TABS: 5.0000 mg | ORAL_TABLET | Freq: Every day | ORAL | Status: DC

## 2014-11-05 MED ORDER — NICOTINE 14 MG/24HR TD PT24: 14.0000 mg | MEDICATED_PATCH | Freq: Every day | TRANSDERMAL | Status: DC

## 2014-11-05 MED ORDER — ATORVASTATIN CALCIUM 80 MG PO TABS: 80.0000 mg | ORAL_TABLET | Freq: Every day | ORAL | Status: DC

## 2014-11-05 NOTE — Telephone Encounter (Signed)
Unable to reach pt to confirm [facility] as VM not set up on his phone

## 2014-11-05 NOTE — Progress Notes (Signed)
Patient Name: Brian Caldwell Date of Encounter: 11/05/2014   Principal Problem:   NSTEMI, initial episode of care Active Problems:   CAD S/P percutaneous coronary angioplasty   Ischemic cardiomyopathy   Acute coronary syndrome   Tobacco abuse   Essential hypertension   Hyperlipidemia with target LDL less than 70    SUBJECTIVE  No chest pain or sob overnight.  Eager to go home.  CURRENT MEDS . atorvastatin  80 mg Oral q1800  . metoprolol tartrate  25 mg Oral BID  . prasugrel  10 mg Oral Daily  . sodium chloride  3 mL Intravenous Q12H    OBJECTIVE  Filed Vitals:   11/05/14 0500 11/05/14 0600 11/05/14 0700 11/05/14 0752  BP:    135/82  Pulse: 100 76 89 93  Temp:    98 F (36.7 C)  TempSrc:    Oral  Resp: 23 24 20 29   Height:      Weight:      SpO2: 96% 95% 95% 97%    Intake/Output Summary (Last 24 hours) at 11/05/14 0846 Last data filed at 11/05/14 0300  Gross per 24 hour  Intake 1820.93 ml  Output   2425 ml  Net -604.07 ml   Filed Weights   11/03/14 0842 11/03/14 1424  Weight: 260 lb (117.935 kg) 276 lb 3.2 oz (125.283 kg)    PHYSICAL EXAM  General: Pleasant, NAD. Neuro: Alert and oriented X 3. Moves all extremities spontaneously. Psych: Normal affect. HEENT:  Normal  Neck: Supple without bruits or JVD. Lungs:  Resp regular and unlabored, CTA. Heart: RRR no s3, s4, or murmurs. Abdomen: Soft, non-tender, non-distended, BS + x 4.  Extremities: No clubbing, cyanosis or edema. DP/PT/Radials 2+ and equal bilaterally. R wrist cath site w/o bleeding/bruit/hematoma.  Accessory Clinical Findings  CBC  Recent Labs  11/04/14 0330 11/05/14 0320  WBC 12.9* 14.8*  HGB 16.6 17.4*  HCT 48.6 49.1  MCV 95.5 92.8  PLT 189 200   Basic Metabolic Panel  Recent Labs  11/03/14 1820  11/05/14 0320 11/05/14 0731  NA  --   < > 137 140  K  --   < > 5.6* 3.7  CL  --   < > 106 108  CO2  --   < > 23 24  GLUCOSE  --   < > 111* 114*  BUN  --   < > 8 7    CREATININE  --   < > 0.82 0.75  CALCIUM  --   < > 8.9 8.9  MG 1.8  --   --   --   < > = values in this interval not displayed.  Cardiac Enzymes  Recent Labs  11/03/14 1820 11/03/14 2232 11/04/14 0330  TROPONINI 2.63* 3.65* 3.50*   Hemoglobin A1C  Recent Labs  11/03/14 1820  HGBA1C 5.3   Fasting Lipid Panel  Recent Labs  11/04/14 0330  CHOL 169  HDL 39*  LDLCALC 70  TRIG 454*  CHOLHDL 4.3   Thyroid Function Tests  Recent Labs  11/03/14 1820  TSH 1.167   TELE  RSR, occas pvc, 6 beats nsvt.  ECG  Rsr, 93, inf q's, antsept infarct - no acute st/t changes.  Radiology/Studies  Dg Chest Port 1 View  11/03/2014   CLINICAL DATA:  Right-sided chest pain for 3 days. Initial encounter. Chest pain exacerbated by recumbent positioning.  EXAM: PORTABLE CHEST - 1 VIEW  COMPARISON:  06/21/2011.  06/20/2011.  FINDINGS: Low  volume chest. Low volumes accentuate the cardiopericardial silhouette. The low volumes also produce crowding of the pulmonary vasculature. No focal consolidation. No gross pleural effusions.  IMPRESSION: Low volume chest.   Electronically Signed   By: Andreas Newport M.D.   On: 11/03/2014 09:03    ASSESSMENT AND PLAN  1.  NSTEMI/CAD:  S/P PCI/DES to the LCX and OM1 with additional PTCA of mid-LCX.  CTO of the RCA.  Patent LAD stent.  No chest pain or dyspnea overnight.  Cardiac rehab to see this AM.  If he ambulates w/o difficulty, can likely be discharged.  We discussed the importance of medication compliance.  I will arrange for f/u in [place] (his request) for next week.  2.  ICM:  EF prev >55% by echo in 08/2011, now 35-40% by echo this admission.  EDP 27 mmHg during cath yesterday though he is euvolemic on exam and asymptomatic. Switch bb to coreg.  Add acei - was prev on lisinopril but wasn't taking.  Will need f/u bmet @ f/u appt next week as he was hyperkalemic yesterday morning.  We discussed the importance of daily weights, sodium restriction,  medication compliance, and symptom reporting and he verbalizes understanding.   3.  Essential HTN:  BP stable this AM.  Beta blocker and acei as above.  4.  HL:  LDL 70.  Cont high potency statin.  5.  Tob Abuse:  Complete cessation advised.  Signed, Nicolasa Ducking NP

## 2014-11-05 NOTE — Discharge Instructions (Signed)

## 2014-11-05 NOTE — Telephone Encounter (Signed)
Patient being discharged today. Per [facility] TCM
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
## 2014-11-05 NOTE — Progress Notes (Signed)
CARDIAC REHAB PHASE I   PRE:  Rate/Rhythm: 105 ST  BP:  Supine:   Sitting: 140/79  Standing:    SaO2: 96%RA  MODE:  Ambulation: 700 ft   POST:  Rate/Rhythm: 110ST  BP:  Supine:   Sitting: 148/93  Standing:    SaO2: 96%RA 1041-1137 Pt walked 700 ft with steady gait. No CP. Tolerated well. MI education completed with pt who voiced understanding. Pt stated he has made some healthy dietary changes since last MI. Has not been able to quit smoking in the past. Gave pt fake cigarette and smoking cessation handout. Pt has tried chantix in the past that gave him bad dreams. Discussed nicotine patches. Discussed CRP 2 and pt declined at this time. Transportation may be a problem plus he takes care of his three year old. Will discuss with cardiologist if he is able to attend in future. Stressed importance of effient with stent and gave pt effient booklet. Pt stated he quit meds in past because he felt good and he was watching his diet. Stated it did not have anything to do with not being able to afford meds. Discussed with pt that he cannot stop meds without discussing with MD as they all have a purpose.    Luetta Nutting, RN BSN  11/05/2014 11:33 AM

## 2014-11-06 MED FILL — Heparin Sodium (Porcine) 2 Unit/ML in Sodium Chloride 0.9%: INTRAMUSCULAR | Qty: 1000 | Status: AC

## 2014-11-10 ENCOUNTER — Ambulatory Visit (INDEPENDENT_AMBULATORY_CARE_PROVIDER_SITE_OTHER): Payer: Medicaid Other | Admitting: Adult Health

## 2014-11-10 ENCOUNTER — Encounter: Payer: Self-pay | Admitting: Adult Health

## 2014-11-10 VITALS — BP 130/88 | HR 85 | Ht 72.0 in | Wt 273.0 lb

## 2014-11-10 DIAGNOSIS — I2510 Atherosclerotic heart disease of native coronary artery without angina pectoris: Secondary | ICD-10-CM | POA: Diagnosis not present

## 2014-11-10 DIAGNOSIS — I214 Non-ST elevation (NSTEMI) myocardial infarction: Secondary | ICD-10-CM

## 2014-11-10 MED ORDER — BUPROPION HCL ER (XL) 150 MG PO TB24: 150.0000 mg | ORAL_TABLET | Freq: Two times a day (BID) | ORAL | Status: DC

## 2014-11-10 MED ORDER — NICOTINE 14 MG/24HR TD PT24: 14.0000 mg | MEDICATED_PATCH | Freq: Every day | TRANSDERMAL | Status: DC
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
## 2014-11-10 NOTE — Patient Instructions (Signed)
Your physician recommends that you schedule a follow-up appointment in: 3 months.   Start Wellbutrin 150 mg 1 TABLET DAILY FOR 3 DAYS AND THEN START 1 TABLET 2 TIMES DAILY FOR 3 MONTHS  You have been referred to Cardiac Rehab  Thank you for choosing [facility]!
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
## 2015-02-15 ENCOUNTER — Emergency Department (HOSPITAL_COMMUNITY): Payer: Medicaid Other

## 2015-02-15 ENCOUNTER — Encounter (HOSPITAL_COMMUNITY): Payer: Self-pay | Admitting: Emergency Medicine

## 2015-02-15 ENCOUNTER — Emergency Department (HOSPITAL_COMMUNITY)
Admission: EM | Admit: 2015-02-15 | Discharge: 2015-02-15 | Disposition: A | Discharge status: Home / Self Care | Payer: Medicaid Other | Attending: Emergency Medicine | Admitting: Emergency Medicine

## 2015-02-15 DIAGNOSIS — R52 Pain, unspecified: Secondary | ICD-10-CM

## 2015-02-15 DIAGNOSIS — S63501A Unspecified sprain of right wrist, initial encounter: Secondary | ICD-10-CM | POA: Insufficient documentation

## 2015-02-15 DIAGNOSIS — S6991XA Unspecified injury of right wrist, hand and finger(s), initial encounter: Secondary | ICD-10-CM | POA: Diagnosis present

## 2015-02-15 DIAGNOSIS — Z720 Tobacco use: Secondary | ICD-10-CM | POA: Diagnosis not present

## 2015-02-15 DIAGNOSIS — Y9389 Activity, other specified: Secondary | ICD-10-CM | POA: Diagnosis not present

## 2015-02-15 DIAGNOSIS — Z8669 Personal history of other diseases of the nervous system and sense organs: Secondary | ICD-10-CM | POA: Diagnosis not present

## 2015-02-15 DIAGNOSIS — I10 Essential (primary) hypertension: Secondary | ICD-10-CM | POA: Insufficient documentation

## 2015-02-15 DIAGNOSIS — Z79899 Other long term (current) drug therapy: Secondary | ICD-10-CM | POA: Diagnosis not present

## 2015-02-15 DIAGNOSIS — Y998 Other external cause status: Secondary | ICD-10-CM | POA: Diagnosis not present

## 2015-02-15 DIAGNOSIS — Z9889 Other specified postprocedural states: Secondary | ICD-10-CM | POA: Diagnosis not present

## 2015-02-15 DIAGNOSIS — E785 Hyperlipidemia, unspecified: Secondary | ICD-10-CM | POA: Insufficient documentation

## 2015-02-15 DIAGNOSIS — Z7902 Long term (current) use of antithrombotics/antiplatelets: Secondary | ICD-10-CM | POA: Diagnosis not present

## 2015-02-15 DIAGNOSIS — I2510 Atherosclerotic heart disease of native coronary artery without angina pectoris: Secondary | ICD-10-CM | POA: Diagnosis not present

## 2015-02-15 DIAGNOSIS — M1990 Unspecified osteoarthritis, unspecified site: Secondary | ICD-10-CM | POA: Insufficient documentation

## 2015-02-15 DIAGNOSIS — Y9289 Other specified places as the place of occurrence of the external cause: Secondary | ICD-10-CM | POA: Diagnosis not present

## 2015-02-15 DIAGNOSIS — Z880 Allergy status to penicillin: Secondary | ICD-10-CM | POA: Insufficient documentation

## 2015-02-15 DIAGNOSIS — X58XXXA Exposure to other specified factors, initial encounter: Secondary | ICD-10-CM | POA: Diagnosis not present

## 2015-02-15 MED ORDER — OXYCODONE HCL 5 MG PO TABS: 5.0000 mg | ORAL_TABLET | ORAL | Status: DC | PRN

## 2015-02-15 MED ORDER — IBUPROFEN 800 MG PO TABS: 800.0000 mg | ORAL_TABLET | Freq: Once | ORAL | Status: DC

## 2015-02-15 NOTE — ED Notes (Signed)
Pt c/o right wrist and hand pain that occurred after 6 boards of plywood fell on it. Pt reports the pain worsens when he moves it. Pt has abrasion to posterior aspect of right middle finger. Radial pulse WNL. Slight swelling noted to posterior side of right wrist and hand.

## 2015-02-15 NOTE — ED Notes (Signed)
PA at bedside.

## 2015-02-15 NOTE — ED Notes (Signed)
Pt's left hand/wrist too large to fit into wrist splint. Pt was placed in wrist/forearm splint with good fit. Idol, PA notified and was ok with this.

## 2015-02-15 NOTE — ED Notes (Signed)
Patient c/o pain in right hand and wrist. Per patient had 6 pieces of sheet rock that were stacked up fall onto forearm, wrist, and hand after trying to catch it from falling.. Shearing noted to right middle finger. Per patient wrist bent downwards while trying to catch sheet rock.

## 2015-02-15 NOTE — Discharge Instructions (Signed)
Wrist Sprain With Rehab A sprain is an injury in which a ligament that maintains the proper alignment of a joint is partially or completely torn. The ligaments of the wrist are susceptible to sprains. Sprains are classified into three categories. Grade 1 sprains cause pain, but the tendon is not lengthened. Grade 2 sprains include a lengthened ligament because the ligament is stretched or partially ruptured. With grade 2 sprains there is still function, although the function may be diminished. Grade 3 sprains are characterized by a complete tear of the tendon or muscle, and function is usually impaired. SYMPTOMS   Pain tenderness, inflammation, and/or bruising (contusion) of the injury.  A "pop" or tear felt and/or heard at the time of injury.  Decreased wrist function. CAUSES  A wrist sprain occurs when a force is placed on one or more ligaments that is greater than it/they can withstand. Common mechanisms of injury include:  Catching a ball with your hands.  Repetitive and/ or strenuous extension or flexion of the wrist. RISK INCREASES WITH:  Previous wrist injury.  Contact sports (boxing or wrestling).  Activities in which falling is common.  Poor strength and flexibility.  Improperly fitted or padded protective equipment. PREVENTION  Warm up and stretch properly before activity.  Allow for adequate recovery between workouts.  Maintain physical fitness:  Strength, flexibility, and endurance.  Cardiovascular fitness.  Protect the wrist joint by limiting its motion with the use of taping, braces, or splints.  Protect the wrist after injury for 6 to 12 months. PROGNOSIS  The prognosis for wrist sprains depends on the degree of injury. Grade 1 sprains require 2 to 6 weeks of treatment. Grade 2 sprains require 6 to 8 weeks of treatment, and grade 3 sprains require up to 12 weeks.  RELATED COMPLICATIONS   Prolonged healing time, if improperly treated or  re-injured.  Recurrent symptoms that result in a chronic problem.  Injury to nearby structures (bone, cartilage, nerves, or tendons).  Arthritis of the wrist.  Inability to compete in athletics at a high level.  Wrist stiffness or weakness.  Progression to a complete rupture of the ligament. TREATMENT  Treatment initially involves resting from any activities that aggravate the symptoms, and the use of ice and medications to help reduce pain and inflammation. Your caregiver may recommend immobilizing the wrist for a period of time in order to reduce stress on the ligament and allow for healing. After immobilization it is important to perform strengthening and stretching exercises to help regain strength and a full range of motion. These exercises may be completed at home or with a therapist. Surgery is not usually required for wrist sprains, unless the ligament has been ruptured (grade 3 sprain). MEDICATION   If pain medication is necessary, then nonsteroidal anti-inflammatory medications, such as aspirin and ibuprofen, or other minor pain relievers, such as acetaminophen, are often recommended.  Do not take pain medication for 7 days before surgery.  Prescription pain relievers may be given if deemed necessary by your caregiver. Use only as directed and only as much as you need. HEAT AND COLD  Cold treatment (icing) relieves pain and reduces inflammation. Cold treatment should be applied for 10 to 15 minutes every 2 to 3 hours for inflammation and pain and immediately after any activity that aggravates your symptoms. Use ice packs or massage the area with a piece of ice (ice massage).  Heat treatment may be used prior to performing the stretching and strengthening activities prescribed by your  caregiver, physical therapist, or athletic trainer. Use a heat pack or soak your injury in warm water. SEEK MEDICAL CARE IF:  Treatment seems to offer no benefit, or the condition worsens.  Any  medications produce adverse side effects. EXERCISES RANGE OF MOTION (ROM) AND STRETCHING EXERCISES - Wrist Sprain  These exercises may help you when beginning to rehabilitate your injury. Your symptoms may resolve with or without further involvement from your physician, physical therapist or athletic trainer. While completing these exercises, remember:   Restoring tissue flexibility helps normal motion to return to the joints. This allows healthier, less painful movement and activity.  An effective stretch should be held for at least 30 seconds.  A stretch should never be painful. You should only feel a gentle lengthening or release in the stretched tissue. RANGE OF MOTION - Wrist Flexion, Active-Assisted  Extend your right / left elbow with your fingers pointing down.*  Gently pull the back of your hand towards you until you feel a gentle stretch on the top of your forearm.  Hold this position for __________ seconds. Repeat __________ times. Complete this exercise __________ times per day.  *If directed by your physician, physical therapist or athletic trainer, complete this stretch with your elbow bent rather than extended. RANGE OF MOTION - Wrist Extension, Active-Assisted  Extend your right / left elbow and turn your palm upwards.*  Gently pull your palm/fingertips back so your wrist extends and your fingers point more toward the ground.  You should feel a gentle stretch on the inside of your forearm.  Hold this position for __________ seconds. Repeat __________ times. Complete this exercise __________ times per day. *If directed by your physician, physical therapist or athletic trainer, complete this stretch with your elbow bent, rather than extended. RANGE OF MOTION - Supination, Active  Stand or sit with your elbows at your side. Bend your right / left elbow to 90 degrees.  Turn your palm upward until you feel a gentle stretch on the inside of your forearm.  Hold this  position for __________ seconds. Slowly release and return to the starting position. Repeat __________ times. Complete this stretch __________ times per day.  RANGE OF MOTION - Pronation, Active  Stand or sit with your elbows at your side. Bend your right / left elbow to 90 degrees.  Turn your palm downward until you feel a gentle stretch on the top of your forearm.  Hold this position for __________ seconds. Slowly release and return to the starting position. Repeat __________ times. Complete this stretch __________ times per day.  STRETCH - Wrist Flexion  Place the back of your right / left hand on a tabletop leaving your elbow slightly bent. Your fingers should point away from your body.  Gently press the back of your hand down onto the table by straightening your elbow. You should feel a stretch on the top of your forearm.  Hold this position for __________ seconds. Repeat __________ times. Complete this stretch __________ times per day.  STRETCH - Wrist Extension  Place your right / left fingertips on a tabletop leaving your elbow slightly bent. Your fingers should point backwards.  Gently press your fingers and palm down onto the table by straightening your elbow. You should feel a stretch on the inside of your forearm.  Hold this position for __________ seconds. Repeat __________ times. Complete this stretch __________ times per day.  STRENGTHENING EXERCISES - Wrist Sprain These exercises may help you when beginning to rehabilitate your injury.  They may resolve your symptoms with or without further involvement from your physician, physical therapist or athletic trainer. While completing these exercises, remember:   Muscles can gain both the endurance and the strength needed for everyday activities through controlled exercises.  Complete these exercises as instructed by your physician, physical therapist or athletic trainer. Progress with the resistance and repetition exercises  only as your caregiver advises. STRENGTH - Wrist Flexors  Sit with your right / left forearm palm-up and fully supported. Your elbow should be resting below the height of your shoulder. Allow your wrist to extend over the edge of the surface.  Loosely holding a __________ weight or a piece of rubber exercise band/tubing, slowly curl your hand up toward your forearm.  Hold this position for __________ seconds. Slowly lower the wrist back to the starting position in a controlled manner. Repeat __________ times. Complete this exercise __________ times per day.  STRENGTH - Wrist Extensors  Sit with your right / left forearm palm-down and fully supported. Your elbow should be resting below the height of your shoulder. Allow your wrist to extend over the edge of the surface.  Loosely holding a __________ weight or a piece of rubber exercise band/tubing, slowly curl your hand up toward your forearm.  Hold this position for __________ seconds. Slowly lower the wrist back to the starting position in a controlled manner. Repeat __________ times. Complete this exercise __________ times per day.  STRENGTH - Ulnar Deviators  Stand with a ____________________ weight in your right / left hand, or sit holding on to the rubber exercise band/tubing with your opposite arm supported.  Move your wrist so that your pinkie travels toward your forearm and your thumb moves away from your forearm.  Hold this position for __________ seconds and then slowly lower the wrist back to the starting position. Repeat __________ times. Complete this exercise __________ times per day STRENGTH - Radial Deviators  Stand with a ____________________ weight in your  right / left hand, or sit holding on to the rubber exercise band/tubing with your arm supported.  Raise your hand upward in front of you or pull up on the rubber tubing.  Hold this position for __________ seconds and then slowly lower the wrist back to the  starting position. Repeat __________ times. Complete this exercise __________ times per day. STRENGTH - Forearm Supinators  Sit with your right / left forearm supported on a table, keeping your elbow below shoulder height. Rest your hand over the edge, palm down.  Gently grip a hammer or a soup ladle.  Without moving your elbow, slowly turn your palm and hand upward to a "thumbs-up" position.  Hold this position for __________ seconds. Slowly return to the starting position. Repeat __________ times. Complete this exercise __________ times per day.  STRENGTH - Forearm Pronators  Sit with your right / left forearm supported on a table, keeping your elbow below shoulder height. Rest your hand over the edge, palm up.  Gently grip a hammer or a soup ladle.  Without moving your elbow, slowly turn your palm and hand upward to a "thumbs-up" position.  Hold this position for __________ seconds. Slowly return to the starting position. Repeat __________ times. Complete this exercise __________ times per day.  STRENGTH - Grip  Grasp a tennis ball, a dense sponge, or a large, rolled sock in your hand.  Squeeze as hard as you can without increasing any pain.  Hold this position for __________ seconds. Release your grip slowly.  Repeat __________ times. Complete this exercise __________ times per day.    This information is not intended to replace advice given to you by your health care provider. Make sure you discuss any questions you have with your health care provider.   You may take the oxycodone prescribed for pain relief.  This will make you drowsy - do not drive within 4 hours of taking this medication.  As discussed,  Do not take any more tylenol until after 4 pm today.  Only take up to 8 of the 500 mg strength tablets in a 24 hour period.
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
## 2015-04-03 ENCOUNTER — Ambulatory Visit: Payer: Medicaid Other | Admitting: Cardiology

## 2015-05-01 ENCOUNTER — Ambulatory Visit: Payer: Medicaid Other | Admitting: Cardiology

## 2015-05-15 ENCOUNTER — Ambulatory Visit: Payer: Self-pay | Admitting: Cardiology

## 2015-07-01 ENCOUNTER — Encounter: Payer: Self-pay | Admitting: Cardiology

## 2015-07-02 ENCOUNTER — Encounter: Payer: Self-pay | Admitting: Cardiology

## 2015-07-03 NOTE — Progress Notes (Signed)
Encounter opened in error

## 2016-10-22 ENCOUNTER — Encounter (HOSPITAL_COMMUNITY): Payer: Self-pay | Admitting: Emergency Medicine

## 2016-10-22 ENCOUNTER — Emergency Department (HOSPITAL_COMMUNITY)
Admission: EM | Admit: 2016-10-22 | Discharge: 2016-10-22 | Disposition: A | Discharge status: Home Health | Payer: Medicaid Other | Attending: Emergency Medicine | Admitting: Emergency Medicine

## 2016-10-22 DIAGNOSIS — Y999 Unspecified external cause status: Secondary | ICD-10-CM | POA: Diagnosis not present

## 2016-10-22 DIAGNOSIS — Z79899 Other long term (current) drug therapy: Secondary | ICD-10-CM | POA: Diagnosis not present

## 2016-10-22 DIAGNOSIS — S025XXA Fracture of tooth (traumatic), initial encounter for closed fracture: Secondary | ICD-10-CM | POA: Diagnosis not present

## 2016-10-22 DIAGNOSIS — K0889 Other specified disorders of teeth and supporting structures: Secondary | ICD-10-CM | POA: Diagnosis present

## 2016-10-22 DIAGNOSIS — K047 Periapical abscess without sinus: Secondary | ICD-10-CM

## 2016-10-22 DIAGNOSIS — Y9289 Other specified places as the place of occurrence of the external cause: Secondary | ICD-10-CM | POA: Insufficient documentation

## 2016-10-22 DIAGNOSIS — F17210 Nicotine dependence, cigarettes, uncomplicated: Secondary | ICD-10-CM | POA: Insufficient documentation

## 2016-10-22 DIAGNOSIS — I2510 Atherosclerotic heart disease of native coronary artery without angina pectoris: Secondary | ICD-10-CM | POA: Diagnosis not present

## 2016-10-22 DIAGNOSIS — X58XXXA Exposure to other specified factors, initial encounter: Secondary | ICD-10-CM | POA: Diagnosis not present

## 2016-10-22 DIAGNOSIS — Y939 Activity, unspecified: Secondary | ICD-10-CM | POA: Insufficient documentation

## 2016-10-22 DIAGNOSIS — I10 Essential (primary) hypertension: Secondary | ICD-10-CM | POA: Diagnosis not present

## 2016-10-22 MED ORDER — TRAMADOL HCL 50 MG PO TABS: 50.0000 mg | ORAL_TABLET | Freq: Four times a day (QID) | ORAL | 0 refills | Status: DC | PRN

## 2016-10-22 MED ORDER — ERYTHROMYCIN BASE 500 MG PO TABS: 500.0000 mg | ORAL_TABLET | Freq: Two times a day (BID) | ORAL | 0 refills | Status: DC

## 2016-10-22 MED ORDER — TRAMADOL HCL 50 MG PO TABS
50.0000 mg | ORAL_TABLET | Freq: Once | ORAL | Status: AC
  Administered 2016-10-22: 50 mg via ORAL
  Filled 2016-10-22: qty 1

## 2016-10-22 MED ORDER — ERYTHROMYCIN BASE 250 MG PO TABS
500.0000 mg | ORAL_TABLET | Freq: Once | ORAL | Status: AC
  Administered 2016-10-22: 500 mg via ORAL
  Filled 2016-10-22: qty 2

## 2016-10-22 NOTE — ED Triage Notes (Signed)
PT c/o pain to left front tooth x8 days unrelieved by ibuprofen.

## 2016-10-22 NOTE — Discharge Instructions (Signed)
Keep your appointment with your dentist on Monday as you have scheduled.   Complete your entire course of antibiotics as prescribed.  You  may use the tramadol for pain relief but do not drive within 4 hours of taking as this will make you drowsy.   The maximum safe dosing of ibuprofen is 800 mg every 8 hours.  Avoid applying heat or ice to this abscess area which can worsen your symptoms.  You may use warm salt water swish and spit treatment or half peroxide and water swish and spit after meals to keep this area clean as discussed.
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
## 2016-11-14 ENCOUNTER — Encounter (HOSPITAL_COMMUNITY): Payer: Self-pay

## 2016-11-14 ENCOUNTER — Emergency Department (HOSPITAL_COMMUNITY)
Admission: EM | Admit: 2016-11-14 | Discharge: 2016-11-15 | Disposition: A | Discharge status: Home / Self Care | Payer: Medicaid Other | Attending: Emergency Medicine | Admitting: Emergency Medicine

## 2016-11-14 DIAGNOSIS — T148XXA Other injury of unspecified body region, initial encounter: Secondary | ICD-10-CM

## 2016-11-14 DIAGNOSIS — Y939 Activity, unspecified: Secondary | ICD-10-CM | POA: Diagnosis not present

## 2016-11-14 DIAGNOSIS — Y929 Unspecified place or not applicable: Secondary | ICD-10-CM | POA: Diagnosis not present

## 2016-11-14 DIAGNOSIS — S91302A Unspecified open wound, left foot, initial encounter: Secondary | ICD-10-CM | POA: Insufficient documentation

## 2016-11-14 DIAGNOSIS — L089 Local infection of the skin and subcutaneous tissue, unspecified: Secondary | ICD-10-CM | POA: Diagnosis not present

## 2016-11-14 DIAGNOSIS — X58XXXA Exposure to other specified factors, initial encounter: Secondary | ICD-10-CM | POA: Insufficient documentation

## 2016-11-14 DIAGNOSIS — F17210 Nicotine dependence, cigarettes, uncomplicated: Secondary | ICD-10-CM | POA: Insufficient documentation

## 2016-11-14 DIAGNOSIS — I119 Hypertensive heart disease without heart failure: Secondary | ICD-10-CM | POA: Diagnosis not present

## 2016-11-14 DIAGNOSIS — Y999 Unspecified external cause status: Secondary | ICD-10-CM | POA: Insufficient documentation
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
## 2016-11-14 NOTE — ED Triage Notes (Signed)
Has chronic bed sore to left heel since 2005. Reports of foul odor x2 days.

## 2016-11-15 ENCOUNTER — Emergency Department (HOSPITAL_COMMUNITY): Payer: Medicaid Other

## 2016-11-15 MED ORDER — SILVER SULFADIAZINE 1 % EX CREA: 1.0000 "application " | TOPICAL_CREAM | Freq: Every day | CUTANEOUS | 0 refills | Status: DC

## 2016-11-15 MED ORDER — SILVER SULFADIAZINE 1 % EX CREA
TOPICAL_CREAM | Freq: Two times a day (BID) | CUTANEOUS | Status: DC
  Administered 2016-11-15: 01:00:00 via TOPICAL
  Filled 2016-11-15: qty 50

## 2016-11-15 MED ORDER — DOXYCYCLINE HYCLATE 100 MG PO CAPS: 100.0000 mg | ORAL_CAPSULE | Freq: Two times a day (BID) | ORAL | 0 refills | Status: DC

## 2016-11-15 MED ORDER — TRAMADOL HCL 50 MG PO TABS: 50.0000 mg | ORAL_TABLET | Freq: Four times a day (QID) | ORAL | 0 refills | Status: DC | PRN

## 2016-11-15 MED ORDER — DOXYCYCLINE HYCLATE 100 MG PO TABS
100.0000 mg | ORAL_TABLET | Freq: Once | ORAL | Status: AC
  Administered 2016-11-15: 100 mg via ORAL
  Filled 2016-11-15: qty 1

## 2016-12-27 ENCOUNTER — Encounter: Payer: Self-pay | Admitting: Adult Health

## 2016-12-27 ENCOUNTER — Ambulatory Visit (INDEPENDENT_AMBULATORY_CARE_PROVIDER_SITE_OTHER): Payer: Medicaid Other | Admitting: Adult Health

## 2016-12-27 VITALS — BP 150/102 | HR 120 | Ht 72.0 in | Wt 271.0 lb

## 2016-12-27 DIAGNOSIS — E785 Hyperlipidemia, unspecified: Secondary | ICD-10-CM

## 2016-12-27 DIAGNOSIS — Z720 Tobacco use: Secondary | ICD-10-CM

## 2016-12-27 DIAGNOSIS — S91302A Unspecified open wound, left foot, initial encounter: Secondary | ICD-10-CM

## 2016-12-27 DIAGNOSIS — I2510 Atherosclerotic heart disease of native coronary artery without angina pectoris: Secondary | ICD-10-CM | POA: Diagnosis not present

## 2016-12-27 DIAGNOSIS — I519 Heart disease, unspecified: Secondary | ICD-10-CM | POA: Diagnosis not present

## 2016-12-27 DIAGNOSIS — I10 Essential (primary) hypertension: Secondary | ICD-10-CM | POA: Diagnosis not present

## 2016-12-27 DIAGNOSIS — Z9119 Patient's noncompliance with other medical treatment and regimen: Secondary | ICD-10-CM

## 2016-12-27 DIAGNOSIS — Z91199 Patient's noncompliance with other medical treatment and regimen due to unspecified reason: Secondary | ICD-10-CM

## 2016-12-27 MED ORDER — ATORVASTATIN CALCIUM 80 MG PO TABS: 80.0000 mg | ORAL_TABLET | Freq: Every day | ORAL | 3 refills | Status: DC

## 2016-12-27 MED ORDER — LISINOPRIL 5 MG PO TABS: 5.0000 mg | ORAL_TABLET | Freq: Every day | ORAL | 3 refills | Status: DC

## 2016-12-27 MED ORDER — CARVEDILOL 6.25 MG PO TABS: 6.2500 mg | ORAL_TABLET | Freq: Two times a day (BID) | ORAL | 3 refills | Status: DC
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
## 2016-12-27 NOTE — Patient Instructions (Signed)
Medication Instructions:  Your physician has recommended you make the following change in your medication: Start Atorvastatin 80 mg Daily  Start Coreg 6.25 mg Two Times Daily  Start Lisinopril 5 mg Daily    Labwork: Your physician recommends that you return for lab work in: Today   Testing/Procedures: Your physician has requested that you have an echocardiogram. Echocardiography is a painless test that uses sound waves to create images of your heart. It provides your doctor with information about the size and shape of your heart and how well your heart's chambers and valves are working. This procedure takes approximately one hour. There are no restrictions for this procedure.    Follow-Up: Your physician recommends that you schedule a follow-up appointment in: 1 Month    Any Other Special Instructions Will Be Listed Below (If Applicable).  You have been referred to the wound center.     If you need a refill on your cardiac medications before your next appointment, please call your pharmacy.

## 2016-12-29 ENCOUNTER — Other Ambulatory Visit (HOSPITAL_COMMUNITY): Payer: Medicaid Other

## 2016-12-30 ENCOUNTER — Ambulatory Visit (HOSPITAL_COMMUNITY)
Admission: RE | Admit: 2016-12-30 | Discharge: 2016-12-30 | Disposition: A | Discharge status: Home / Self Care | Payer: Medicaid Other | Source: Ambulatory Visit | Attending: Adult Health | Admitting: Adult Health

## 2016-12-30 ENCOUNTER — Other Ambulatory Visit (HOSPITAL_COMMUNITY)
Admission: RE | Admit: 2016-12-30 | Discharge: 2016-12-30 | Disposition: A | Discharge status: Home / Self Care | Payer: Medicaid Other | Source: Ambulatory Visit | Attending: Adult Health | Admitting: Adult Health

## 2016-12-30 ENCOUNTER — Other Ambulatory Visit: Payer: Self-pay | Admitting: Adult Health

## 2016-12-30 DIAGNOSIS — I219 Acute myocardial infarction, unspecified: Secondary | ICD-10-CM | POA: Insufficient documentation

## 2016-12-30 DIAGNOSIS — Z720 Tobacco use: Secondary | ICD-10-CM | POA: Diagnosis not present

## 2016-12-30 DIAGNOSIS — I519 Heart disease, unspecified: Secondary | ICD-10-CM

## 2016-12-30 DIAGNOSIS — I209 Angina pectoris, unspecified: Secondary | ICD-10-CM | POA: Diagnosis not present

## 2016-12-30 DIAGNOSIS — E785 Hyperlipidemia, unspecified: Secondary | ICD-10-CM | POA: Insufficient documentation

## 2016-12-30 DIAGNOSIS — I10 Essential (primary) hypertension: Secondary | ICD-10-CM | POA: Insufficient documentation

## 2016-12-30 DIAGNOSIS — I119 Hypertensive heart disease without heart failure: Secondary | ICD-10-CM | POA: Diagnosis not present

## 2016-12-30 LAB — CBC WITH DIFFERENTIAL/PLATELET
BASOS PCT: 0 %
Basophils Absolute: 0 10*3/uL (ref 0.0–0.1)
Eosinophils Absolute: 0.2 10*3/uL (ref 0.0–0.7)
Eosinophils Relative: 2 %
HEMATOCRIT: 51.2 % (ref 39.0–52.0)
HEMOGLOBIN: 18 g/dL — AB (ref 13.0–17.0)
LYMPHS ABS: 2.7 10*3/uL (ref 0.7–4.0)
LYMPHS PCT: 27 %
MCH: 33 pg (ref 26.0–34.0)
MCHC: 35.2 g/dL (ref 30.0–36.0)
MCV: 93.9 fL (ref 78.0–100.0)
MONO ABS: 0.8 10*3/uL (ref 0.1–1.0)
MONOS PCT: 8 %
NEUTROS ABS: 6.5 10*3/uL (ref 1.7–7.7)
NEUTROS PCT: 63 %
Platelets: 184 10*3/uL (ref 150–400)
RBC: 5.45 MIL/uL (ref 4.22–5.81)
RDW: 12.8 % (ref 11.5–15.5)
WBC: 10.2 10*3/uL (ref 4.0–10.5)

## 2016-12-30 LAB — COMPREHENSIVE METABOLIC PANEL
ALBUMIN: 4.3 g/dL (ref 3.5–5.0)
ALK PHOS: 74 U/L (ref 38–126)
ALT: 24 U/L (ref 17–63)
ANION GAP: 10 (ref 5–15)
AST: 21 U/L (ref 15–41)
BILIRUBIN TOTAL: 0.6 mg/dL (ref 0.3–1.2)
BUN: 14 mg/dL (ref 6–20)
CALCIUM: 9.3 mg/dL (ref 8.9–10.3)
CO2: 23 mmol/L (ref 22–32)
Chloride: 105 mmol/L (ref 101–111)
Creatinine, Ser: 0.83 mg/dL (ref 0.61–1.24)
GFR calc Af Amer: >60 mL/min (ref >60)
GLUCOSE: 93 mg/dL (ref 65–99)
POTASSIUM: 3.8 mmol/L (ref 3.5–5.1)
Sodium: 138 mmol/L (ref 135–145)
TOTAL PROTEIN: 7.5 g/dL (ref 6.5–8.1)

## 2016-12-30 LAB — LIPID PANEL
Cholesterol: 197 mg/dL (ref 0–200)
HDL: 38 mg/dL — AB (ref >40)
LDL CALC: 83 mg/dL (ref 0–99)
Total CHOL/HDL Ratio: 5.2 RATIO
Triglycerides: 380 mg/dL — ABNORMAL HIGH (ref <150)
VLDL: 76 mg/dL — ABNORMAL HIGH (ref 0–40)

## 2016-12-30 LAB — TSH: TSH: 2.279 u[IU]/mL (ref 0.350–4.500)

## 2016-12-30 LAB — HEMOGLOBIN A1C
Hgb A1c MFr Bld: 5.1 % (ref 4.8–5.6)
Mean Plasma Glucose: 99.67 mg/dL

## 2016-12-30 LAB — MAGNESIUM: Magnesium: 2.1 mg/dL (ref 1.7–2.4)

## 2016-12-30 MED ORDER — NITROGLYCERIN 0.4 MG SL SUBL: 0.4000 mg | SUBLINGUAL_TABLET | SUBLINGUAL | 3 refills | Status: DC | PRN

## 2016-12-30 NOTE — Telephone Encounter (Signed)
°*  STAT* If patient is at the pharmacy, call can be transferred to refill team.   1. Which medications need to be refilled? (please list name of each medication and dose if known)  nitroGLYCERIN (NITROSTAT) 0.4 MG SL tablet [[id number]]    2. Which pharmacy/location (including street and city if local pharmacy) is medication to be sent to? [facility]  3. Do they need a 30 day or 90 day supply?  90 day   Pt is needing to be cleared for dental work, he's having some pain w/ his teeth and would like to know if [facility] can give Rx for antibiotic since he doesn't have a PCP.

## 2016-12-30 NOTE — Telephone Encounter (Signed)
Pt will see dentist for antibiotics

## 2016-12-30 NOTE — Progress Notes (Signed)
*  PRELIMINARY RESULTS* Echocardiogram 2D Echocardiogram has been performed.  Jeryl Columbia 12/30/2016, 1:53 PM

## 2017-01-04 ENCOUNTER — Telehealth: Payer: Self-pay | Admitting: *Deleted

## 2017-01-04 NOTE — Telephone Encounter (Signed)
-----   Message from Kathryn M Lawrence, NP sent at 12/31/2016 10:48 AM EDT ----- Very elevated cholesterol studies. I restarted him on his statin therapy. Please add Zetia 10 mg to his regimen. Thank you 

## 2017-01-04 NOTE — Telephone Encounter (Signed)
Called patient with test results. No answer. Unable to leave msg.  

## 2017-01-06 ENCOUNTER — Telehealth: Payer: Self-pay | Admitting: *Deleted

## 2017-01-06 MED ORDER — EZETIMIBE 10 MG PO TABS: 10.0000 mg | ORAL_TABLET | Freq: Every day | ORAL | 3 refills | Status: DC

## 2017-01-06 NOTE — Telephone Encounter (Signed)
-----   Message from Jodelle Gross, NP sent at 12/31/2016 10:48 AM EDT ----- Very elevated cholesterol studies. I restarted him on his statin therapy. Please add Zetia 10 mg to his regimen. Thank you

## 2017-01-17 ENCOUNTER — Encounter (HOSPITAL_BASED_OUTPATIENT_CLINIC_OR_DEPARTMENT_OTHER): Payer: Medicaid Other

## 2017-01-25 ENCOUNTER — Ambulatory Visit: Payer: Medicaid Other | Admitting: Physician Assistant
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
## 2017-02-01 ENCOUNTER — Ambulatory Visit: Payer: Self-pay | Admitting: Physician Assistant

## 2017-02-05 ENCOUNTER — Encounter (HOSPITAL_COMMUNITY): Payer: Self-pay | Admitting: *Deleted

## 2017-02-05 ENCOUNTER — Emergency Department (HOSPITAL_COMMUNITY)
Admission: EM | Admit: 2017-02-05 | Discharge: 2017-02-06 | Disposition: A | Discharge status: Home / Self Care | Payer: Medicaid Other | Attending: Emergency Medicine | Admitting: Emergency Medicine

## 2017-02-05 ENCOUNTER — Emergency Department (HOSPITAL_COMMUNITY): Payer: Medicaid Other

## 2017-02-05 DIAGNOSIS — I252 Old myocardial infarction: Secondary | ICD-10-CM | POA: Diagnosis not present

## 2017-02-05 DIAGNOSIS — Z955 Presence of coronary angioplasty implant and graft: Secondary | ICD-10-CM | POA: Diagnosis not present

## 2017-02-05 DIAGNOSIS — Z79899 Other long term (current) drug therapy: Secondary | ICD-10-CM | POA: Insufficient documentation

## 2017-02-05 DIAGNOSIS — I10 Essential (primary) hypertension: Secondary | ICD-10-CM | POA: Insufficient documentation

## 2017-02-05 DIAGNOSIS — I2510 Atherosclerotic heart disease of native coronary artery without angina pectoris: Secondary | ICD-10-CM | POA: Insufficient documentation

## 2017-02-05 DIAGNOSIS — F17210 Nicotine dependence, cigarettes, uncomplicated: Secondary | ICD-10-CM | POA: Insufficient documentation

## 2017-02-05 DIAGNOSIS — M79672 Pain in left foot: Secondary | ICD-10-CM | POA: Diagnosis present

## 2017-02-05 DIAGNOSIS — L03116 Cellulitis of left lower limb: Secondary | ICD-10-CM | POA: Diagnosis not present

## 2017-02-05 LAB — CBC WITH DIFFERENTIAL/PLATELET
BASOS PCT: 0 %
Basophils Absolute: 0 10*3/uL (ref 0.0–0.1)
EOS ABS: 0.2 10*3/uL (ref 0.0–0.7)
Eosinophils Relative: 2 %
HCT: 47.8 % (ref 39.0–52.0)
HEMOGLOBIN: 16.1 g/dL (ref 13.0–17.0)
Lymphocytes Relative: 32 %
Lymphs Abs: 3.1 10*3/uL (ref 0.7–4.0)
MCH: 32.2 pg (ref 26.0–34.0)
MCHC: 33.7 g/dL (ref 30.0–36.0)
MCV: 95.6 fL (ref 78.0–100.0)
MONO ABS: 0.9 10*3/uL (ref 0.1–1.0)
MONOS PCT: 9 %
NEUTROS PCT: 57 %
Neutro Abs: 5.6 10*3/uL (ref 1.7–7.7)
Platelets: 176 10*3/uL (ref 150–400)
RBC: 5 MIL/uL (ref 4.22–5.81)
RDW: 12.8 % (ref 11.5–15.5)
WBC: 9.7 10*3/uL (ref 4.0–10.5)

## 2017-02-05 LAB — BASIC METABOLIC PANEL
Anion gap: 9 (ref 5–15)
BUN: 12 mg/dL (ref 6–20)
CALCIUM: 9.4 mg/dL (ref 8.9–10.3)
CO2: 24 mmol/L (ref 22–32)
CREATININE: 0.86 mg/dL (ref 0.61–1.24)
Chloride: 112 mmol/L — ABNORMAL HIGH (ref 101–111)
GFR calc non Af Amer: >60 mL/min (ref >60)
Glucose, Bld: 105 mg/dL — ABNORMAL HIGH (ref 65–99)
Potassium: 3.9 mmol/L (ref 3.5–5.1)
SODIUM: 145 mmol/L (ref 135–145)

## 2017-02-05 LAB — LACTIC ACID, PLASMA: LACTIC ACID, VENOUS: 1 mmol/L (ref 0.5–1.9)

## 2017-02-05 NOTE — ED Triage Notes (Signed)
Pt c/o open wound to left heel that started over a year ago but has gotten worse recently,

## 2017-02-06 MED ORDER — DOXYCYCLINE HYCLATE 100 MG PO CAPS: 100.0000 mg | ORAL_CAPSULE | Freq: Two times a day (BID) | ORAL | 0 refills | Status: DC

## 2017-02-06 MED ORDER — HYDROCODONE-ACETAMINOPHEN 5-325 MG PO TABS
2.0000 | ORAL_TABLET | Freq: Once | ORAL | Status: AC
  Administered 2017-02-06: 2 via ORAL
  Filled 2017-02-06: qty 2

## 2017-02-06 MED ORDER — HYDROCODONE-ACETAMINOPHEN 5-325 MG PO TABS: 1.0000 | ORAL_TABLET | ORAL | 0 refills | Status: DC | PRN

## 2017-02-06 MED ORDER — DOXYCYCLINE HYCLATE 100 MG PO TABS
100.0000 mg | ORAL_TABLET | Freq: Once | ORAL | Status: AC
  Administered 2017-02-06: 100 mg via ORAL
  Filled 2017-02-06: qty 1
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
## 2017-02-06 NOTE — Discharge Instructions (Signed)
Soak foot in warm salt water 2 times a day. Make sure you go to wound care appointment. Prescription for antibiotic and pain medicine. Elevate foot.

## 2017-02-07 ENCOUNTER — Ambulatory Visit (INDEPENDENT_AMBULATORY_CARE_PROVIDER_SITE_OTHER): Payer: Medicaid Other | Admitting: Adult Health

## 2017-02-07 ENCOUNTER — Encounter: Payer: Self-pay | Admitting: Adult Health

## 2017-02-07 VITALS — BP 118/74 | HR 94 | Ht 72.0 in | Wt 266.0 lb

## 2017-02-07 DIAGNOSIS — Z9119 Patient's noncompliance with other medical treatment and regimen: Secondary | ICD-10-CM | POA: Diagnosis not present

## 2017-02-07 DIAGNOSIS — I2510 Atherosclerotic heart disease of native coronary artery without angina pectoris: Secondary | ICD-10-CM | POA: Diagnosis not present

## 2017-02-07 DIAGNOSIS — I10 Essential (primary) hypertension: Secondary | ICD-10-CM | POA: Diagnosis not present

## 2017-02-07 DIAGNOSIS — E7800 Pure hypercholesterolemia, unspecified: Secondary | ICD-10-CM | POA: Diagnosis not present

## 2017-02-07 DIAGNOSIS — Z91199 Patient's noncompliance with other medical treatment and regimen due to unspecified reason: Secondary | ICD-10-CM

## 2017-02-07 NOTE — Patient Instructions (Signed)
Your physician wants you to follow-up in: 4 months with Dr.Lawrence You will receive a reminder letter in the mail two months in advance. If you don't receive a letter, please call our office to schedule the follow-up appointment.   Your physician recommends that you continue on your current medications as directed. Please refer to the Current Medication list given to you today.     If you need a refill on your cardiac medications before your next appointment, please call your pharmacy.      No lab work or tests ordered today.    Thank you for choosing [facility] !
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
## 2017-02-10 ENCOUNTER — Ambulatory Visit (HOSPITAL_BASED_OUTPATIENT_CLINIC_OR_DEPARTMENT_OTHER): Payer: Medicaid Other

## 2017-03-20 ENCOUNTER — Encounter (HOSPITAL_BASED_OUTPATIENT_CLINIC_OR_DEPARTMENT_OTHER): Payer: Medicaid Other | Attending: Internal Medicine

## 2017-03-20 ENCOUNTER — Other Ambulatory Visit (HOSPITAL_COMMUNITY)
Admission: RE | Admit: 2017-03-20 | Discharge: 2017-03-20 | Disposition: A | Discharge status: Home / Self Care | Payer: Medicaid Other | Source: Other Acute Inpatient Hospital | Attending: Internal Medicine | Admitting: Internal Medicine

## 2017-03-20 DIAGNOSIS — L03114 Cellulitis of left upper limb: Secondary | ICD-10-CM | POA: Diagnosis not present

## 2017-03-20 DIAGNOSIS — I252 Old myocardial infarction: Secondary | ICD-10-CM | POA: Insufficient documentation

## 2017-03-20 DIAGNOSIS — I10 Essential (primary) hypertension: Secondary | ICD-10-CM | POA: Diagnosis not present

## 2017-03-20 DIAGNOSIS — I2510 Atherosclerotic heart disease of native coronary artery without angina pectoris: Secondary | ICD-10-CM | POA: Diagnosis not present

## 2017-03-20 DIAGNOSIS — Z955 Presence of coronary angioplasty implant and graft: Secondary | ICD-10-CM | POA: Diagnosis not present

## 2017-03-20 DIAGNOSIS — L89623 Pressure ulcer of left heel, stage 3: Secondary | ICD-10-CM | POA: Insufficient documentation

## 2017-03-20 DIAGNOSIS — F17210 Nicotine dependence, cigarettes, uncomplicated: Secondary | ICD-10-CM | POA: Diagnosis not present

## 2017-03-20 DIAGNOSIS — L97429 Non-pressure chronic ulcer of left heel and midfoot with unspecified severity: Secondary | ICD-10-CM | POA: Diagnosis not present

## 2017-03-21 ENCOUNTER — Other Ambulatory Visit: Payer: Self-pay | Admitting: Internal Medicine

## 2017-03-22 ENCOUNTER — Other Ambulatory Visit: Payer: Self-pay | Admitting: Internal Medicine

## 2017-03-22 DIAGNOSIS — L89623 Pressure ulcer of left heel, stage 3: Secondary | ICD-10-CM

## 2017-03-23 LAB — AEROBIC CULTURE  (SUPERFICIAL SPECIMEN)

## 2017-03-23 LAB — AEROBIC CULTURE W GRAM STAIN (SUPERFICIAL SPECIMEN)

## 2017-03-30 ENCOUNTER — Ambulatory Visit (HOSPITAL_COMMUNITY)
Admission: RE | Admit: 2017-03-30 | Discharge: 2017-03-30 | Disposition: A | Discharge status: Home / Self Care | Payer: Medicaid Other | Source: Ambulatory Visit | Attending: Internal Medicine | Admitting: Internal Medicine

## 2017-03-30 DIAGNOSIS — L89623 Pressure ulcer of left heel, stage 3: Secondary | ICD-10-CM | POA: Diagnosis present

## 2017-03-30 LAB — POCT I-STAT CREATININE: CREATININE: 0.8 mg/dL (ref 0.61–1.24)

## 2017-03-30 MED ORDER — GADOBENATE DIMEGLUMINE 529 MG/ML IV SOLN
20.0000 mL | Freq: Once | INTRAVENOUS | Status: AC | PRN
  Administered 2017-03-30: 20 mL via INTRAVENOUS

## 2017-03-31 DIAGNOSIS — L89623 Pressure ulcer of left heel, stage 3: Secondary | ICD-10-CM | POA: Diagnosis not present

## 2017-04-14 ENCOUNTER — Encounter (HOSPITAL_BASED_OUTPATIENT_CLINIC_OR_DEPARTMENT_OTHER): Payer: Medicaid Other | Attending: Internal Medicine

## 2017-04-14 ENCOUNTER — Other Ambulatory Visit (HOSPITAL_COMMUNITY)
Admit: 2017-04-14 | Discharge: 2017-04-14 | Disposition: A | Discharge status: Home / Self Care | Payer: Medicaid Other | Source: Other Acute Inpatient Hospital | Attending: Internal Medicine | Admitting: Internal Medicine

## 2017-04-14 DIAGNOSIS — I252 Old myocardial infarction: Secondary | ICD-10-CM | POA: Diagnosis not present

## 2017-04-14 DIAGNOSIS — B9561 Methicillin susceptible Staphylococcus aureus infection as the cause of diseases classified elsewhere: Secondary | ICD-10-CM | POA: Insufficient documentation

## 2017-04-14 DIAGNOSIS — L89623 Pressure ulcer of left heel, stage 3: Secondary | ICD-10-CM | POA: Insufficient documentation

## 2017-04-14 DIAGNOSIS — I10 Essential (primary) hypertension: Secondary | ICD-10-CM | POA: Diagnosis not present

## 2017-04-14 DIAGNOSIS — I2510 Atherosclerotic heart disease of native coronary artery without angina pectoris: Secondary | ICD-10-CM | POA: Diagnosis not present

## 2017-04-14 DIAGNOSIS — L97429 Non-pressure chronic ulcer of left heel and midfoot with unspecified severity: Secondary | ICD-10-CM | POA: Diagnosis not present

## 2017-04-20 LAB — AEROBIC CULTURE  (SUPERFICIAL SPECIMEN)

## 2017-04-20 LAB — AEROBIC CULTURE W GRAM STAIN (SUPERFICIAL SPECIMEN): Gram Stain: NONE SEEN

## 2017-04-28 DIAGNOSIS — L89623 Pressure ulcer of left heel, stage 3: Secondary | ICD-10-CM | POA: Diagnosis not present

## 2017-05-12 ENCOUNTER — Encounter (HOSPITAL_BASED_OUTPATIENT_CLINIC_OR_DEPARTMENT_OTHER): Payer: Medicaid Other | Attending: Internal Medicine

## 2017-05-12 DIAGNOSIS — I10 Essential (primary) hypertension: Secondary | ICD-10-CM | POA: Insufficient documentation

## 2017-05-12 DIAGNOSIS — I2510 Atherosclerotic heart disease of native coronary artery without angina pectoris: Secondary | ICD-10-CM | POA: Insufficient documentation

## 2017-05-12 DIAGNOSIS — L89623 Pressure ulcer of left heel, stage 3: Secondary | ICD-10-CM | POA: Insufficient documentation

## 2017-05-12 DIAGNOSIS — I252 Old myocardial infarction: Secondary | ICD-10-CM | POA: Insufficient documentation

## 2017-05-16 DIAGNOSIS — I252 Old myocardial infarction: Secondary | ICD-10-CM | POA: Diagnosis not present

## 2017-05-16 DIAGNOSIS — I2510 Atherosclerotic heart disease of native coronary artery without angina pectoris: Secondary | ICD-10-CM | POA: Diagnosis not present

## 2017-05-16 DIAGNOSIS — I10 Essential (primary) hypertension: Secondary | ICD-10-CM | POA: Diagnosis not present

## 2017-05-16 DIAGNOSIS — L89623 Pressure ulcer of left heel, stage 3: Secondary | ICD-10-CM | POA: Diagnosis not present

## 2017-05-29 ENCOUNTER — Encounter: Payer: Self-pay | Admitting: Podiatry

## 2017-05-29 ENCOUNTER — Ambulatory Visit: Payer: Medicaid Other | Admitting: Podiatry

## 2017-05-29 ENCOUNTER — Ambulatory Visit (INDEPENDENT_AMBULATORY_CARE_PROVIDER_SITE_OTHER): Payer: Medicaid Other

## 2017-05-29 VITALS — BP 151/103 | HR 84

## 2017-05-29 DIAGNOSIS — I83025 Varicose veins of left lower extremity with ulcer other part of foot: Secondary | ICD-10-CM | POA: Diagnosis not present

## 2017-05-29 DIAGNOSIS — L97521 Non-pressure chronic ulcer of other part of left foot limited to breakdown of skin: Secondary | ICD-10-CM

## 2017-05-29 DIAGNOSIS — L97529 Non-pressure chronic ulcer of other part of left foot with unspecified severity: Secondary | ICD-10-CM | POA: Diagnosis not present

## 2017-05-29 DIAGNOSIS — L97422 Non-pressure chronic ulcer of left heel and midfoot with fat layer exposed: Secondary | ICD-10-CM | POA: Diagnosis not present

## 2017-05-29 NOTE — Patient Instructions (Signed)
Pre-Operative Instructions  Congratulations, you have decided to take an important step towards improving your quality of life.  You can be assured that the doctors and staff at [facility] will be with you every step of the way.  Here are some important things you should know:  1. Plan to be at the surgery center/hospital at least 1 (one) hour prior to your scheduled time, unless otherwise directed by the surgical center/hospital staff.  You must have a responsible adult accompany you, remain during the surgery and drive you home.  Make sure you have directions to the surgical center/hospital to ensure you arrive on time. 2. If you are having surgery at [facility] or [facility], you will need a copy of your medical history and physical form from your family physician within one month prior to the date of surgery. We will give you a form for your primary physician to complete.  3. We make every effort to accommodate the date you request for surgery.  However, there are times where surgery dates or times have to be moved.  We will contact you as soon as possible if a change in schedule is required.   4. No aspirin/ibuprofen for one week before surgery.  If you are on aspirin, any non-steroidal anti-inflammatory medications (Mobic, Aleve, Ibuprofen) should not be taken seven (7) days prior to your surgery.  You make take Tylenol for pain prior to surgery.  5. Medications - If you are taking daily heart and blood pressure medications, seizure, reflux, allergy, asthma, anxiety, pain or diabetes medications, make sure you notify the surgery center/hospital before the day of surgery so they can tell you which medications you should take or avoid the day of surgery. 6. No food or drink after midnight the night before surgery unless directed otherwise by surgical center/hospital staff. 7. No alcoholic beverages 24-hours prior to surgery.  No smoking 24-hours prior or 24-hours after  surgery. 8. Wear loose pants or shorts. They should be loose enough to fit over bandages, boots, and casts. 9. Don't wear slip-on shoes. Sneakers are preferred. 10. Bring your boot with you to the surgery center/hospital.  Also bring crutches or a walker if your physician has prescribed it for you.  If you do not have this equipment, it will be provided for you after surgery. 11. If you have not been contacted by the surgery center/hospital by the day before your surgery, call to confirm the date and time of your surgery. 12. Leave-time from work may vary depending on the type of surgery you have.  Appropriate arrangements should be made prior to surgery with your employer. 13. Prescriptions will be provided immediately following surgery by your doctor.  Fill these as soon as possible after surgery and take the medication as directed. Pain medications will not be refilled on weekends and must be approved by the doctor. 14. Remove nail polish on the operative foot and avoid getting pedicures prior to surgery. 15. Wash the night before surgery.  The night before surgery wash the foot and leg well with water and the antibacterial soap provided. Be sure to pay special attention to beneath the toenails and in between the toes.  Wash for at least three (3) minutes. Rinse thoroughly with water and dry well with a towel.  Perform this wash unless told not to do so by your physician.  Enclosed: 1 Ice pack (please put in freezer the night before surgery)   1 Hibiclens skin cleaner     Pre-op instructions  If you have any questions regarding the instructions, please do not hesitate to call our office.  [place]: [address], [place], [place] [address] -- [phone]  [place]: [address] [place], [place] [address] -- [phone]  [place]: [address]  [place], [place] [address] -- [phone]  [place]: [address], [address], [place], [place] [address] -- [phone]  Website:  [email or web address] 

## 2017-05-29 NOTE — Progress Notes (Signed)
   Subjective:    Patient ID: Brian Caldwell, male    DOB: 1975/09/13, 42 y.o.   MRN: [id number]  HPI    Review of Systems  All other systems reviewed and are negative.      Objective:   Physical Exam        Assessment & Plan:
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
## 2017-06-02 ENCOUNTER — Encounter (HOSPITAL_BASED_OUTPATIENT_CLINIC_OR_DEPARTMENT_OTHER): Payer: Medicaid Other

## 2017-06-08 ENCOUNTER — Ambulatory Visit: Payer: Medicaid Other | Admitting: Student
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
## 2017-06-09 ENCOUNTER — Ambulatory Visit (INDEPENDENT_AMBULATORY_CARE_PROVIDER_SITE_OTHER): Payer: Medicaid Other | Admitting: Student

## 2017-06-09 ENCOUNTER — Encounter: Payer: Self-pay | Admitting: Student

## 2017-06-09 VITALS — BP 112/74 | HR 90 | Ht 72.0 in | Wt 263.6 lb

## 2017-06-09 DIAGNOSIS — Z720 Tobacco use: Secondary | ICD-10-CM

## 2017-06-09 DIAGNOSIS — E785 Hyperlipidemia, unspecified: Secondary | ICD-10-CM

## 2017-06-09 DIAGNOSIS — I2510 Atherosclerotic heart disease of native coronary artery without angina pectoris: Secondary | ICD-10-CM

## 2017-06-09 DIAGNOSIS — Z8679 Personal history of other diseases of the circulatory system: Secondary | ICD-10-CM

## 2017-06-09 DIAGNOSIS — I10 Essential (primary) hypertension: Secondary | ICD-10-CM | POA: Diagnosis not present

## 2017-06-09 NOTE — Patient Instructions (Signed)
Your physician wants you to follow-up in: 6 months with Dr Lurena Joiner will receive a reminder letter in the mail two months in advance. If you don't receive a letter, please call our office to schedule the follow-up appointment.   Please get FASTING Lab work : CMET,Lipids    Your physician recommends that you continue on your current medications as directed. Please refer to the Current Medication list given to you today.     If you need a refill on your cardiac medications before your next appointment, please call your pharmacy.       Thank you for choosing [facility] !

## 2017-06-21 ENCOUNTER — Telehealth: Payer: Self-pay | Admitting: *Deleted

## 2017-06-21 NOTE — Telephone Encounter (Signed)
"  I have this gentleman on the other line.  He said he's supposed to be scheduled for surgery tomorrow."  I do not have him scheduled.  I will give him a call.    I called Brian Caldwell and informed him we didn't have him scheduled for a surgery date.   I offered him  February 28.  He accepted that date

## 2017-06-29 DIAGNOSIS — S91112D Laceration without foreign body of left great toe without damage to nail, subsequent encounter: Secondary | ICD-10-CM | POA: Diagnosis not present

## 2017-06-29 DIAGNOSIS — M216X2 Other acquired deformities of left foot: Secondary | ICD-10-CM | POA: Diagnosis not present

## 2017-06-30 ENCOUNTER — Telehealth: Payer: Self-pay | Admitting: *Deleted

## 2017-06-30 NOTE — Telephone Encounter (Signed)
I spoke with pt and told him he could take ibuprofen as the OTC package instructed between his doses of pain medication, and that it was difficult to get the liner back in the frame and the surgical foot need the protection of the boot. I asked pt to describe the pain and he said constant. I told pt the ace may be on too tight, that it was put on snug after surgery to staunch any post op bleeding and decrease swelling, and sometimes after it had done its job it was too tight. I told pt to be seated and place foot on the bed in front of him, remove the boot, the open-ended sock and the ace wrap only not to touch the gauze, then elevate the foot for 15 minutes, but if the pain worsened then dangle the foot 15 minutes, after 15 minutes put foot level with hip and rewrap the ace starting at the toes and going up the foot, then replace the sock and boot. Pt asked what to do if that did not help, I told him to call the doctor on call at our main number.

## 2017-06-30 NOTE — Telephone Encounter (Signed)
Pt states he has some questions the surgical center could not answer, 1. Can he take ibuprofen, 2.  Can he sleep in just the liner of the boot, 3.  His pain started about 1am or 2am and he is having to take the pain medication every 4 hours.

## 2017-07-03 ENCOUNTER — Telehealth: Payer: Self-pay | Admitting: *Deleted

## 2017-07-03 MED ORDER — HYDROMORPHONE HCL 4 MG PO TABS: 4.0000 mg | ORAL_TABLET | ORAL | 0 refills | Status: DC | PRN

## 2017-07-03 NOTE — Telephone Encounter (Signed)
Pt states he is in severe pain in pain medication only last about 45 - 60 minutes, pain is in the heel, not the calf. I spoke with Dr. Logan Bores and he stated he will change to Dilaudid 4mg  #30 one tablet every 4 hours and can continue the ibuprofen. I informed pt.

## 2017-07-05 ENCOUNTER — Ambulatory Visit (INDEPENDENT_AMBULATORY_CARE_PROVIDER_SITE_OTHER): Payer: Medicaid Other | Admitting: Podiatry

## 2017-07-05 ENCOUNTER — Encounter: Payer: Self-pay | Admitting: Podiatry

## 2017-07-05 VITALS — BP 142/93 | HR 87 | Temp 98.2°F

## 2017-07-05 DIAGNOSIS — Z9889 Other specified postprocedural states: Secondary | ICD-10-CM

## 2017-07-06 ENCOUNTER — Telehealth: Payer: Self-pay | Admitting: *Deleted

## 2017-07-06 MED ORDER — HYDROMORPHONE HCL 4 MG PO TABS: 4.0000 mg | ORAL_TABLET | ORAL | 0 refills | Status: DC | PRN

## 2017-07-06 NOTE — Telephone Encounter (Signed)
I informed pt Dr. Logan Bores had refill the dilaudid as before and it could be picked up in the [place] office.

## 2017-07-06 NOTE — Telephone Encounter (Signed)
Pt request refill of the dilaudid.

## 2017-07-07 NOTE — Progress Notes (Signed)
Excisional debridement left heel. Incision and drainage of left heel.
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
## 2017-07-12 ENCOUNTER — Other Ambulatory Visit: Payer: Self-pay

## 2017-07-12 ENCOUNTER — Ambulatory Visit (INDEPENDENT_AMBULATORY_CARE_PROVIDER_SITE_OTHER): Payer: Medicaid Other | Admitting: Podiatry

## 2017-07-12 ENCOUNTER — Encounter: Payer: Self-pay | Admitting: Podiatry

## 2017-07-12 DIAGNOSIS — L97422 Non-pressure chronic ulcer of left heel and midfoot with fat layer exposed: Secondary | ICD-10-CM

## 2017-07-12 DIAGNOSIS — I83025 Varicose veins of left lower extremity with ulcer other part of foot: Secondary | ICD-10-CM

## 2017-07-12 DIAGNOSIS — L97529 Non-pressure chronic ulcer of other part of left foot with unspecified severity: Secondary | ICD-10-CM

## 2017-07-12 DIAGNOSIS — Z9889 Other specified postprocedural states: Secondary | ICD-10-CM

## 2017-07-12 MED ORDER — HYDROMORPHONE HCL 4 MG PO TABS: 4.0000 mg | ORAL_TABLET | ORAL | 0 refills | Status: DC | PRN
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
## 2017-07-17 ENCOUNTER — Telehealth: Payer: Self-pay | Admitting: Podiatry

## 2017-07-17 MED ORDER — HYDROMORPHONE HCL 4 MG PO TABS: 4.0000 mg | ORAL_TABLET | ORAL | 0 refills | Status: DC | PRN

## 2017-07-17 NOTE — Telephone Encounter (Signed)
I ran out of my pain medication on Saturday. I have really been having a hard time getting any sleep and it's excruciating pain. If y'all could give me a call back with some kind of idea of what to do. You can call me back at [phone]. Thank you and have a nice day.

## 2017-07-17 NOTE — Telephone Encounter (Signed)
Pt states the pain is more around the ankle now, denies increase swelling, redness, drainage, no swelling, heat or hardness to the calf. I told pt he would need to pick up the dilaudid rx in the [place] office.

## 2017-07-17 NOTE — Addendum Note (Signed)
Addended by: Alphia Kava D on: 07/17/2017 11:59 AM   Modules accepted: Orders

## 2017-07-19 ENCOUNTER — Ambulatory Visit (INDEPENDENT_AMBULATORY_CARE_PROVIDER_SITE_OTHER): Payer: Self-pay

## 2017-07-19 DIAGNOSIS — Z9889 Other specified postprocedural states: Secondary | ICD-10-CM

## 2017-07-19 DIAGNOSIS — L97422 Non-pressure chronic ulcer of left heel and midfoot with fat layer exposed: Secondary | ICD-10-CM

## 2017-07-19 MED ORDER — OXYCODONE-ACETAMINOPHEN 10-325 MG PO TABS: 1.0000 | ORAL_TABLET | ORAL | 0 refills | Status: DC | PRN

## 2017-07-24 ENCOUNTER — Telehealth: Payer: Self-pay | Admitting: Podiatry

## 2017-07-24 MED ORDER — OXYCODONE-ACETAMINOPHEN 10-325 MG PO TABS: 1.0000 | ORAL_TABLET | Freq: Four times a day (QID) | ORAL | 0 refills | Status: DC | PRN

## 2017-07-24 NOTE — Telephone Encounter (Signed)
I informed pt of the new pain medication prescription Dr. Logan Bores had prescribed and he could pick it up in the [place] office today.

## 2017-07-24 NOTE — Telephone Encounter (Signed)
I am out of my pain medication and I need a refill. If I could get the refill of the last pain medication I got. Please call me back as soon as you can at [phone]. Thank you.

## 2017-07-24 NOTE — Progress Notes (Signed)
Patient presents s/p left foot surgery/heel ulcer, excisional debridement left heel on 2.28.19. He states that he is still having quite a bit of pain and swelling in his heel and ankle.    Removed soiled bandage, noted draining surgical wound, no foul odor, no redness or any other s/s of infection. All sutures remain intact. Redressed area with betadine and sterile gauze. Re-filled pain Rx Oxycodone 10/325mg , advised to rest and elevate. Follow up in 1 week to see Dr Logan Bores

## 2017-07-24 NOTE — Addendum Note (Signed)
Addended by: Alphia Kava D on: 07/24/2017 10:53 AM   Modules accepted: Orders

## 2017-07-26 ENCOUNTER — Other Ambulatory Visit: Payer: Self-pay

## 2017-07-26 ENCOUNTER — Ambulatory Visit (INDEPENDENT_AMBULATORY_CARE_PROVIDER_SITE_OTHER): Payer: Medicaid Other | Admitting: Podiatry

## 2017-07-26 ENCOUNTER — Encounter: Payer: Self-pay | Admitting: Podiatry

## 2017-07-26 DIAGNOSIS — Z9889 Other specified postprocedural states: Secondary | ICD-10-CM

## 2017-07-26 DIAGNOSIS — L97422 Non-pressure chronic ulcer of left heel and midfoot with fat layer exposed: Secondary | ICD-10-CM

## 2017-07-26 MED ORDER — OXYCODONE-ACETAMINOPHEN 10-325 MG PO TABS: 1.0000 | ORAL_TABLET | Freq: Four times a day (QID) | ORAL | 0 refills | Status: DC | PRN

## 2017-07-26 MED ORDER — GENTAMICIN SULFATE 0.1 % EX CREA: 1.0000 "application " | TOPICAL_CREAM | Freq: Three times a day (TID) | CUTANEOUS | 1 refills | Status: DC

## 2017-07-29 LAB — WOUND CULTURE
MICRO NUMBER: 90382553
SPECIMEN QUALITY:: ADEQUATE

## 2017-08-02 ENCOUNTER — Telehealth: Payer: Self-pay | Admitting: Podiatry

## 2017-08-02 MED ORDER — OXYCODONE-ACETAMINOPHEN 10-325 MG PO TABS: 1.0000 | ORAL_TABLET | Freq: Four times a day (QID) | ORAL | 0 refills | Status: DC | PRN

## 2017-08-02 NOTE — Telephone Encounter (Signed)
I need a refill of my medication. I will run out either tonight or tomorrow. I use [facility].

## 2017-08-02 NOTE — Telephone Encounter (Signed)
Dr. Logan Bores ordered refill as 07/26/2017 percocet 10/325mg . I informed pt the percocet could be picked up in the [address] office before 5:00pm
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
## 2017-08-02 NOTE — Addendum Note (Signed)
Addended by: Alphia Kava D on: 08/02/2017 03:31 PM   Modules accepted: Orders

## 2017-08-07 ENCOUNTER — Telehealth: Payer: Self-pay | Admitting: *Deleted

## 2017-08-07 ENCOUNTER — Telehealth: Payer: Self-pay | Admitting: Podiatry

## 2017-08-07 MED ORDER — OXYCODONE-ACETAMINOPHEN 5-325 MG PO TABS: 1.0000 | ORAL_TABLET | Freq: Four times a day (QID) | ORAL | 0 refills | Status: DC | PRN

## 2017-08-07 NOTE — Telephone Encounter (Signed)
I was calling to get a refill of my pain medication, the oxycodone.

## 2017-08-07 NOTE — Addendum Note (Signed)
Addended by: Alphia Kava D on: 08/07/2017 09:24 AM   Modules accepted: Orders

## 2017-08-07 NOTE — Telephone Encounter (Addendum)
Entered in error

## 2017-08-07 NOTE — Telephone Encounter (Signed)
Dr. Logan Bores states he will begin to taper pt's pain medication strength to Percocet 5/325mg  #28 one tablet every 6 hours prn foot pain.

## 2017-08-07 NOTE — Telephone Encounter (Signed)
I checked pill count of percocet and pt should have enough to get to 08/09/2017. I spoke with pt and he states [facility] is only filling for #28. I told pt I would check with Dr. Logan Bores and see if he would prescribe the Percocet 10/325 again or if a change of medication was needed.

## 2017-08-08 ENCOUNTER — Other Ambulatory Visit: Payer: Self-pay | Admitting: Adult Health

## 2017-08-09 ENCOUNTER — Encounter: Payer: Self-pay | Admitting: Podiatry

## 2017-08-09 ENCOUNTER — Ambulatory Visit (INDEPENDENT_AMBULATORY_CARE_PROVIDER_SITE_OTHER): Payer: Medicaid Other | Admitting: Podiatry

## 2017-08-09 ENCOUNTER — Telehealth: Payer: Self-pay | Admitting: Podiatry

## 2017-08-09 DIAGNOSIS — L97529 Non-pressure chronic ulcer of other part of left foot with unspecified severity: Secondary | ICD-10-CM | POA: Diagnosis not present

## 2017-08-09 DIAGNOSIS — I83025 Varicose veins of left lower extremity with ulcer other part of foot: Secondary | ICD-10-CM

## 2017-08-09 DIAGNOSIS — L97422 Non-pressure chronic ulcer of left heel and midfoot with fat layer exposed: Secondary | ICD-10-CM | POA: Diagnosis not present

## 2017-08-09 MED ORDER — OXYCODONE-ACETAMINOPHEN 10-325 MG PO TABS: 1.0000 | ORAL_TABLET | Freq: Four times a day (QID) | ORAL | 0 refills | Status: DC | PRN

## 2017-08-09 NOTE — Telephone Encounter (Signed)
Patient went to Pharmacy to pick up medication and they said only one RX was sent, and it needs Prior authorization. I told Val, not sure if she is working on it. Val told me to tell patient that hes welcome to pay for the Percocet until she can do the authorization.

## 2017-08-11 ENCOUNTER — Telehealth: Payer: Self-pay | Admitting: Podiatry

## 2017-08-11 NOTE — Telephone Encounter (Signed)
I'm calling in regards to a prescription that I picked up Wednesday. It was called in to the pharmacy and when I went to pick it up they told me it requires a prior authorization. I was just wondering what I can do to help out with this situation. Please give me a call back at [phone]. Thank you and I hope you have a great day.

## 2017-08-11 NOTE — Telephone Encounter (Signed)
I told pt, I had not received a request for pre-cert for the oxycodone, but if he needed the medication he could self-pay for it. Pt stated understanding. Pt asked if he was to use Santyl or continue the gentamicin ointment. I told pt I did not have clinicals to instruct change of wound care to [facility]. I told pt to continue gentamicin ointment and I would send message to Dr. Logan Bores requesting wound care orders.
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
## 2017-08-16 ENCOUNTER — Telehealth: Payer: Self-pay | Admitting: *Deleted

## 2017-08-16 ENCOUNTER — Telehealth: Payer: Self-pay | Admitting: Podiatry

## 2017-08-16 NOTE — Telephone Encounter (Signed)
I'm calling to get a refill on my medication. You can call me back at [phone]. Thank you.

## 2017-08-16 NOTE — Telephone Encounter (Signed)
I informed [facility] pt had surgery 06/29/2017 and the primary dx we were treating him for was L97.422 Ulcer of left heel and midfoot with fat layer exposed.

## 2017-08-16 NOTE — Telephone Encounter (Signed)
[facility] requested the ICD.10 code for the culture collected 06/29/2017, which they sent to [facility].

## 2017-08-17 ENCOUNTER — Telehealth: Payer: Self-pay | Admitting: Podiatry

## 2017-08-17 MED ORDER — OXYCODONE-ACETAMINOPHEN 10-325 MG PO TABS: 1.0000 | ORAL_TABLET | Freq: Four times a day (QID) | ORAL | 0 refills | Status: DC | PRN

## 2017-08-17 NOTE — Telephone Encounter (Signed)
Dr. Logan Bores ordered refill as previously for #28. Orders called to pt.

## 2017-08-17 NOTE — Addendum Note (Signed)
Addended by: Alphia Kava D on: 08/17/2017 09:06 AM   Modules accepted: Orders

## 2017-08-17 NOTE — Telephone Encounter (Signed)
Patient called in and stated that he is running out of Percocet and needs a refill. Please give him a call.

## 2017-08-21 ENCOUNTER — Telehealth: Payer: Self-pay | Admitting: *Deleted

## 2017-08-21 ENCOUNTER — Ambulatory Visit (INDEPENDENT_AMBULATORY_CARE_PROVIDER_SITE_OTHER): Payer: Medicaid Other | Admitting: Podiatry

## 2017-08-21 ENCOUNTER — Encounter: Payer: Self-pay | Admitting: Podiatry

## 2017-08-21 DIAGNOSIS — L97529 Non-pressure chronic ulcer of other part of left foot with unspecified severity: Principal | ICD-10-CM

## 2017-08-21 DIAGNOSIS — I83025 Varicose veins of left lower extremity with ulcer other part of foot: Secondary | ICD-10-CM | POA: Diagnosis not present

## 2017-08-21 DIAGNOSIS — L97422 Non-pressure chronic ulcer of left heel and midfoot with fat layer exposed: Secondary | ICD-10-CM

## 2017-08-21 MED ORDER — OXYCODONE-ACETAMINOPHEN 10-325 MG PO TABS: 1.0000 | ORAL_TABLET | Freq: Three times a day (TID) | ORAL | 0 refills | Status: DC | PRN

## 2017-08-21 NOTE — Telephone Encounter (Signed)
Pt received percocet on 08/17/2017.
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
## 2017-08-21 NOTE — Telephone Encounter (Deleted)
-----   Message from Brent M Evans, DPM sent at 08/21/2017 10:54 AM EDT ----- Regarding: Wound VAC Can we please get this patient approved for a wound VAC to the right posterior heel, with dressing changes twice per week.   Thanks, Dr. Evans  Note is dictated for today's office visit 

## 2017-08-21 NOTE — Telephone Encounter (Signed)
-----   Message from Felecia Shelling, DPM sent at 08/18/2017  8:26 AM EDT ----- Regarding: Pain medication Just continue Percocet 10/325mg  until next office visit. Next office visit I will probably talk to him about tapering down.   Dr. Logan Bores

## 2017-08-22 NOTE — Telephone Encounter (Signed)
Faxed required form, clinicals and demographics to [facility].

## 2017-08-22 NOTE — Telephone Encounter (Signed)
Faxed required form, clinicals and demographics to [facility]. 

## 2017-08-22 NOTE — Telephone Encounter (Signed)
-----   Message from Felecia Shelling, DPM sent at 08/21/2017 10:54 AM EDT ----- Regarding: Wound VAC Can we please get this patient approved for a wound VAC to the right posterior heel, with dressing changes twice per week.   Thanks, Dr. Logan Bores  Note is dictated for today's office visit

## 2017-08-22 NOTE — Telephone Encounter (Signed)
Pt states he is not established with home health care agency. I explained I would refer to an agency for wound vac care and they would contact him to establish as pt.

## 2017-08-23 ENCOUNTER — Ambulatory Visit: Payer: Medicaid Other | Admitting: Podiatry

## 2017-08-28 ENCOUNTER — Telehealth: Payer: Self-pay | Admitting: Podiatry

## 2017-08-28 MED ORDER — OXYCODONE-ACETAMINOPHEN 10-325 MG PO TABS: 1.0000 | ORAL_TABLET | Freq: Three times a day (TID) | ORAL | 0 refills | Status: DC | PRN

## 2017-08-28 NOTE — Addendum Note (Signed)
Addended by: Alphia Kava D on: 08/28/2017 11:28 AM   Modules accepted: Orders

## 2017-08-28 NOTE — Telephone Encounter (Signed)
I told pt Dr. Logan Bores had refilled the Percocet and he would be taking it with the same directions as previously, there would be a change of the amount dispensed #21, and he could pick up the rx in the [facility] office.

## 2017-08-28 NOTE — Telephone Encounter (Signed)
I'm calling to get a refill of my medication. You can reach me at [phone]. Thank you.

## 2017-09-04 ENCOUNTER — Ambulatory Visit (INDEPENDENT_AMBULATORY_CARE_PROVIDER_SITE_OTHER): Payer: Medicaid Other | Admitting: Podiatry

## 2017-09-04 ENCOUNTER — Encounter: Payer: Self-pay | Admitting: Podiatry

## 2017-09-04 DIAGNOSIS — L97529 Non-pressure chronic ulcer of other part of left foot with unspecified severity: Principal | ICD-10-CM

## 2017-09-04 DIAGNOSIS — I83025 Varicose veins of left lower extremity with ulcer other part of foot: Secondary | ICD-10-CM | POA: Diagnosis not present

## 2017-09-04 DIAGNOSIS — L97422 Non-pressure chronic ulcer of left heel and midfoot with fat layer exposed: Secondary | ICD-10-CM

## 2017-09-04 MED ORDER — OXYCODONE-ACETAMINOPHEN 10-325 MG PO TABS: 1.0000 | ORAL_TABLET | Freq: Three times a day (TID) | ORAL | 0 refills | Status: DC | PRN
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
## 2017-09-11 ENCOUNTER — Telehealth: Payer: Self-pay | Admitting: Podiatry

## 2017-09-11 NOTE — Telephone Encounter (Signed)
I'm calling to get a refill on my pain medication, the oxycodone 10-325. Please give me a call back at [phone]. Thank you.

## 2017-09-11 NOTE — Telephone Encounter (Signed)
I reviewed pt's Med & Orders the Percocet 10/325 was ordered on 09/04/2017, pt's request is 3 days early. I informed pt and he stated he thought the prescriptions had been switched to Mondays. I told pt he had received #30 on 09/04/2017 and at 3 times a day he should have had 10 days enough to get him to 09/14/2017.

## 2017-09-14 ENCOUNTER — Telehealth: Payer: Self-pay | Admitting: Podiatry

## 2017-09-14 MED ORDER — OXYCODONE-ACETAMINOPHEN 10-325 MG PO TABS: 1.0000 | ORAL_TABLET | Freq: Three times a day (TID) | ORAL | 0 refills | Status: DC | PRN

## 2017-09-14 NOTE — Telephone Encounter (Signed)
I informed pt he could pick up the percocet rx in the [place] office. Pt states his insurance will only pay for the generic and I told him it would be ordered for the oxycodone with acetaminophen.

## 2017-09-14 NOTE — Addendum Note (Signed)
Addended by: Alphia Kava D on: 09/14/2017 09:05 AM   Modules accepted: Orders

## 2017-09-14 NOTE — Telephone Encounter (Signed)
I'm calling about a refill of my medication. You can reach me at [phone]. Thank you.

## 2017-09-15 ENCOUNTER — Telehealth: Payer: Self-pay | Admitting: *Deleted

## 2017-09-15 NOTE — Telephone Encounter (Signed)
Left message informing Jacklynn Bue, RN of Dr. Logan Bores orders and to instruct pt to go to ED if worsens or signs of infection, and I would call to assist pt in scheduling an appt for next week.

## 2017-09-15 NOTE — Telephone Encounter (Signed)
I spoke with pt and told him I would help him make an appt for next week with Dr. Logan Bores. Pt asked how he was to dress his foot. I asked if they had dressed his foot today, pt states yes. I told him I had sent orders from Dr. Logan Bores to [facility] home [facility].

## 2017-09-15 NOTE — Telephone Encounter (Signed)
I informed Dr. Logan Bores and he states that is pt's chronic wound state, stop the wound vac and perform wet to dry dressings and get in next week.

## 2017-09-15 NOTE — Telephone Encounter (Signed)
Jacklynn Bue, RN - [facility] states pt is complaining of more pain, wound size is small, with slough and large area of deep purple tissue beneath, would like orders to stop the wound vac and apply wet to dry dressing.

## 2017-09-18 ENCOUNTER — Ambulatory Visit: Payer: Medicaid Other | Admitting: Podiatry

## 2017-09-18 ENCOUNTER — Encounter: Payer: Self-pay | Admitting: Podiatry

## 2017-09-18 DIAGNOSIS — L97422 Non-pressure chronic ulcer of left heel and midfoot with fat layer exposed: Secondary | ICD-10-CM

## 2017-09-18 DIAGNOSIS — L97529 Non-pressure chronic ulcer of other part of left foot with unspecified severity: Principal | ICD-10-CM

## 2017-09-18 DIAGNOSIS — I83025 Varicose veins of left lower extremity with ulcer other part of foot: Secondary | ICD-10-CM | POA: Diagnosis not present

## 2017-09-18 DIAGNOSIS — Z9889 Other specified postprocedural states: Secondary | ICD-10-CM

## 2017-09-18 MED ORDER — OXYCODONE-ACETAMINOPHEN 10-325 MG PO TABS: 1.0000 | ORAL_TABLET | Freq: Four times a day (QID) | ORAL | 0 refills | Status: DC | PRN

## 2017-09-18 MED ORDER — COLLAGENASE 250 UNIT/GM EX OINT: 1.0000 "application " | TOPICAL_OINTMENT | Freq: Every day | CUTANEOUS | 2 refills | Status: DC

## 2017-09-19 ENCOUNTER — Telehealth: Payer: Self-pay | Admitting: *Deleted

## 2017-09-19 NOTE — Telephone Encounter (Signed)
I spoke with pt to make certain he was okay with continuing HHC once a week. Pt states he would like to continue the HHC at least once a week. Alvino Chapel, nurse - [facility] was with the pt and she stated he will receive the santyl today and she will see him again later in the week and begin the once a week next week.

## 2017-09-19 NOTE — Telephone Encounter (Signed)
Jacklynn Bue, nurse states pt says Dr. Logan Bores says [facility] is not needed with the new order pt is to apply Santyl daily, but she feels pt needs to be seen at least once a week.
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
## 2017-09-26 ENCOUNTER — Telehealth: Payer: Self-pay | Admitting: Podiatry

## 2017-09-26 NOTE — Telephone Encounter (Signed)
I'm calling to get a refill of the oxycodone 10-325 tab. My call back number is [phone]. Thank you and have a good day.

## 2017-09-27 ENCOUNTER — Ambulatory Visit: Payer: Medicaid Other | Admitting: Podiatry

## 2017-09-27 ENCOUNTER — Telehealth: Payer: Self-pay | Admitting: Podiatry

## 2017-09-27 NOTE — Telephone Encounter (Signed)
I'm calling for a medication refill. I called yesterday but I don't know what is going on. If you could give me a call back and let me know if there is something I can do to help speed this along. I have completely run out. My number is [phone]. Thank you.

## 2017-09-27 NOTE — Telephone Encounter (Signed)
    09/26/17 8:24 AM  Note    I'm calling to get a refill of the oxycodone 10-325 tab. My call back number is [phone]. Thank you and have a good day

## 2017-09-27 NOTE — Telephone Encounter (Signed)
Dr. Logan Bores states pt can get refill. And I informed pt, pt asked that the rx be sent to [facility].

## 2017-09-28 MED ORDER — OXYCODONE-ACETAMINOPHEN 10-325 MG PO TABS: 1.0000 | ORAL_TABLET | Freq: Three times a day (TID) | ORAL | 0 refills | Status: DC | PRN

## 2017-09-28 NOTE — Addendum Note (Signed)
Addended by: Alphia Kava D on: 09/28/2017 08:31 AM   Modules accepted: Orders

## 2017-10-09 ENCOUNTER — Ambulatory Visit: Payer: Medicaid Other | Admitting: Podiatry

## 2017-10-09 DIAGNOSIS — I83025 Varicose veins of left lower extremity with ulcer other part of foot: Secondary | ICD-10-CM

## 2017-10-09 DIAGNOSIS — L97422 Non-pressure chronic ulcer of left heel and midfoot with fat layer exposed: Secondary | ICD-10-CM

## 2017-10-09 DIAGNOSIS — L97529 Non-pressure chronic ulcer of other part of left foot with unspecified severity: Principal | ICD-10-CM

## 2017-10-09 MED ORDER — COLLAGENASE 250 UNIT/GM EX OINT: 1.0000 "application " | TOPICAL_OINTMENT | Freq: Every day | CUTANEOUS | 2 refills | Status: DC

## 2017-10-09 MED ORDER — OXYCODONE-ACETAMINOPHEN 10-325 MG PO TABS: 1.0000 | ORAL_TABLET | Freq: Three times a day (TID) | ORAL | 0 refills | Status: DC | PRN
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
## 2017-10-18 ENCOUNTER — Other Ambulatory Visit: Payer: Self-pay | Admitting: Podiatry

## 2017-10-18 ENCOUNTER — Telehealth: Payer: Self-pay | Admitting: Podiatry

## 2017-10-18 MED ORDER — OXYCODONE-ACETAMINOPHEN 10-325 MG PO TABS: 1.0000 | ORAL_TABLET | Freq: Three times a day (TID) | ORAL | 0 refills | Status: DC | PRN

## 2017-10-18 NOTE — Telephone Encounter (Signed)
Routed pt's medication request to Dr. Logan Bores to be evaluated and sent to pt's pharmacy.

## 2017-10-18 NOTE — Telephone Encounter (Signed)
I'm calling about a refill for my pain medication. You can call me back at [phone]. Thank you. Have a nice day.

## 2017-10-18 NOTE — Telephone Encounter (Signed)
I informed pt Dr. Logan Bores had sent the percocet to his pharmacy.

## 2017-10-20 ENCOUNTER — Telehealth: Payer: Self-pay | Admitting: Podiatry

## 2017-10-20 DIAGNOSIS — I83025 Varicose veins of left lower extremity with ulcer other part of foot: Secondary | ICD-10-CM | POA: Diagnosis not present

## 2017-10-20 DIAGNOSIS — L97422 Non-pressure chronic ulcer of left heel and midfoot with fat layer exposed: Secondary | ICD-10-CM | POA: Diagnosis not present

## 2017-10-20 NOTE — Telephone Encounter (Signed)
This is Alvino Chapel, Charity fundraiser with [facility]. I'm calling to let you know that I re-certified Mr. Vedder to continue his wound care weekly for nine more weeks. If you have any questions, you can call me at [phone].

## 2017-10-24 DIAGNOSIS — I83025 Varicose veins of left lower extremity with ulcer other part of foot: Secondary | ICD-10-CM

## 2017-10-24 DIAGNOSIS — L97422 Non-pressure chronic ulcer of left heel and midfoot with fat layer exposed: Secondary | ICD-10-CM

## 2017-10-30 ENCOUNTER — Ambulatory Visit: Payer: Medicaid Other | Admitting: Podiatry

## 2017-10-30 ENCOUNTER — Encounter: Payer: Self-pay | Admitting: Podiatry

## 2017-10-30 VITALS — BP 153/94 | HR 88 | Temp 98.4°F

## 2017-10-30 DIAGNOSIS — L97422 Non-pressure chronic ulcer of left heel and midfoot with fat layer exposed: Secondary | ICD-10-CM

## 2017-10-30 DIAGNOSIS — I83025 Varicose veins of left lower extremity with ulcer other part of foot: Secondary | ICD-10-CM | POA: Diagnosis not present

## 2017-10-30 DIAGNOSIS — L97529 Non-pressure chronic ulcer of other part of left foot with unspecified severity: Principal | ICD-10-CM

## 2017-10-30 MED ORDER — OXYCODONE-ACETAMINOPHEN 10-325 MG PO TABS: 1.0000 | ORAL_TABLET | Freq: Three times a day (TID) | ORAL | 0 refills | Status: DC | PRN

## 2017-10-31 ENCOUNTER — Telehealth: Payer: Self-pay | Admitting: *Deleted

## 2017-10-31 DIAGNOSIS — L97422 Non-pressure chronic ulcer of left heel and midfoot with fat layer exposed: Secondary | ICD-10-CM

## 2017-10-31 DIAGNOSIS — I83025 Varicose veins of left lower extremity with ulcer other part of foot: Secondary | ICD-10-CM

## 2017-10-31 NOTE — Telephone Encounter (Signed)
-----   Message from Felecia Shelling, DPM sent at 10/30/2017 12:46 PM EDT ----- Regarding: Referral to pain management Please refer to pain management.   Dx: chronic LT post heel pain x years. LT heel ulcer.   Thanks, Dr.Evans

## 2017-10-31 NOTE — Telephone Encounter (Signed)
Faxed required form, clinicals and Demographics to [facility].

## 2017-11-01 ENCOUNTER — Telehealth: Payer: Self-pay | Admitting: Podiatry

## 2017-11-01 NOTE — Telephone Encounter (Signed)
Patient told the home care his foot is feeling better and didn't need their services anymore. The home care is calling to verify. Please call Alvino Chapel back at [phone]

## 2017-11-01 NOTE — Telephone Encounter (Signed)
I asked Ms Brian Caldwell if there was a once a week check service with [facility]. Ms Brian Caldwell stated no, and pt made it clear he did not need there services, and had been reluctant all along to have them at his house. Ms Brian Caldwell states she will make one more visit to discharge.
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
## 2017-11-07 ENCOUNTER — Telehealth: Payer: Self-pay | Admitting: Podiatry

## 2017-11-07 MED ORDER — OXYCODONE-ACETAMINOPHEN 10-325 MG PO TABS: 1.0000 | ORAL_TABLET | Freq: Three times a day (TID) | ORAL | 0 refills | Status: DC | PRN

## 2017-11-07 NOTE — Addendum Note (Signed)
Addended by: Alphia Kava D on: 11/07/2017 03:19 PM   Modules accepted: Orders

## 2017-11-07 NOTE — Telephone Encounter (Addendum)
I informed pt the rx for the percocet could be picked up in the [facility] office and filled on 11/09/2017. I informed pt of [facility] number.

## 2017-11-07 NOTE — Telephone Encounter (Signed)
I'm calling to get a refill of the oxycodone 10-325 tablets. Also, Dr. Logan Bores and myself had talked about a referral for pain clinic and I wanted to check on the status of that. Thank you very much and you all have a wonderful day.

## 2017-11-17 ENCOUNTER — Telehealth: Payer: Self-pay | Admitting: Podiatry

## 2017-11-17 MED ORDER — OXYCODONE-ACETAMINOPHEN 10-325 MG PO TABS: 1.0000 | ORAL_TABLET | Freq: Three times a day (TID) | ORAL | 0 refills | Status: DC | PRN

## 2017-11-17 NOTE — Addendum Note (Signed)
Addended by: Alphia Kava D on: 11/17/2017 09:30 AM   Modules accepted: Orders

## 2017-11-17 NOTE — Telephone Encounter (Signed)
I'm calling because I will be out of my medicine on Sunday and both the doctor's office and pharmacy will be closed. I was wondering if I could get the medicine filled today or tomorrow. You can call me back at [phone]. Thank you very much and I hope you have a nice day.

## 2017-11-17 NOTE — Telephone Encounter (Signed)
I informed pt the Percocet rx could be picked up in the [facility] today before 4:00pm.

## 2017-11-22 ENCOUNTER — Ambulatory Visit: Payer: Medicaid Other | Admitting: Podiatry

## 2017-11-22 DIAGNOSIS — I83025 Varicose veins of left lower extremity with ulcer other part of foot: Secondary | ICD-10-CM | POA: Diagnosis not present

## 2017-11-22 DIAGNOSIS — L97422 Non-pressure chronic ulcer of left heel and midfoot with fat layer exposed: Secondary | ICD-10-CM

## 2017-11-22 MED ORDER — OXYCODONE-ACETAMINOPHEN 10-325 MG PO TABS: 1.0000 | ORAL_TABLET | Freq: Three times a day (TID) | ORAL | 0 refills | Status: DC | PRN
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
## 2017-11-22 NOTE — Addendum Note (Signed)
Addended by: Felecia Shelling on: 11/22/2017 12:23 PM   Modules accepted: Orders

## 2017-12-06 ENCOUNTER — Ambulatory Visit: Payer: Medicaid Other | Admitting: Podiatry

## 2017-12-13 ENCOUNTER — Ambulatory Visit: Payer: Medicaid Other | Admitting: Podiatry

## 2017-12-27 ENCOUNTER — Ambulatory Visit (INDEPENDENT_AMBULATORY_CARE_PROVIDER_SITE_OTHER): Payer: Medicaid Other | Admitting: Podiatry

## 2017-12-27 ENCOUNTER — Encounter: Payer: Self-pay | Admitting: Podiatry

## 2017-12-27 DIAGNOSIS — I83025 Varicose veins of left lower extremity with ulcer other part of foot: Secondary | ICD-10-CM | POA: Diagnosis not present

## 2017-12-27 DIAGNOSIS — L97422 Non-pressure chronic ulcer of left heel and midfoot with fat layer exposed: Secondary | ICD-10-CM
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
## 2018-01-03 ENCOUNTER — Other Ambulatory Visit: Payer: Self-pay | Admitting: Adult Health

## 2018-01-24 ENCOUNTER — Ambulatory Visit: Payer: Medicaid Other | Admitting: Podiatry

## 2018-01-31 DIAGNOSIS — G90522 Complex regional pain syndrome I of left lower limb: Secondary | ICD-10-CM | POA: Insufficient documentation

## 2018-02-07 ENCOUNTER — Ambulatory Visit: Payer: Medicaid Other | Admitting: Podiatry

## 2018-02-19 ENCOUNTER — Ambulatory Visit: Payer: Medicaid Other | Admitting: Podiatry

## 2018-02-19 ENCOUNTER — Encounter: Payer: Self-pay | Admitting: Podiatry

## 2018-02-19 ENCOUNTER — Ambulatory Visit (INDEPENDENT_AMBULATORY_CARE_PROVIDER_SITE_OTHER): Payer: Medicaid Other

## 2018-02-19 ENCOUNTER — Other Ambulatory Visit: Payer: Self-pay | Admitting: Podiatry

## 2018-02-19 DIAGNOSIS — M2041 Other hammer toe(s) (acquired), right foot: Secondary | ICD-10-CM | POA: Diagnosis not present

## 2018-02-19 DIAGNOSIS — L97422 Non-pressure chronic ulcer of left heel and midfoot with fat layer exposed: Secondary | ICD-10-CM

## 2018-02-19 DIAGNOSIS — M79671 Pain in right foot: Secondary | ICD-10-CM

## 2018-02-19 NOTE — Patient Instructions (Signed)
Pre-Operative Instructions  Congratulations, you have decided to take an important step towards improving your quality of life.  You can be assured that the doctors and staff at [facility] will be with you every step of the way.  Here are some important things you should know:  1. Plan to be at the surgery center/hospital at least 1 (one) hour prior to your scheduled time, unless otherwise directed by the surgical center/hospital staff.  You must have a responsible adult accompany you, remain during the surgery and drive you home.  Make sure you have directions to the surgical center/hospital to ensure you arrive on time. 2. If you are having surgery at [facility] or [facility] hospitals, you will need a copy of your medical history and physical form from your family physician within one month prior to the date of surgery. We will give you a form for your primary physician to complete.  3. We make every effort to accommodate the date you request for surgery.  However, there are times where surgery dates or times have to be moved.  We will contact you as soon as possible if a change in schedule is required.   4. No aspirin/ibuprofen for one week before surgery.  If you are on aspirin, any non-steroidal anti-inflammatory medications (Mobic, Aleve, Ibuprofen) should not be taken seven (7) days prior to your surgery.  You make take Tylenol for pain prior to surgery.  5. Medications - If you are taking daily heart and blood pressure medications, seizure, reflux, allergy, asthma, anxiety, pain or diabetes medications, make sure you notify the surgery center/hospital before the day of surgery so they can tell you which medications you should take or avoid the day of surgery. 6. No food or drink after midnight the night before surgery unless directed otherwise by surgical center/hospital staff. 7. No alcoholic beverages 24-hours prior to surgery.  No smoking 24-hours prior or 24-hours after  surgery. 8. Wear loose pants or shorts. They should be loose enough to fit over bandages, boots, and casts. 9. Don't wear slip-on shoes. Sneakers are preferred. 10. Bring your boot with you to the surgery center/hospital.  Also bring crutches or a walker if your physician has prescribed it for you.  If you do not have this equipment, it will be provided for you after surgery. 11. If you have not been contacted by the surgery center/hospital by the day before your surgery, call to confirm the date and time of your surgery. 12. Leave-time from work may vary depending on the type of surgery you have.  Appropriate arrangements should be made prior to surgery with your employer. 13. Prescriptions will be provided immediately following surgery by your doctor.  Fill these as soon as possible after surgery and take the medication as directed. Pain medications will not be refilled on weekends and must be approved by the doctor. 14. Remove nail polish on the operative foot and avoid getting pedicures prior to surgery. 15. Wash the night before surgery.  The night before surgery wash the foot and leg well with water and the antibacterial soap provided. Be sure to pay special attention to beneath the toenails and in between the toes.  Wash for at least three (3) minutes. Rinse thoroughly with water and dry well with a towel.  Perform this wash unless told not to do so by your physician.  Enclosed: 1 Ice pack (please put in freezer the night before surgery)   1 Hibiclens skin cleaner     Pre-op instructions  If you have any questions regarding the instructions, please do not hesitate to call our office.  [place]: [address], , [place] [address] -- [phone]  [place]: [address] [place], [place] [address] -- [phone]  [place]: [address]  [place], [place] [address] -- [phone]  [place]: [address], [address], [place], [place] [address] -- [phone]  Website:  [email or web address] 

## 2018-02-23 ENCOUNTER — Other Ambulatory Visit: Payer: Self-pay | Admitting: Adult Health
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
## 2018-05-15 ENCOUNTER — Telehealth: Payer: Self-pay | Admitting: *Deleted

## 2018-05-15 NOTE — Telephone Encounter (Signed)
"  I'm calling to schedule my surgery."  Dr. Logan Bores does not have anything available until March 5.  "Okay, that date will be fine."  Someone from the surgical center will call you a day or two prior to your surgery date.  They will give you your arrival time.  All you need to do is register with the surgical center, instructions are in the brochure that we gave you.

## 2018-06-14 ENCOUNTER — Other Ambulatory Visit: Payer: Self-pay | Admitting: Cardiology

## 2018-06-14 MED ORDER — ATORVASTATIN CALCIUM 80 MG PO TABS: ORAL_TABLET | ORAL | 0 refills | Status: DC

## 2018-06-14 MED ORDER — LISINOPRIL 5 MG PO TABS: ORAL_TABLET | ORAL | 0 refills | Status: DC

## 2018-06-14 MED ORDER — CARVEDILOL 6.25 MG PO TABS: ORAL_TABLET | ORAL | 6 refills | Status: DC

## 2018-06-14 NOTE — Telephone Encounter (Signed)
Done

## 2018-06-14 NOTE — Telephone Encounter (Signed)
Needing refills on:  atorvastatin (LIPITOR) 80 MG tablet [[id number]]   carvedilol (COREG) 6.25 MG tablet [[id number]]   lisinopril (PRINIVIL,ZESTRIL) 5 MG tablet [[id number]]   Sent to his pharmacy, he's scheduled and apt w/ JB on 08/09/2018

## 2018-07-05 DIAGNOSIS — M7751 Other enthesopathy of right foot: Secondary | ICD-10-CM

## 2018-07-05 DIAGNOSIS — M2041 Other hammer toe(s) (acquired), right foot: Secondary | ICD-10-CM | POA: Diagnosis not present

## 2018-07-09 ENCOUNTER — Telehealth: Payer: Self-pay

## 2018-07-09 ENCOUNTER — Encounter: Payer: Self-pay | Admitting: Podiatry

## 2018-07-09 NOTE — Telephone Encounter (Signed)
Called pt post surgery; "Doing as well as to be expected, little painful but taking percocet that helps a little; staying off my foot. " I asked patient if bandages were still in tact, he says "yes and still wearing boot". Informed pt the importance of elevation and to continue pain medication as needed.

## 2018-07-16 ENCOUNTER — Other Ambulatory Visit: Payer: Self-pay

## 2018-07-16 ENCOUNTER — Ambulatory Visit (INDEPENDENT_AMBULATORY_CARE_PROVIDER_SITE_OTHER): Payer: Medicaid Other | Admitting: Podiatry

## 2018-07-16 ENCOUNTER — Ambulatory Visit (INDEPENDENT_AMBULATORY_CARE_PROVIDER_SITE_OTHER): Payer: Medicaid Other

## 2018-07-16 VITALS — Temp 98.9°F

## 2018-07-16 DIAGNOSIS — M2041 Other hammer toe(s) (acquired), right foot: Secondary | ICD-10-CM | POA: Diagnosis not present

## 2018-07-16 DIAGNOSIS — Z9889 Other specified postprocedural states: Secondary | ICD-10-CM
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
## 2018-07-23 ENCOUNTER — Ambulatory Visit (INDEPENDENT_AMBULATORY_CARE_PROVIDER_SITE_OTHER): Payer: Medicaid Other | Admitting: Podiatry

## 2018-07-23 ENCOUNTER — Other Ambulatory Visit: Payer: Self-pay

## 2018-07-23 ENCOUNTER — Ambulatory Visit (INDEPENDENT_AMBULATORY_CARE_PROVIDER_SITE_OTHER): Payer: Medicaid Other

## 2018-07-23 ENCOUNTER — Encounter: Payer: Self-pay | Admitting: Podiatry

## 2018-07-23 DIAGNOSIS — Z9889 Other specified postprocedural states: Secondary | ICD-10-CM

## 2018-07-23 DIAGNOSIS — M2041 Other hammer toe(s) (acquired), right foot: Secondary | ICD-10-CM | POA: Diagnosis not present
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
## 2018-08-02 ENCOUNTER — Other Ambulatory Visit: Payer: Self-pay

## 2018-08-02 MED ORDER — LISINOPRIL 5 MG PO TABS: ORAL_TABLET | ORAL | 0 refills | Status: DC

## 2018-08-02 MED ORDER — ATORVASTATIN CALCIUM 80 MG PO TABS: ORAL_TABLET | ORAL | 0 refills | Status: DC

## 2018-08-02 MED ORDER — CARVEDILOL 6.25 MG PO TABS: ORAL_TABLET | ORAL | 6 refills | Status: DC

## 2018-08-06 ENCOUNTER — Encounter: Payer: Self-pay | Admitting: Podiatry

## 2018-08-06 ENCOUNTER — Other Ambulatory Visit: Payer: Self-pay

## 2018-08-06 ENCOUNTER — Ambulatory Visit (INDEPENDENT_AMBULATORY_CARE_PROVIDER_SITE_OTHER): Payer: Medicaid Other

## 2018-08-06 ENCOUNTER — Ambulatory Visit (INDEPENDENT_AMBULATORY_CARE_PROVIDER_SITE_OTHER): Payer: Medicaid Other | Admitting: Podiatry

## 2018-08-06 VITALS — Temp 95.2°F

## 2018-08-06 DIAGNOSIS — M2041 Other hammer toe(s) (acquired), right foot: Secondary | ICD-10-CM

## 2018-08-06 DIAGNOSIS — Z9889 Other specified postprocedural states: Secondary | ICD-10-CM
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
## 2018-08-08 ENCOUNTER — Telehealth: Payer: Self-pay | Admitting: *Deleted

## 2018-08-08 NOTE — Telephone Encounter (Signed)
Pt verbalized consent for telehealth appt tomorrow with Dr Wyline Mood. Reviewed pt meds/pharmacy/allergies. Pt has BP cuff and will have BP/HR/weight available tomorrow.

## 2018-08-09 ENCOUNTER — Telehealth: Payer: Medicaid Other | Admitting: Cardiology

## 2018-08-09 ENCOUNTER — Encounter: Payer: Self-pay | Admitting: Cardiology

## 2018-08-09 NOTE — Progress Notes (Signed)
Contacted patient by phone, very poor connection with inabilty to hear him. He reports he has not other phone. We will arrange a regular clinic appt for him in 2 months.   Joanie Coddington, MD  08/09/2018 9:12 AM    [facility]

## 2018-08-20 ENCOUNTER — Encounter: Payer: Medicaid Other | Admitting: Podiatry

## 2018-09-05 ENCOUNTER — Ambulatory Visit (INDEPENDENT_AMBULATORY_CARE_PROVIDER_SITE_OTHER): Payer: Medicaid Other

## 2018-09-05 ENCOUNTER — Encounter: Payer: Self-pay | Admitting: Podiatry

## 2018-09-05 ENCOUNTER — Other Ambulatory Visit: Payer: Self-pay

## 2018-09-05 ENCOUNTER — Ambulatory Visit (INDEPENDENT_AMBULATORY_CARE_PROVIDER_SITE_OTHER): Payer: Medicaid Other | Admitting: Podiatry

## 2018-09-05 VITALS — Temp 95.0°F

## 2018-09-05 DIAGNOSIS — M2041 Other hammer toe(s) (acquired), right foot: Secondary | ICD-10-CM | POA: Diagnosis not present

## 2018-09-05 DIAGNOSIS — Z9889 Other specified postprocedural states: Secondary | ICD-10-CM
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
## 2018-09-07 ENCOUNTER — Telehealth: Payer: Self-pay | Admitting: *Deleted

## 2018-09-07 NOTE — Telephone Encounter (Signed)
"  I can do my surgery everyday except for May 20.  Please give me a call to reschedule."  I am returning your call.  You want to reschedule your surgery date?  "Yes, I was wondering if we can do it after June 2nd."  Yes, Dr. Logan Bores can do it on October 03, 2018.  "Okay, June 3 is fine."  I'll get it scheduled.  Be here at 7:45 am that morning.  "I will."

## 2018-09-27 ENCOUNTER — Telehealth: Payer: Self-pay | Admitting: Cardiology

## 2018-09-27 MED ORDER — CARVEDILOL 6.25 MG PO TABS: ORAL_TABLET | ORAL | 0 refills | Status: DC

## 2018-09-27 MED ORDER — LISINOPRIL 5 MG PO TABS: ORAL_TABLET | ORAL | 0 refills | Status: DC

## 2018-09-27 MED ORDER — ATORVASTATIN CALCIUM 80 MG PO TABS: ORAL_TABLET | ORAL | 0 refills | Status: DC

## 2018-09-27 NOTE — Telephone Encounter (Signed)
lisinopril (PRINIVIL,ZESTRIL) 5 MG tablet [[id number]]   atorvastatin (LIPITOR) 80 MG tablet [[id number]]   carvedilol (COREG) 6.25 MG tablet [[id number]]    Pt is needing refills on these sent to [facility] in [place]

## 2018-09-27 NOTE — Telephone Encounter (Signed)
Refill complete 

## 2018-10-03 ENCOUNTER — Other Ambulatory Visit: Payer: Self-pay

## 2018-10-03 ENCOUNTER — Ambulatory Visit: Payer: Medicaid Other | Admitting: Podiatry

## 2018-10-08 NOTE — Progress Notes (Signed)
This encounter was created in error - please disregard.  This encounter was created in error - please disregard.

## 2018-10-30 ENCOUNTER — Telehealth: Payer: Self-pay | Admitting: *Deleted

## 2018-10-30 ENCOUNTER — Other Ambulatory Visit: Payer: Self-pay | Admitting: Cardiology

## 2018-10-30 ENCOUNTER — Ambulatory Visit: Payer: Medicaid Other | Admitting: Cardiology

## 2018-10-30 NOTE — Telephone Encounter (Signed)
"  I was supposed to call to set up a surgery with Dr. Amalia Hailey on my right foot, the pinkie toe.  Originally he wanted to do it in the office.  We discussed doing it in a surgical center.  I was calling to set up a date for that hopefully.  Give me a call back."

## 2018-11-01 NOTE — Telephone Encounter (Signed)
I'm returning your call.  Dr. Amalia Hailey' next available date for surgery is not until August.  Do you have a date that you would like?  "I'd like to do it the first part of August."  Dr. Amalia Hailey can do it on August 6.  "That date will be fine."  Did you get another surgical kit and a brochure for the surgical center when you were here?  "I did not."  You need to come by the office to pick up another kit, the brochure for the surgical center will also be in the bag.  You need to register with the surgical center via their [facility] [employer] [facility].  The instructions on how to do that are in the brochure.  Someone from the surgical center will call you a day or two prior to your surgery date and will give you your arrival time.

## 2018-12-06 DIAGNOSIS — M2041 Other hammer toe(s) (acquired), right foot: Secondary | ICD-10-CM

## 2018-12-10 ENCOUNTER — Telehealth: Payer: Self-pay

## 2018-12-10 ENCOUNTER — Encounter: Payer: Self-pay | Admitting: Podiatry

## 2018-12-10 NOTE — Telephone Encounter (Signed)
Called pt post surgery; no answer, was unable to leave VM

## 2018-12-12 ENCOUNTER — Ambulatory Visit (INDEPENDENT_AMBULATORY_CARE_PROVIDER_SITE_OTHER): Payer: Medicaid Other

## 2018-12-12 ENCOUNTER — Ambulatory Visit (INDEPENDENT_AMBULATORY_CARE_PROVIDER_SITE_OTHER): Payer: Self-pay | Admitting: Podiatry

## 2018-12-12 ENCOUNTER — Other Ambulatory Visit: Payer: Self-pay

## 2018-12-12 VITALS — Temp 97.2°F

## 2018-12-12 DIAGNOSIS — M2041 Other hammer toe(s) (acquired), right foot: Secondary | ICD-10-CM

## 2018-12-12 DIAGNOSIS — Z9889 Other specified postprocedural states: Secondary | ICD-10-CM
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
## 2018-12-19 ENCOUNTER — Other Ambulatory Visit: Payer: Self-pay

## 2018-12-19 ENCOUNTER — Ambulatory Visit (INDEPENDENT_AMBULATORY_CARE_PROVIDER_SITE_OTHER): Payer: Self-pay | Admitting: Podiatry

## 2018-12-19 VITALS — Temp 97.7°F

## 2018-12-19 DIAGNOSIS — M2041 Other hammer toe(s) (acquired), right foot: Secondary | ICD-10-CM

## 2018-12-19 DIAGNOSIS — Z9889 Other specified postprocedural states: Secondary | ICD-10-CM
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
## 2019-01-16 ENCOUNTER — Encounter: Payer: Self-pay | Admitting: Podiatry

## 2019-01-16 ENCOUNTER — Ambulatory Visit (INDEPENDENT_AMBULATORY_CARE_PROVIDER_SITE_OTHER): Payer: Medicaid Other | Admitting: Podiatry

## 2019-01-16 ENCOUNTER — Other Ambulatory Visit: Payer: Self-pay

## 2019-01-16 DIAGNOSIS — M2041 Other hammer toe(s) (acquired), right foot: Secondary | ICD-10-CM

## 2019-01-16 DIAGNOSIS — Z9889 Other specified postprocedural states: Secondary | ICD-10-CM

## 2019-01-18 ENCOUNTER — Telehealth: Payer: Self-pay | Admitting: Cardiology

## 2019-01-18 NOTE — Telephone Encounter (Signed)
Virtual Visit Pre-Appointment Phone Call  "(Name), I am calling you today to discuss your upcoming appointment. We are currently trying to limit exposure to the virus that causes COVID-19 by seeing patients at home rather than in the office."  1. "What is the BEST phone number to call the day of the visit?" - [phone]  2. Do you have or have access to (through a family member/friend) a smartphone with video capability that we can use for your visit?" a. If yes - list this number in appt notes as cell (if different from BEST phone #) and list the appointment type as a VIDEO visit in appointment notes b. If no - list the appointment type as a PHONE visit in appointment notes  3. Confirm consent - "In the setting of the current Covid19 crisis, you are scheduled for a (phone or video) visit with your provider on (date) at (time).  Just as we do with many in-office visits, in order for you to participate in this visit, we must obtain consent.  If you'd like, I can send this to your mychart (if signed up) or email for you to review.  Otherwise, I can obtain your verbal consent now.  All virtual visits are billed to your insurance company just like a normal visit would be.  By agreeing to a virtual visit, we'd like you to understand that the technology does not allow for your provider to perform an examination, and thus may limit your provider's ability to fully assess your condition. If your provider identifies any concerns that need to be evaluated in person, we will make arrangements to do so.  Finally, though the technology is pretty good, we cannot assure that it will always work on either your or our end, and in the setting of a video visit, we may have to convert it to a phone-only visit.  In either situation, we cannot ensure that we have a secure connection.  Are you willing to proceed?" STAFF: Did the patient verbally acknowledge consent to telehealth visit? Document YES/NO here:  YES   4. Advise patient to be prepared - "Two hours prior to your appointment, go ahead and check your blood pressure, pulse, oxygen saturation, and your weight (if you have the equipment to check those) and write them all down. When your visit starts, your provider will ask you for this information. If you have an Apple Watch or Kardia device, please plan to have heart rate information ready on the day of your appointment. Please have a pen and paper handy nearby the day of the visit as well."  5. Give patient instructions for MyChart download to smartphone OR Doximity/Doxy.me as below if video visit (depending on what platform provider is using)  6. Inform patient they will receive a phone call 15 minutes prior to their appointment time (may be from unknown caller ID) so they should be prepared to answer    TELEPHONE CALL NOTE  IMRE GUERRIERI has been deemed a candidate for a follow-up tele-health visit to limit community exposure during the Covid-19 pandemic. I spoke with the patient via phone to ensure availability of phone/video source, confirm preferred email & phone number, and discuss instructions and expectations.  I reminded Brian Caldwell to be prepared with any vital sign and/or heart rhythm information that could potentially be obtained via home monitoring, at the time of his visit. I reminded Brian Caldwell to expect a phone call prior to his visit.  Lynnda Child Slaughter 01/18/2019 1:12 PM   INSTRUCTIONS FOR DOWNLOADING THE MYCHART APP TO SMARTPHONE  - The patient must first make sure to have activated MyChart and know their login information - If Apple, go to [employer] and type in MyChart in the search bar and download the app. If Android, ask patient to go to [employer] and type in [place] in the search bar and download the app. The app is free but as with any other app downloads, their phone may require them to verify saved payment information or Apple/Android  password.  - The patient will need to then log into the app with their MyChart username and password, and select [facility] as their healthcare provider to link the account. When it is time for your visit, go to the MyChart app, find appointments, and click Begin Video Visit. Be sure to Select Allow for your device to access the Microphone and Camera for your visit. You will then be connected, and your provider will be with you shortly.  **If they have any issues connecting, or need assistance please contact MyChart service desk [phone])**  **If using a computer, in order to ensure the best quality for their visit they will need to use either of the following Internet Browsers: [employer], or Google Chrome**  IF USING DOXIMITY or DOXY.ME - The patient will receive a link just prior to their visit by text.     FULL LENGTH CONSENT FOR TELE-HEALTH VISIT   I hereby voluntarily request, consent and authorize [facility] and its employed or contracted physicians, physician assistants, nurse practitioners or other licensed health care professionals (the Practitioner), to provide me with telemedicine health care services (the Services") as deemed necessary by the treating Practitioner. I acknowledge and consent to receive the Services by the Practitioner via telemedicine. I understand that the telemedicine visit will involve communicating with the Practitioner through live audiovisual communication technology and the disclosure of certain medical information by electronic transmission. I acknowledge that I have been given the opportunity to request an in-person assessment or other available alternative prior to the telemedicine visit and am voluntarily participating in the telemedicine visit.  I understand that I have the right to withhold or withdraw my consent to the use of telemedicine in the course of my care at any time, without affecting my right to future care or treatment,  and that the Practitioner or I may terminate the telemedicine visit at any time. I understand that I have the right to inspect all information obtained and/or recorded in the course of the telemedicine visit and may receive copies of available information for a reasonable fee.  I understand that some of the potential risks of receiving the Services via telemedicine include:   Delay or interruption in medical evaluation due to technological equipment failure or disruption;  Information transmitted may not be sufficient (e.g. poor resolution of images) to allow for appropriate medical decision making by the Practitioner; and/or   In rare instances, security protocols could fail, causing a breach of personal health information.  Furthermore, I acknowledge that it is my responsibility to provide information about my medical history, conditions and care that is complete and accurate to the best of my ability. I acknowledge that Practitioner's advice, recommendations, and/or decision may be based on factors not within their control, such as incomplete or inaccurate data provided by me or distortions of diagnostic images or specimens that may result from electronic transmissions. I understand that the  practice of medicine is not an Chief Strategy Officer and that Practitioner makes no warranties or guarantees regarding treatment outcomes. I acknowledge that I will receive a copy of this consent concurrently upon execution via email to the email address I last provided but may also request a printed copy by calling the office of [facility].    I understand that my insurance will be billed for this visit.   I have read or had this consent read to me.  I understand the contents of this consent, which adequately explains the benefits and risks of the Services being provided via telemedicine.   I have been provided ample opportunity to ask questions regarding this consent and the Services and have had my questions  answered to my satisfaction.  I give my informed consent for the services to be provided through the use of telemedicine in my medical care  By participating in this telemedicine visit I agree to the above.
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
## 2019-01-21 ENCOUNTER — Telehealth: Payer: Medicaid Other | Admitting: Cardiology

## 2019-01-22 ENCOUNTER — Encounter: Payer: Self-pay | Admitting: Cardiology

## 2019-02-15 ENCOUNTER — Other Ambulatory Visit: Payer: Self-pay

## 2019-02-15 DIAGNOSIS — Z20822 Contact with and (suspected) exposure to covid-19: Secondary | ICD-10-CM

## 2019-02-16 LAB — NOVEL CORONAVIRUS, NAA: SARS-CoV-2, NAA: NOT DETECTED

## 2019-02-22 ENCOUNTER — Other Ambulatory Visit: Payer: Self-pay

## 2019-02-22 DIAGNOSIS — Z20822 Contact with and (suspected) exposure to covid-19: Secondary | ICD-10-CM

## 2019-02-24 LAB — NOVEL CORONAVIRUS, NAA: SARS-CoV-2, NAA: NOT DETECTED

## 2019-02-26 ENCOUNTER — Telehealth: Payer: Self-pay | Admitting: General Practice

## 2019-02-26 NOTE — Telephone Encounter (Signed)
Negative COVID results given. Patient results "NOT Detected." Caller expressed understanding. ° °

## 2019-03-06 ENCOUNTER — Ambulatory Visit: Payer: Medicaid Other | Admitting: Family Medicine

## 2019-04-09 ENCOUNTER — Ambulatory Visit: Payer: Medicaid Other | Admitting: Family Medicine

## 2019-04-16 ENCOUNTER — Encounter: Payer: Self-pay | Admitting: Cardiology

## 2019-04-16 ENCOUNTER — Telehealth (INDEPENDENT_AMBULATORY_CARE_PROVIDER_SITE_OTHER): Payer: Medicaid Other | Admitting: Family Medicine

## 2019-04-16 VITALS — Ht 72.0 in | Wt 285.0 lb

## 2019-04-16 DIAGNOSIS — I2510 Atherosclerotic heart disease of native coronary artery without angina pectoris: Secondary | ICD-10-CM | POA: Diagnosis not present

## 2019-04-16 DIAGNOSIS — F17210 Nicotine dependence, cigarettes, uncomplicated: Secondary | ICD-10-CM

## 2019-04-16 DIAGNOSIS — E785 Hyperlipidemia, unspecified: Secondary | ICD-10-CM

## 2019-04-16 DIAGNOSIS — Z8679 Personal history of other diseases of the circulatory system: Secondary | ICD-10-CM

## 2019-04-16 DIAGNOSIS — I10 Essential (primary) hypertension: Secondary | ICD-10-CM

## 2019-04-16 DIAGNOSIS — Z720 Tobacco use: Secondary | ICD-10-CM

## 2019-04-16 DIAGNOSIS — Z79899 Other long term (current) drug therapy: Secondary | ICD-10-CM
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
## 2019-04-16 NOTE — Patient Instructions (Signed)
Your physician wants you to follow-up in: 9 MONTHS WITH DR BRANCH You will receive a reminder letter in the mail two months in advance. If you don't receive a letter, please call our office to schedule the follow-up appointment.  Your physician recommends that you continue on your current medications as directed. Please refer to the Current Medication list given to you today.  Thank you for choosing  [facility]!!    

## 2019-06-10 ENCOUNTER — Emergency Department (HOSPITAL_COMMUNITY): Payer: Medicaid Other

## 2019-06-10 ENCOUNTER — Inpatient Hospital Stay (HOSPITAL_COMMUNITY)
Admission: EM | Admit: 2019-06-10 | Discharge: 2019-06-11 | DRG: 247 | Disposition: A | Discharge status: Home / Self Care | Payer: Medicaid Other | Attending: Cardiology | Admitting: Cardiology

## 2019-06-10 ENCOUNTER — Encounter (HOSPITAL_COMMUNITY): Payer: Self-pay | Admitting: *Deleted

## 2019-06-10 ENCOUNTER — Inpatient Hospital Stay (HOSPITAL_COMMUNITY)
Admission: EM | Disposition: A | Discharge status: Home / Self Care | Payer: Self-pay | Source: Home / Self Care | Attending: Cardiology

## 2019-06-10 ENCOUNTER — Other Ambulatory Visit: Payer: Self-pay

## 2019-06-10 DIAGNOSIS — I10 Essential (primary) hypertension: Secondary | ICD-10-CM | POA: Diagnosis present

## 2019-06-10 DIAGNOSIS — I25110 Atherosclerotic heart disease of native coronary artery with unstable angina pectoris: Secondary | ICD-10-CM | POA: Diagnosis present

## 2019-06-10 DIAGNOSIS — K449 Diaphragmatic hernia without obstruction or gangrene: Secondary | ICD-10-CM | POA: Diagnosis present

## 2019-06-10 DIAGNOSIS — I249 Acute ischemic heart disease, unspecified: Secondary | ICD-10-CM | POA: Diagnosis present

## 2019-06-10 DIAGNOSIS — G8929 Other chronic pain: Secondary | ICD-10-CM | POA: Diagnosis present

## 2019-06-10 DIAGNOSIS — I255 Ischemic cardiomyopathy: Secondary | ICD-10-CM | POA: Diagnosis present

## 2019-06-10 DIAGNOSIS — I700 Atherosclerosis of aorta: Secondary | ICD-10-CM | POA: Diagnosis present

## 2019-06-10 DIAGNOSIS — Z886 Allergy status to analgesic agent status: Secondary | ICD-10-CM

## 2019-06-10 DIAGNOSIS — I519 Heart disease, unspecified: Secondary | ICD-10-CM | POA: Diagnosis present

## 2019-06-10 DIAGNOSIS — Z8249 Family history of ischemic heart disease and other diseases of the circulatory system: Secondary | ICD-10-CM | POA: Diagnosis not present

## 2019-06-10 DIAGNOSIS — S2502XA Major laceration of thoracic aorta, initial encounter: Secondary | ICD-10-CM | POA: Diagnosis present

## 2019-06-10 DIAGNOSIS — Z955 Presence of coronary angioplasty implant and graft: Secondary | ICD-10-CM | POA: Diagnosis not present

## 2019-06-10 DIAGNOSIS — I214 Non-ST elevation (NSTEMI) myocardial infarction: Principal | ICD-10-CM | POA: Diagnosis present

## 2019-06-10 DIAGNOSIS — F17210 Nicotine dependence, cigarettes, uncomplicated: Secondary | ICD-10-CM | POA: Diagnosis not present

## 2019-06-10 DIAGNOSIS — M1990 Unspecified osteoarthritis, unspecified site: Secondary | ICD-10-CM | POA: Diagnosis present

## 2019-06-10 DIAGNOSIS — E785 Hyperlipidemia, unspecified: Secondary | ICD-10-CM | POA: Diagnosis present

## 2019-06-10 DIAGNOSIS — D72829 Elevated white blood cell count, unspecified: Secondary | ICD-10-CM | POA: Diagnosis present

## 2019-06-10 DIAGNOSIS — M549 Dorsalgia, unspecified: Secondary | ICD-10-CM

## 2019-06-10 DIAGNOSIS — Z809 Family history of malignant neoplasm, unspecified: Secondary | ICD-10-CM

## 2019-06-10 DIAGNOSIS — Z87820 Personal history of traumatic brain injury: Secondary | ICD-10-CM | POA: Diagnosis not present

## 2019-06-10 DIAGNOSIS — Z23 Encounter for immunization: Secondary | ICD-10-CM | POA: Diagnosis not present

## 2019-06-10 DIAGNOSIS — I2510 Atherosclerotic heart disease of native coronary artery without angina pectoris: Secondary | ICD-10-CM

## 2019-06-10 DIAGNOSIS — Z888 Allergy status to other drugs, medicaments and biological substances status: Secondary | ICD-10-CM | POA: Diagnosis not present

## 2019-06-10 DIAGNOSIS — Z880 Allergy status to penicillin: Secondary | ICD-10-CM

## 2019-06-10 DIAGNOSIS — I252 Old myocardial infarction: Secondary | ICD-10-CM | POA: Diagnosis not present

## 2019-06-10 DIAGNOSIS — Z20822 Contact with and (suspected) exposure to covid-19: Secondary | ICD-10-CM | POA: Diagnosis present

## 2019-06-10 DIAGNOSIS — Z91048 Other nonmedicinal substance allergy status: Secondary | ICD-10-CM

## 2019-06-10 DIAGNOSIS — R911 Solitary pulmonary nodule: Secondary | ICD-10-CM | POA: Diagnosis not present

## 2019-06-10 DIAGNOSIS — R079 Chest pain, unspecified: Secondary | ICD-10-CM | POA: Diagnosis not present

## 2019-06-10 DIAGNOSIS — Z720 Tobacco use: Secondary | ICD-10-CM | POA: Diagnosis present

## 2019-06-10 DIAGNOSIS — Z9861 Coronary angioplasty status: Secondary | ICD-10-CM | POA: Diagnosis not present

## 2019-06-10 DIAGNOSIS — I493 Ventricular premature depolarization: Secondary | ICD-10-CM | POA: Diagnosis not present

## 2019-06-10 DIAGNOSIS — G629 Polyneuropathy, unspecified: Secondary | ICD-10-CM | POA: Diagnosis not present

## 2019-06-10 HISTORY — PX: CORONARY STENT INTERVENTION: CATH118234

## 2019-06-10 HISTORY — PX: LEFT HEART CATH AND CORONARY ANGIOGRAPHY: CATH118249

## 2019-06-10 HISTORY — PX: CORONARY BALLOON ANGIOPLASTY: CATH118233

## 2019-06-10 HISTORY — PX: INTRAVASCULAR PRESSURE WIRE/FFR STUDY: CATH118243

## 2019-06-10 LAB — TROPONIN I (HIGH SENSITIVITY)
Troponin I (High Sensitivity): 50 ng/L — ABNORMAL HIGH (ref <18)
Troponin I (High Sensitivity): 283 ng/L (ref <18)

## 2019-06-10 LAB — CBC
HCT: 43.6 % (ref 39.0–52.0)
Hemoglobin: 14.6 g/dL (ref 13.0–17.0)
MCH: 31.9 pg (ref 26.0–34.0)
MCHC: 33.5 g/dL (ref 30.0–36.0)
MCV: 95.2 fL (ref 80.0–100.0)
Platelets: 165 10*3/uL (ref 150–400)
RBC: 4.58 MIL/uL (ref 4.22–5.81)
RDW: 12.5 % (ref 11.5–15.5)
WBC: 10.8 10*3/uL — ABNORMAL HIGH (ref 4.0–10.5)
nRBC: 0 % (ref 0.0–0.2)

## 2019-06-10 LAB — COMPREHENSIVE METABOLIC PANEL
ALT: 27 U/L (ref 0–44)
AST: 18 U/L (ref 15–41)
Albumin: 4.2 g/dL (ref 3.5–5.0)
Alkaline Phosphatase: 65 U/L (ref 38–126)
Anion gap: 11 (ref 5–15)
BUN: 9 mg/dL (ref 6–20)
CO2: 25 mmol/L (ref 22–32)
Calcium: 9 mg/dL (ref 8.9–10.3)
Chloride: 102 mmol/L (ref 98–111)
Creatinine, Ser: 0.65 mg/dL (ref 0.61–1.24)
GFR calc Af Amer: >60 mL/min (ref >60)
GFR calc non Af Amer: >60 mL/min (ref >60)
Glucose, Bld: 119 mg/dL — ABNORMAL HIGH (ref 70–99)
Potassium: 3.7 mmol/L (ref 3.5–5.1)
Sodium: 138 mmol/L (ref 135–145)
Total Bilirubin: 0.8 mg/dL (ref 0.3–1.2)
Total Protein: 7.4 g/dL (ref 6.5–8.1)

## 2019-06-10 LAB — CBC WITH DIFFERENTIAL/PLATELET
Abs Immature Granulocytes: 0.06 10*3/uL (ref 0.00–0.07)
Basophils Absolute: 0 10*3/uL (ref 0.0–0.1)
Basophils Relative: 0 %
Eosinophils Absolute: 0.1 10*3/uL (ref 0.0–0.5)
Eosinophils Relative: 1 %
HCT: 47.8 % (ref 39.0–52.0)
Hemoglobin: 16.2 g/dL (ref 13.0–17.0)
Immature Granulocytes: 0 %
Lymphocytes Relative: 14 %
Lymphs Abs: 2.1 10*3/uL (ref 0.7–4.0)
MCH: 32.2 pg (ref 26.0–34.0)
MCHC: 33.9 g/dL (ref 30.0–36.0)
MCV: 95 fL (ref 80.0–100.0)
Monocytes Absolute: 1 10*3/uL (ref 0.1–1.0)
Monocytes Relative: 7 %
Neutro Abs: 11.6 10*3/uL — ABNORMAL HIGH (ref 1.7–7.7)
Neutrophils Relative %: 78 %
Platelets: 181 10*3/uL (ref 150–400)
RBC: 5.03 MIL/uL (ref 4.22–5.81)
RDW: 12.5 % (ref 11.5–15.5)
WBC: 15 10*3/uL — ABNORMAL HIGH (ref 4.0–10.5)
nRBC: 0 % (ref 0.0–0.2)

## 2019-06-10 LAB — POCT ACTIVATED CLOTTING TIME
Activated Clotting Time: 257 seconds
Activated Clotting Time: 268 seconds
Activated Clotting Time: 555 seconds
Activated Clotting Time: 279 seconds
Activated Clotting Time: 290 seconds

## 2019-06-10 LAB — RESPIRATORY PANEL BY RT PCR (FLU A&B, COVID)
Influenza A by PCR: NEGATIVE
Influenza B by PCR: NEGATIVE
SARS Coronavirus 2 by RT PCR: NEGATIVE

## 2019-06-10 LAB — HIV ANTIBODY (ROUTINE TESTING W REFLEX): HIV Screen 4th Generation wRfx: NONREACTIVE

## 2019-06-10 LAB — TSH: TSH: 1.354 u[IU]/mL (ref 0.350–4.500)

## 2019-06-10 LAB — VITAMIN D 25 HYDROXY (VIT D DEFICIENCY, FRACTURES): Vit D, 25-Hydroxy: 7.37 ng/mL — ABNORMAL LOW (ref 30–100)

## 2019-06-10 LAB — CREATININE, SERUM
Creatinine, Ser: 0.8 mg/dL (ref 0.61–1.24)
GFR calc Af Amer: >60 mL/min (ref >60)
GFR calc non Af Amer: >60 mL/min (ref >60)

## 2019-06-10 LAB — D-DIMER, QUANTITATIVE: D-Dimer, Quant: 0.47 ug/mL-FEU (ref 0.00–0.50)

## 2019-06-10 LAB — APTT: aPTT: 32 seconds (ref 24–36)

## 2019-06-10 SURGERY — LEFT HEART CATH AND CORONARY ANGIOGRAPHY
Anesthesia: LOCAL

## 2019-06-10 MED ORDER — HEPARIN SODIUM (PORCINE) 1000 UNIT/ML IJ SOLN
INTRAMUSCULAR | Status: AC
  Filled 2019-06-10: qty 1

## 2019-06-10 MED ORDER — CARVEDILOL 6.25 MG PO TABS
6.2500 mg | ORAL_TABLET | Freq: Two times a day (BID) | ORAL | Status: DC
  Administered 2019-06-10 – 2019-06-11 (×2): 6.25 mg via ORAL
  Filled 2019-06-10 (×2): qty 1

## 2019-06-10 MED ORDER — ACETAMINOPHEN 325 MG PO TABS: 650.0000 mg | ORAL_TABLET | ORAL | Status: DC | PRN

## 2019-06-10 MED ORDER — INFLUENZA VAC SPLIT QUAD 0.5 ML IM SUSY
0.5000 mL | PREFILLED_SYRINGE | INTRAMUSCULAR | Status: AC
  Administered 2019-06-11: 0.5 mL via INTRAMUSCULAR
  Filled 2019-06-10: qty 0.5

## 2019-06-10 MED ORDER — IOHEXOL 350 MG/ML SOLN
INTRAVENOUS | Status: DC | PRN
  Administered 2019-06-10: 275 mL via INTRA_ARTERIAL

## 2019-06-10 MED ORDER — BUPRENORPHINE HCL-NALOXONE HCL 8-2 MG SL SUBL: 1.0000 | SUBLINGUAL_TABLET | Freq: Every day | SUBLINGUAL | Status: DC

## 2019-06-10 MED ORDER — TICAGRELOR 90 MG PO TABS
ORAL_TABLET | ORAL | Status: AC
  Filled 2019-06-10: qty 1

## 2019-06-10 MED ORDER — BISACODYL 5 MG PO TBEC: 5.0000 mg | DELAYED_RELEASE_TABLET | Freq: Every day | ORAL | Status: DC | PRN

## 2019-06-10 MED ORDER — LIDOCAINE HCL (PF) 1 % IJ SOLN
INTRAMUSCULAR | Status: DC | PRN
  Administered 2019-06-10: 3 mL

## 2019-06-10 MED ORDER — ACETAMINOPHEN 650 MG RE SUPP: 650.0000 mg | Freq: Four times a day (QID) | RECTAL | Status: DC | PRN

## 2019-06-10 MED ORDER — FENTANYL CITRATE (PF) 100 MCG/2ML IJ SOLN
INTRAMUSCULAR | Status: AC
  Filled 2019-06-10: qty 2

## 2019-06-10 MED ORDER — SODIUM CHLORIDE 0.9 % IV SOLN: INTRAVENOUS | Status: DC

## 2019-06-10 MED ORDER — TICAGRELOR 90 MG PO TABS
90.0000 mg | ORAL_TABLET | Freq: Two times a day (BID) | ORAL | Status: DC
  Administered 2019-06-10 – 2019-06-11 (×2): 90 mg via ORAL
  Filled 2019-06-10 (×2): qty 1

## 2019-06-10 MED ORDER — NITROGLYCERIN IN D5W 200-5 MCG/ML-% IV SOLN
5.0000 ug/min | INTRAVENOUS | Status: DC
  Administered 2019-06-10: 5 ug/min via INTRAVENOUS
  Filled 2019-06-10: qty 250

## 2019-06-10 MED ORDER — NITROGLYCERIN 1 MG/10 ML FOR IR/CATH LAB
INTRA_ARTERIAL | Status: DC | PRN
  Administered 2019-06-10 (×4): 200 ug via INTRACORONARY

## 2019-06-10 MED ORDER — HEPARIN (PORCINE) IN NACL 2000-0.9 UNIT/L-% IV SOLN
INTRAVENOUS | Status: AC
  Filled 2019-06-10: qty 1000

## 2019-06-10 MED ORDER — LISINOPRIL 5 MG PO TABS
5.0000 mg | ORAL_TABLET | Freq: Every day | ORAL | Status: DC
  Administered 2019-06-11: 5 mg via ORAL
  Filled 2019-06-10: qty 1

## 2019-06-10 MED ORDER — ONDANSETRON HCL 4 MG/2ML IJ SOLN: 4.0000 mg | Freq: Four times a day (QID) | INTRAMUSCULAR | Status: DC | PRN

## 2019-06-10 MED ORDER — SODIUM CHLORIDE 0.9% FLUSH
3.0000 mL | Freq: Two times a day (BID) | INTRAVENOUS | Status: DC
  Administered 2019-06-10 – 2019-06-11 (×2): 3 mL via INTRAVENOUS

## 2019-06-10 MED ORDER — HEPARIN (PORCINE) IN NACL 1000-0.9 UT/500ML-% IV SOLN
INTRAVENOUS | Status: DC | PRN
  Administered 2019-06-10: 500 mL

## 2019-06-10 MED ORDER — ENOXAPARIN SODIUM 40 MG/0.4ML ~~LOC~~ SOLN
40.0000 mg | SUBCUTANEOUS | Status: DC
  Administered 2019-06-11: 40 mg via SUBCUTANEOUS
  Filled 2019-06-10: qty 0.4

## 2019-06-10 MED ORDER — NITROGLYCERIN 1 MG/10 ML FOR IR/CATH LAB
INTRA_ARTERIAL | Status: AC
  Filled 2019-06-10: qty 10

## 2019-06-10 MED ORDER — CYCLOBENZAPRINE HCL 10 MG PO TABS: 5.0000 mg | ORAL_TABLET | Freq: Two times a day (BID) | ORAL | Status: DC | PRN

## 2019-06-10 MED ORDER — FENTANYL CITRATE (PF) 100 MCG/2ML IJ SOLN
INTRAMUSCULAR | Status: DC | PRN
  Administered 2019-06-10: 25 ug via INTRAVENOUS
  Administered 2019-06-10 (×2): 50 ug via INTRAVENOUS
  Administered 2019-06-10 (×2): 25 ug via INTRAVENOUS

## 2019-06-10 MED ORDER — ONDANSETRON HCL 4 MG PO TABS: 4.0000 mg | ORAL_TABLET | Freq: Four times a day (QID) | ORAL | Status: DC | PRN

## 2019-06-10 MED ORDER — DIAZEPAM 5 MG PO TABS: 5.0000 mg | ORAL_TABLET | Freq: Four times a day (QID) | ORAL | Status: DC | PRN

## 2019-06-10 MED ORDER — ACETAMINOPHEN 325 MG PO TABS
650.0000 mg | ORAL_TABLET | Freq: Four times a day (QID) | ORAL | Status: DC | PRN
  Administered 2019-06-10: 650 mg via ORAL
  Filled 2019-06-10: qty 2

## 2019-06-10 MED ORDER — IPRATROPIUM-ALBUTEROL 0.5-2.5 (3) MG/3ML IN SOLN: 3.0000 mL | Freq: Four times a day (QID) | RESPIRATORY_TRACT | Status: DC | PRN

## 2019-06-10 MED ORDER — VERAPAMIL HCL 2.5 MG/ML IV SOLN
INTRAVENOUS | Status: AC
  Filled 2019-06-10: qty 2

## 2019-06-10 MED ORDER — LIDOCAINE HCL (PF) 1 % IJ SOLN
INTRAMUSCULAR | Status: AC
  Filled 2019-06-10: qty 30

## 2019-06-10 MED ORDER — ALPRAZOLAM 0.25 MG PO TABS
0.2500 mg | ORAL_TABLET | Freq: Three times a day (TID) | ORAL | Status: DC | PRN
  Administered 2019-06-10: 0.25 mg via ORAL
  Filled 2019-06-10: qty 1

## 2019-06-10 MED ORDER — VERAPAMIL HCL 2.5 MG/ML IV SOLN
INTRAVENOUS | Status: DC | PRN
  Administered 2019-06-10: 8 mL via INTRA_ARTERIAL

## 2019-06-10 MED ORDER — IOHEXOL 350 MG/ML SOLN
100.0000 mL | Freq: Once | INTRAVENOUS | Status: AC | PRN
  Administered 2019-06-10: 100 mL via INTRAVENOUS

## 2019-06-10 MED ORDER — HEPARIN (PORCINE) 25000 UT/250ML-% IV SOLN
1250.0000 [IU]/h | INTRAVENOUS | Status: DC
  Administered 2019-06-10: 1250 [IU]/h via INTRAVENOUS
  Filled 2019-06-10: qty 250

## 2019-06-10 MED ORDER — ATORVASTATIN CALCIUM 80 MG PO TABS: 80.0000 mg | ORAL_TABLET | Freq: Every day | ORAL | Status: DC

## 2019-06-10 MED ORDER — SODIUM CHLORIDE 0.9% FLUSH: 3.0000 mL | INTRAVENOUS | Status: DC | PRN

## 2019-06-10 MED ORDER — PNEUMOCOCCAL VAC POLYVALENT 25 MCG/0.5ML IJ INJ
0.5000 mL | INJECTION | INTRAMUSCULAR | Status: AC
  Administered 2019-06-11: 0.5 mL via INTRAMUSCULAR
  Filled 2019-06-10: qty 0.5

## 2019-06-10 MED ORDER — MIDAZOLAM HCL 2 MG/2ML IJ SOLN
INTRAMUSCULAR | Status: AC
  Filled 2019-06-10: qty 2

## 2019-06-10 MED ORDER — LABETALOL HCL 5 MG/ML IV SOLN: 10.0000 mg | INTRAVENOUS | Status: AC | PRN

## 2019-06-10 MED ORDER — NICOTINE 21 MG/24HR TD PT24
21.0000 mg | MEDICATED_PATCH | Freq: Every day | TRANSDERMAL | Status: DC
  Administered 2019-06-10 – 2019-06-11 (×2): 21 mg via TRANSDERMAL
  Filled 2019-06-10 (×2): qty 1

## 2019-06-10 MED ORDER — BUPRENORPHINE HCL-NALOXONE HCL 8-2 MG SL SUBL
1.0000 | SUBLINGUAL_TABLET | Freq: Every day | SUBLINGUAL | Status: DC
  Administered 2019-06-10 – 2019-06-11 (×2): 1 via SUBLINGUAL
  Filled 2019-06-10 (×2): qty 1

## 2019-06-10 MED ORDER — NITROGLYCERIN IN D5W 200-5 MCG/ML-% IV SOLN: 0.0000 ug/min | INTRAVENOUS | Status: DC

## 2019-06-10 MED ORDER — SODIUM CHLORIDE 0.9% FLUSH
3.0000 mL | Freq: Two times a day (BID) | INTRAVENOUS | Status: DC
  Administered 2019-06-11: 3 mL via INTRAVENOUS

## 2019-06-10 MED ORDER — SODIUM CHLORIDE 0.9 % IV SOLN: 250.0000 mL | INTRAVENOUS | Status: DC | PRN

## 2019-06-10 MED ORDER — TRAZODONE HCL 50 MG PO TABS
25.0000 mg | ORAL_TABLET | Freq: Every evening | ORAL | Status: DC | PRN
  Administered 2019-06-10: 25 mg via ORAL
  Filled 2019-06-10: qty 1

## 2019-06-10 MED ORDER — HEPARIN (PORCINE) IN NACL 1000-0.9 UT/500ML-% IV SOLN
INTRAVENOUS | Status: AC
  Filled 2019-06-10: qty 500

## 2019-06-10 MED ORDER — ATORVASTATIN CALCIUM 80 MG PO TABS
80.0000 mg | ORAL_TABLET | Freq: Every day | ORAL | Status: DC
  Administered 2019-06-10: 80 mg via ORAL
  Filled 2019-06-10: qty 1

## 2019-06-10 MED ORDER — MIDAZOLAM HCL 2 MG/2ML IJ SOLN
INTRAMUSCULAR | Status: DC | PRN
  Administered 2019-06-10: 1 mg via INTRAVENOUS
  Administered 2019-06-10: 2 mg via INTRAVENOUS
  Administered 2019-06-10: 1 mg via INTRAVENOUS

## 2019-06-10 MED ORDER — PANTOPRAZOLE SODIUM 40 MG IV SOLR
40.0000 mg | INTRAVENOUS | Status: DC
  Administered 2019-06-10: 40 mg via INTRAVENOUS
  Filled 2019-06-10: qty 40

## 2019-06-10 MED ORDER — OXYCODONE HCL 5 MG PO TABS
2.5000 mg | ORAL_TABLET | ORAL | Status: DC | PRN
  Filled 2019-06-10: qty 1

## 2019-06-10 MED ORDER — MORPHINE SULFATE (PF) 4 MG/ML IV SOLN
4.0000 mg | Freq: Once | INTRAVENOUS | Status: AC
  Administered 2019-06-10: 4 mg via INTRAVENOUS
  Filled 2019-06-10: qty 1

## 2019-06-10 MED ORDER — SODIUM CHLORIDE 0.9 % WEIGHT BASED INFUSION
3.0000 mL/kg/h | INTRAVENOUS | Status: AC
  Administered 2019-06-10: 3 mL/kg/h via INTRAVENOUS

## 2019-06-10 MED ORDER — HYDRALAZINE HCL 20 MG/ML IJ SOLN: 10.0000 mg | INTRAMUSCULAR | Status: AC | PRN

## 2019-06-10 MED ORDER — HEPARIN SODIUM (PORCINE) 1000 UNIT/ML IJ SOLN
INTRAMUSCULAR | Status: DC | PRN
  Administered 2019-06-10: 6000 [IU] via INTRAVENOUS
  Administered 2019-06-10: 3000 [IU] via INTRAVENOUS
  Administered 2019-06-10: 2000 [IU] via INTRAVENOUS
  Administered 2019-06-10: 6000 [IU] via INTRAVENOUS

## 2019-06-10 MED ORDER — TICAGRELOR 90 MG PO TABS
ORAL_TABLET | ORAL | Status: DC | PRN
  Administered 2019-06-10: 180 mg via ORAL

## 2019-06-10 MED ORDER — PREGABALIN 75 MG PO CAPS
150.0000 mg | ORAL_CAPSULE | Freq: Two times a day (BID) | ORAL | Status: DC
  Administered 2019-06-10 – 2019-06-11 (×2): 150 mg via ORAL
  Filled 2019-06-10 (×2): qty 2

## 2019-06-10 MED ORDER — SODIUM CHLORIDE 0.9 % WEIGHT BASED INFUSION: 1.0000 mL/kg/h | INTRAVENOUS | Status: DC

## 2019-06-10 MED ORDER — HEPARIN BOLUS VIA INFUSION
4000.0000 [IU] | Freq: Once | INTRAVENOUS | Status: AC
  Administered 2019-06-10: 4000 [IU] via INTRAVENOUS

## 2019-06-10 SURGICAL SUPPLY — 19 items
BALLN SAPPHIRE ~~LOC~~ 3.5X15 (BALLOONS) ×1 IMPLANT
BALLN WOLVERINE 3.00X10 (BALLOONS) ×2
BALLN ~~LOC~~ EMERGE MR 3.75X12 (BALLOONS) ×2
BALLOON WOLVERINE 3.00X10 (BALLOONS) IMPLANT
BALLOON ~~LOC~~ EMERGE MR 3.75X12 (BALLOONS) IMPLANT
CATH OPTITORQUE TIG 4.0 5F (CATHETERS) ×1 IMPLANT
CATH VISTA GUIDE 6FR XBLAD3.5 (CATHETERS) ×1 IMPLANT
DEVICE RAD COMP TR BAND LRG (VASCULAR PRODUCTS) ×1 IMPLANT
GLIDESHEATH SLEND SS 6F .021 (SHEATH) ×1 IMPLANT
GUIDEWIRE INQWIRE 1.5J.035X260 (WIRE) IMPLANT
GUIDEWIRE PRESSURE COMET II (WIRE) ×1 IMPLANT
INQWIRE 1.5J .035X260CM (WIRE) ×2
KIT ENCORE 26 ADVANTAGE (KITS) ×1 IMPLANT
KIT HEART LEFT (KITS) ×2 IMPLANT
PACK CARDIAC CATHETERIZATION (CUSTOM PROCEDURE TRAY) ×2 IMPLANT
STENT RESOLUTE ONYX 3.5X18 (Permanent Stent) ×1 IMPLANT
TRANSDUCER W/STOPCOCK (MISCELLANEOUS) ×2 IMPLANT
TUBING CIL FLEX 10 FLL-RA (TUBING) ×2 IMPLANT
WIRE COUGAR XT STRL 190CM (WIRE) ×1 IMPLANT
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
## 2019-06-10 NOTE — ED Provider Notes (Signed)
Care assumed from Dr. Lynelle Doctor. Patient with CAD s/p stents, traumatic aortic dissection s/p repair.  Presents with right-sided chest and upper back pain similar to previous MI.  First troponin was 50.  EKG was unchanged.  He is getting CTA chest to assess his aorta and then will need admission for his suspected NSTEMI.   CTA shows postsurgical changes to his ascending aorta without significant change.  No acute abnormality seen. D/w Dr. Nadene Rubins of radiology.  Troponin increased to 283. Still complaining of R upper back pain. Giving IV morphine and starting heparin gtt.   Discussed with Dr. Diona Browner cardiology who will evaluate.  He states patient may need transfer to [facility]. Hospitalist admission discussed with Dr. Laural Benes  CRITICAL CARE Performed by: Glynn Octave Total critical care time: 35 minutes Critical care time was exclusive of separately billable procedures and treating other patients. Critical care was necessary to treat or prevent imminent or life-threatening deterioration. Critical care was time spent personally by me on the following activities: development of treatment plan with patient and/or surrogate as well as nursing, discussions with consultants, evaluation of patient's response to treatment, examination of patient, obtaining history from patient or surrogate, ordering and performing treatments and interventions, ordering and review of laboratory studies, ordering and review of radiographic studies, pulse oximetry and re-evaluation of patient's condition.    Glynn Octave, MD 06/10/19 1650
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
## 2019-06-10 NOTE — Progress Notes (Signed)
RN paged NP.  Pt did not have his daily Suboxone dose prior to coming to the hospital today at 5:30am and has not had a dose today.  First dose is scheduled for 10am on 06/11/19.  Consulted with pharmacy who did not feel it was necessary to reschedule daily dose at this dosing and added one dose tonight only, to continue with daily 10am dosing in the am.

## 2019-06-10 NOTE — Interval H&P Note (Signed)
Cath Lab Visit (complete for each Cath Lab visit)  Clinical Evaluation Leading to the Procedure:   ACS: No.  Non-ACS:    Anginal Classification: CCS IV  Anti-ischemic medical therapy: Minimal Therapy (1 class of medications)  Non-Invasive Test Results: No non-invasive testing performed  Prior CABG: No previous CABG      History and Physical Interval Note:  06/10/2019 12:36 PM  Brian Caldwell  has presented today for surgery, with the diagnosis of CHEST PAIN.  The various methods of treatment have been discussed with the patient and family. After consideration of risks, benefits and other options for treatment, the patient has consented to  Procedure(s): LEFT HEART CATH AND CORONARY ANGIOGRAPHY (N/A) as a surgical intervention.  The patient's history has been reviewed, patient examined, no change in status, stable for surgery.  I have reviewed the patient's chart and labs.  Questions were answered to the patient's satisfaction.     Brian Caldwell

## 2019-06-10 NOTE — ED Notes (Signed)
Report given to [facility]. 

## 2019-06-10 NOTE — Progress Notes (Signed)
Patient brought to cath lab holding area, awaiting Dr. Tresa Endo to come speak to patient prior to his heart catheterization.  Report from [facility].  VSS
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
## 2019-06-10 NOTE — ED Notes (Signed)
Date and time results received: 06/10/19 8:53 AM  (use smartphrase ".now" to insert current time)  Test: Troponin Critical Value: 283  Name of Provider Notified: Rancour  Orders Received? Or Actions Taken?: Orders Received - See Orders for details
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
## 2019-06-10 NOTE — ED Triage Notes (Signed)
Pt c/o chest pain that started last night with sob. Pt states that he has had two mi's in the past and this feels the same,
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
## 2019-06-11 DIAGNOSIS — Z720 Tobacco use: Secondary | ICD-10-CM

## 2019-06-11 DIAGNOSIS — I214 Non-ST elevation (NSTEMI) myocardial infarction: Principal | ICD-10-CM

## 2019-06-11 DIAGNOSIS — Z9861 Coronary angioplasty status: Secondary | ICD-10-CM

## 2019-06-11 DIAGNOSIS — I249 Acute ischemic heart disease, unspecified: Secondary | ICD-10-CM

## 2019-06-11 DIAGNOSIS — I519 Heart disease, unspecified: Secondary | ICD-10-CM

## 2019-06-11 DIAGNOSIS — I255 Ischemic cardiomyopathy: Secondary | ICD-10-CM

## 2019-06-11 DIAGNOSIS — I2510 Atherosclerotic heart disease of native coronary artery without angina pectoris: Secondary | ICD-10-CM

## 2019-06-11 LAB — LIPID PANEL
Cholesterol: 106 mg/dL (ref 0–200)
HDL: 30 mg/dL — ABNORMAL LOW (ref >40)
LDL Cholesterol: 37 mg/dL (ref 0–99)
Total CHOL/HDL Ratio: 3.5 RATIO
Triglycerides: 194 mg/dL — ABNORMAL HIGH (ref <150)
VLDL: 39 mg/dL (ref 0–40)

## 2019-06-11 LAB — CBC WITH DIFFERENTIAL/PLATELET
Abs Immature Granulocytes: 0.04 10*3/uL (ref 0.00–0.07)
Basophils Absolute: 0 10*3/uL (ref 0.0–0.1)
Basophils Relative: 0 %
Eosinophils Absolute: 0.2 10*3/uL (ref 0.0–0.5)
Eosinophils Relative: 2 %
HCT: 41.7 % (ref 39.0–52.0)
Hemoglobin: 13.9 g/dL (ref 13.0–17.0)
Immature Granulocytes: 0 %
Lymphocytes Relative: 33 %
Lymphs Abs: 3.3 10*3/uL (ref 0.7–4.0)
MCH: 31.7 pg (ref 26.0–34.0)
MCHC: 33.3 g/dL (ref 30.0–36.0)
MCV: 95 fL (ref 80.0–100.0)
Monocytes Absolute: 0.9 10*3/uL (ref 0.1–1.0)
Monocytes Relative: 9 %
Neutro Abs: 5.6 10*3/uL (ref 1.7–7.7)
Neutrophils Relative %: 56 %
Platelets: 175 10*3/uL (ref 150–400)
RBC: 4.39 MIL/uL (ref 4.22–5.81)
RDW: 12.5 % (ref 11.5–15.5)
WBC: 10 10*3/uL (ref 4.0–10.5)
nRBC: 0 % (ref 0.0–0.2)

## 2019-06-11 LAB — COMPREHENSIVE METABOLIC PANEL
ALT: 25 U/L (ref 0–44)
AST: 27 U/L (ref 15–41)
Albumin: 3.1 g/dL — ABNORMAL LOW (ref 3.5–5.0)
Alkaline Phosphatase: 50 U/L (ref 38–126)
Anion gap: 9 (ref 5–15)
BUN: 6 mg/dL (ref 6–20)
CO2: 24 mmol/L (ref 22–32)
Calcium: 8.7 mg/dL — ABNORMAL LOW (ref 8.9–10.3)
Chloride: 107 mmol/L (ref 98–111)
Creatinine, Ser: 0.76 mg/dL (ref 0.61–1.24)
GFR calc Af Amer: >60 mL/min (ref >60)
GFR calc non Af Amer: >60 mL/min (ref >60)
Glucose, Bld: 116 mg/dL — ABNORMAL HIGH (ref 70–99)
Potassium: 3.8 mmol/L (ref 3.5–5.1)
Sodium: 140 mmol/L (ref 135–145)
Total Bilirubin: 0.8 mg/dL (ref 0.3–1.2)
Total Protein: 5.8 g/dL — ABNORMAL LOW (ref 6.5–8.1)

## 2019-06-11 LAB — MAGNESIUM: Magnesium: 2 mg/dL (ref 1.7–2.4)

## 2019-06-11 LAB — HEMOGLOBIN A1C
Hgb A1c MFr Bld: 5.4 % (ref 4.8–5.6)
Mean Plasma Glucose: 108 mg/dL

## 2019-06-11 LAB — BRAIN NATRIURETIC PEPTIDE: B Natriuretic Peptide: 18 pg/mL (ref 0.0–100.0)

## 2019-06-11 MED ORDER — TICAGRELOR 90 MG PO TABS: 90.0000 mg | ORAL_TABLET | Freq: Two times a day (BID) | ORAL | 2 refills | Status: DC

## 2019-06-11 MED ORDER — NICOTINE 21 MG/24HR TD PT24: 21.0000 mg | MEDICATED_PATCH | Freq: Every day | TRANSDERMAL | 0 refills | Status: DC

## 2019-06-11 MED FILL — NICOTINE 21 MG/24HR PATCH: 21 | 28 days supply | Qty: 28 | Fill #0

## 2019-06-11 MED FILL — BRILINTA 90 MG TABLET: 90 | 30 days supply | Qty: 60 | Fill #0

## 2019-06-11 MED FILL — Heparin Sod (Porcine)-NaCl IV Soln 1000 Unit/500ML-0.9%: INTRAVENOUS | Qty: 500 | Status: AC

## 2019-06-11 NOTE — Discharge Instructions (Signed)

## 2019-06-11 NOTE — TOC Progression Note (Signed)
Transition of Care [facility]) - Progression Note    Patient Details  Name: Brian Caldwell MRN: [id number] Date of Birth: 03-28-1976  [facility] CM/SW Contact  Leone Haven, RN Phone Number: 06/11/2019, 12:24 PM  Clinical Narrative:    Patient is for dc today, Staff RN gave patient the brilinta coupon.  He has medicaid also which has co pay of 3.00.  TOC filled the free 30 day brilinta.        Expected Discharge Plan and Services           Expected Discharge Date: 06/11/19                                     Social Determinants of Health (SDOH) Interventions    Readmission Risk Interventions No flowsheet data found.

## 2019-06-11 NOTE — Progress Notes (Signed)
CARDIAC REHAB PHASE I   PRE:  Rate/Rhythm: 93 SR  BP:  Supine:   Sitting: 119/72  Standing:    SaO2: 96%RA  MODE:  Ambulation: 470 ft   POST:  Rate/Rhythm: 108 ST  BP:  Supine:   Sitting: 130/77  Standing:    SaO2: 97%RA 0815-0940 Pt walked 470 ft on RA with steady gait. Tolerated well with no CP. Reviewed MI restrictions, NTG use, heart healthy food choices, walking as tolerated with orthopedic issues, CRP 2 and smoking cessation. Gave smoking cessation handout. Pt stated he has started the nicotine patches and has started seeing a therapist the last 2 weeks to assist with weight and smoking cessation.  Discussed importance of brililnta with stent. Referred to [place] CRP 2. Transportation may be issue. Pt interested in Virtual APP. Understanding voiced of ed.  Pt is interested in participating in Virtual Cardiac and Pulmonary Rehab. Pt advised that Virtual Cardiac and Pulmonary Rehab is provided at no cost to the patient.  Checklist:  1. Pt has smart device  ie smartphone and/or ipad for downloading an app  Yes 2. Reliable internet/wifi service    Yes 3. Understands how to use their smartphone and navigate within an app.  Yes   Pt verbalized understanding and is in agreement.    Brian Nutting, RN BSN  06/11/2019 9:35 AM

## 2019-06-11 NOTE — Plan of Care (Signed)
  Problem: Education: Goal: Knowledge of General Education information will improve Description: Including pain rating scale, medication(s)/side effects and non-pharmacologic comfort measures Outcome: Completed/Met   Problem: Health Behavior/Discharge Planning: Goal: Ability to manage health-related needs will improve Outcome: Completed/Met   Problem: Clinical Measurements: Goal: Ability to maintain clinical measurements within normal limits will improve Outcome: Completed/Met Goal: Will remain free from infection Outcome: Completed/Met Goal: Diagnostic test results will improve Outcome: Completed/Met Goal: Respiratory complications will improve Outcome: Completed/Met Goal: Cardiovascular complication will be avoided Outcome: Completed/Met   Problem: Elimination: Goal: Will not experience complications related to bowel motility Outcome: Not Applicable Goal: Will not experience complications related to urinary retention Outcome: Not Applicable   Problem: Pain Managment: Goal: General experience of comfort will improve Outcome: Completed/Met   Problem: Safety: Goal: Ability to remain free from injury will improve Outcome: Completed/Met   Problem: Skin Integrity: Goal: Risk for impaired skin integrity will decrease Outcome: Completed/Met   Problem: Education: Goal: Understanding of CV disease, CV risk reduction, and recovery process will improve Outcome: Completed/Met   Problem: Cardiovascular: Goal: Ability to achieve and maintain adequate cardiovascular perfusion will improve Outcome: Completed/Met Goal: Vascular access site(s) Level 0-1 will be maintained Outcome: Completed/Met   Problem: Health Behavior/Discharge Planning: Goal: Ability to safely manage health-related needs after discharge will improve Outcome: Completed/Met
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
## 2019-06-19 ENCOUNTER — Ambulatory Visit (INDEPENDENT_AMBULATORY_CARE_PROVIDER_SITE_OTHER): Payer: Medicaid Other | Admitting: Student

## 2019-06-19 ENCOUNTER — Encounter: Payer: Self-pay | Admitting: Student

## 2019-06-19 ENCOUNTER — Other Ambulatory Visit: Payer: Self-pay

## 2019-06-19 VITALS — BP 127/72 | HR 97 | Temp 97.8°F | Ht 72.0 in | Wt 281.0 lb

## 2019-06-19 DIAGNOSIS — E785 Hyperlipidemia, unspecified: Secondary | ICD-10-CM

## 2019-06-19 DIAGNOSIS — I2510 Atherosclerotic heart disease of native coronary artery without angina pectoris: Secondary | ICD-10-CM | POA: Diagnosis not present

## 2019-06-19 DIAGNOSIS — Z8679 Personal history of other diseases of the circulatory system: Secondary | ICD-10-CM | POA: Diagnosis not present

## 2019-06-19 DIAGNOSIS — I10 Essential (primary) hypertension: Secondary | ICD-10-CM

## 2019-06-19 DIAGNOSIS — Z720 Tobacco use: Secondary | ICD-10-CM

## 2019-06-19 MED ORDER — NITROGLYCERIN 0.4 MG SL SUBL: SUBLINGUAL_TABLET | SUBLINGUAL | 5 refills | Status: DC

## 2019-06-19 MED ORDER — TICAGRELOR 90 MG PO TABS: 90.0000 mg | ORAL_TABLET | Freq: Two times a day (BID) | ORAL | 2 refills | Status: DC

## 2019-06-19 NOTE — Patient Instructions (Signed)
Medication Instructions:  Your physician recommends that you continue on your current medications as directed. Please refer to the Current Medication list given to you today.  *If you need a refill on your cardiac medications before your next appointment, please call your pharmacy*  Lab Work: NONE   If you have labs (blood work) drawn today and your tests are completely normal, you will receive your results only by: [place] MyChart Message (if you have MyChart) OR . A paper copy in the mail If you have any lab test that is abnormal or we need to change your treatment, we will call you to review the results.  Testing/Procedures: NONE    Follow-Up: At [facility], you and your health needs are our priority.  As part of our continuing mission to provide you with exceptional heart care, we have created designated Provider Care Teams.  These Care Teams include your primary Cardiologist (physician) and Advanced Practice Providers (APPs -  Physician Assistants and Nurse Practitioners) who all work together to provide you with the care you need, when you need it.  Your next appointment:   3 month(s)  The format for your next appointment:   In Person  Provider:   Dina Rich, MD  Other Instructions Thank you for choosing [facility]!
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
## 2019-06-20 ENCOUNTER — Encounter: Payer: Self-pay | Admitting: Student

## 2019-07-09 ENCOUNTER — Ambulatory Visit: Payer: Medicaid Other | Admitting: Family Medicine

## 2019-07-29 ENCOUNTER — Other Ambulatory Visit: Payer: Self-pay | Admitting: Cardiology

## 2019-09-09 ENCOUNTER — Ambulatory Visit (INDEPENDENT_AMBULATORY_CARE_PROVIDER_SITE_OTHER): Payer: Medicaid Other | Admitting: Cardiology

## 2019-09-09 ENCOUNTER — Other Ambulatory Visit: Payer: Self-pay

## 2019-09-09 ENCOUNTER — Encounter: Payer: Self-pay | Admitting: Cardiology

## 2019-09-09 VITALS — BP 160/80 | HR 105 | Temp 93.5°F | Ht 72.0 in | Wt 295.2 lb

## 2019-09-09 DIAGNOSIS — I10 Essential (primary) hypertension: Secondary | ICD-10-CM

## 2019-09-09 DIAGNOSIS — E782 Mixed hyperlipidemia: Secondary | ICD-10-CM

## 2019-09-09 DIAGNOSIS — I2510 Atherosclerotic heart disease of native coronary artery without angina pectoris: Secondary | ICD-10-CM | POA: Diagnosis not present

## 2019-09-09 NOTE — Patient Instructions (Signed)
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
## 2019-09-16 ENCOUNTER — Other Ambulatory Visit: Payer: Self-pay | Admitting: Cardiology

## 2020-03-16 ENCOUNTER — Ambulatory Visit: Payer: Medicaid Other | Admitting: Cardiology
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
## 2020-03-31 ENCOUNTER — Ambulatory Visit (INDEPENDENT_AMBULATORY_CARE_PROVIDER_SITE_OTHER): Payer: Medicaid Other | Admitting: Cardiology

## 2020-03-31 ENCOUNTER — Encounter: Payer: Self-pay | Admitting: Cardiology

## 2020-03-31 VITALS — BP 144/82 | HR 98 | Ht 72.0 in | Wt 309.6 lb

## 2020-03-31 DIAGNOSIS — R0602 Shortness of breath: Secondary | ICD-10-CM

## 2020-03-31 DIAGNOSIS — E782 Mixed hyperlipidemia: Secondary | ICD-10-CM | POA: Diagnosis not present

## 2020-03-31 DIAGNOSIS — I2510 Atherosclerotic heart disease of native coronary artery without angina pectoris: Secondary | ICD-10-CM

## 2020-03-31 DIAGNOSIS — R600 Localized edema: Secondary | ICD-10-CM

## 2020-03-31 DIAGNOSIS — I10 Essential (primary) hypertension: Secondary | ICD-10-CM | POA: Diagnosis not present

## 2020-03-31 MED ORDER — FUROSEMIDE 20 MG PO TABS: 20.0000 mg | ORAL_TABLET | Freq: Every day | ORAL | 1 refills | Status: DC | PRN

## 2020-03-31 NOTE — Patient Instructions (Signed)
Your physician recommends that you schedule a follow-up appointment in: 4 MONTHS WITH DR [facility]  Your physician has recommended you make the following change in your medication:   TAKE LASIX 20 MG AS NEEDED FOR SWELLING  Your physician has requested that you have an echocardiogram. Echocardiography is a painless test that uses sound waves to create images of your heart. It provides your doctor with information about the size and shape of your heart and how well your heart's chambers and valves are working. This procedure takes approximately one hour. There are no restrictions for this procedure.  Your physician recommends that you return for lab work in: 2 WEEKS BMP/MG  Thank you for choosing [facility]!!
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
## 2020-04-08 ENCOUNTER — Ambulatory Visit (INDEPENDENT_AMBULATORY_CARE_PROVIDER_SITE_OTHER): Payer: Medicaid Other

## 2020-04-08 DIAGNOSIS — R0602 Shortness of breath: Secondary | ICD-10-CM

## 2020-04-08 LAB — ECHOCARDIOGRAM COMPLETE
Area-P 1/2: 2.91 cm2
Calc EF: 56.3 %
S' Lateral: 2.13 cm
Single Plane A2C EF: 52.4 %
Single Plane A4C EF: 60.1 %

## 2020-04-16 ENCOUNTER — Telehealth: Payer: Self-pay | Admitting: *Deleted

## 2020-04-16 NOTE — Telephone Encounter (Signed)
Pt aware.

## 2020-04-16 NOTE — Telephone Encounter (Signed)
-----   Message from Antoine Poche, MD sent at 04/13/2020  9:27 AM EST ----- Echo shows normal heart function   Dominga Ferry MD

## 2020-07-15 ENCOUNTER — Other Ambulatory Visit: Payer: Self-pay | Admitting: Student

## 2020-07-29 ENCOUNTER — Ambulatory Visit: Payer: Medicaid Other | Admitting: Cardiology
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
## 2020-09-11 ENCOUNTER — Encounter: Payer: Self-pay | Admitting: Cardiology

## 2020-09-11 ENCOUNTER — Other Ambulatory Visit: Payer: Self-pay

## 2020-09-11 ENCOUNTER — Ambulatory Visit (INDEPENDENT_AMBULATORY_CARE_PROVIDER_SITE_OTHER): Payer: Medicaid Other | Admitting: Cardiology

## 2020-09-11 VITALS — BP 152/80 | HR 93 | Ht 71.0 in | Wt 315.0 lb

## 2020-09-11 DIAGNOSIS — I2510 Atherosclerotic heart disease of native coronary artery without angina pectoris: Secondary | ICD-10-CM

## 2020-09-11 DIAGNOSIS — E782 Mixed hyperlipidemia: Secondary | ICD-10-CM

## 2020-09-11 DIAGNOSIS — I10 Essential (primary) hypertension: Secondary | ICD-10-CM

## 2020-09-11 DIAGNOSIS — R600 Localized edema: Secondary | ICD-10-CM

## 2020-09-11 MED ORDER — CLOPIDOGREL BISULFATE 75 MG PO TABS: 75.0000 mg | ORAL_TABLET | Freq: Every day | ORAL | 1 refills | Status: DC
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
## 2020-09-11 NOTE — Patient Instructions (Signed)
Your physician recommends that you schedule a follow-up appointment in: 6 MONTHS WITH DR Sheppard [facility] [place]  Your physician has recommended you make the following change in your medication:   STOP BRILINTA  START PLAVIX 75 MG DAILY   RX FOR COMPRESSION STOCKINGS   Your physician recommends that you return for lab work PLEASE FAST 6-8 HOURS PRIOR TO LAB WORK CBC/BMP/LIPIDS/TSH/HGBA1C/TSH  CALL us NEXT WEEK WITH YOU BLOOD PRESSURE READINGS   Thank you for choosing [facility]!!

## 2020-10-20 ENCOUNTER — Other Ambulatory Visit: Payer: Self-pay | Admitting: Cardiology

## 2020-10-20 MED ORDER — LISINOPRIL 5 MG PO TABS: 5.0000 mg | ORAL_TABLET | Freq: Every day | ORAL | 2 refills | Status: DC

## 2020-10-20 MED ORDER — CARVEDILOL 6.25 MG PO TABS: 6.2500 mg | ORAL_TABLET | Freq: Two times a day (BID) | ORAL | 2 refills | Status: DC

## 2020-10-20 MED ORDER — ATORVASTATIN CALCIUM 80 MG PO TABS: 80.0000 mg | ORAL_TABLET | Freq: Every day | ORAL | 2 refills | Status: DC

## 2021-06-19 ENCOUNTER — Other Ambulatory Visit: Payer: Self-pay | Admitting: Student

## 2021-12-17 ENCOUNTER — Other Ambulatory Visit: Payer: Self-pay | Admitting: Student

## 2021-12-17 ENCOUNTER — Other Ambulatory Visit: Payer: Self-pay | Admitting: Cardiology

## 2022-01-26 ENCOUNTER — Other Ambulatory Visit: Payer: Self-pay | Admitting: Cardiology

## 2022-01-26 MED ORDER — CLOPIDOGREL BISULFATE 75 MG PO TABS: 75.0000 mg | ORAL_TABLET | Freq: Every day | ORAL | 1 refills | Status: DC

## 2022-01-26 NOTE — Addendum Note (Signed)
Addended by: Merlene Laughter on: 01/26/2022 04:12 PM   Modules accepted: Orders

## 2022-01-26 NOTE — Telephone Encounter (Signed)
Advised that he needed an OV and scheduled first available 04/05/2022 9:30 am with [facility] office. Advised that brilinta was stopped 08/2020 and he is supposed to be on plavix 75 mg. Says he has not been taking brilinta.Verbalized understanding.

## 2022-03-09 ENCOUNTER — Other Ambulatory Visit: Payer: Self-pay | Admitting: Cardiology

## 2022-04-05 ENCOUNTER — Encounter: Payer: Self-pay | Admitting: Nurse Practitioner

## 2022-04-05 ENCOUNTER — Ambulatory Visit: Payer: Medicaid Other | Attending: Nurse Practitioner | Admitting: Nurse Practitioner

## 2022-04-05 VITALS — BP 126/78 | HR 91 | Ht 71.0 in | Wt 266.8 lb

## 2022-04-05 DIAGNOSIS — I255 Ischemic cardiomyopathy: Secondary | ICD-10-CM

## 2022-04-05 DIAGNOSIS — I10 Essential (primary) hypertension: Secondary | ICD-10-CM | POA: Diagnosis not present

## 2022-04-05 DIAGNOSIS — R600 Localized edema: Secondary | ICD-10-CM | POA: Diagnosis not present

## 2022-04-05 DIAGNOSIS — Z720 Tobacco use: Secondary | ICD-10-CM

## 2022-04-05 DIAGNOSIS — I2510 Atherosclerotic heart disease of native coronary artery without angina pectoris: Secondary | ICD-10-CM | POA: Diagnosis not present

## 2022-04-05 DIAGNOSIS — Z9889 Other specified postprocedural states: Secondary | ICD-10-CM

## 2022-04-05 DIAGNOSIS — E782 Mixed hyperlipidemia: Secondary | ICD-10-CM

## 2022-04-05 DIAGNOSIS — Z79899 Other long term (current) drug therapy: Secondary | ICD-10-CM

## 2022-04-05 MED ORDER — CLOPIDOGREL BISULFATE 75 MG PO TABS: 75.0000 mg | ORAL_TABLET | Freq: Every day | ORAL | 1 refills | Status: DC

## 2022-04-05 MED ORDER — FUROSEMIDE 20 MG PO TABS: 20.0000 mg | ORAL_TABLET | Freq: Every day | ORAL | 1 refills | Status: DC | PRN

## 2022-04-05 MED ORDER — ATORVASTATIN CALCIUM 80 MG PO TABS: 80.0000 mg | ORAL_TABLET | Freq: Every day | ORAL | 1 refills | Status: DC

## 2022-04-05 MED ORDER — LISINOPRIL 5 MG PO TABS: 5.0000 mg | ORAL_TABLET | Freq: Every day | ORAL | 6 refills | Status: DC

## 2022-04-05 MED ORDER — NICOTINE 21 MG/24HR TD PT24: 21.0000 mg | MEDICATED_PATCH | Freq: Every day | TRANSDERMAL | 1 refills | Status: DC

## 2022-04-05 MED ORDER — CARVEDILOL 6.25 MG PO TABS: 6.2500 mg | ORAL_TABLET | Freq: Two times a day (BID) | ORAL | 1 refills | Status: DC
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
## 2022-04-05 NOTE — Patient Instructions (Signed)
Medication Instructions:  Atorvastatin, Coreg, Plavix, Lasix, Lisinopril - refilled today.   Nicotine patch 21mg  - use as directed  Continue all other medications.     Labwork: CMET, CBC, TSH, FLP Reminder:  Nothing to eat or drink after 12 midnight prior to labs. Office will contact with results via phone, letter or mychart.     Testing/Procedures: none  Follow-Up: 6 months   Any Other Special Instructions Will Be Listed Below (If Applicable). You have been referred to Vascular  Smoking cessation info given today   If you need a refill on your cardiac medications before your next appointment, please call your pharmacy.

## 2022-04-15 ENCOUNTER — Other Ambulatory Visit: Payer: Self-pay | Admitting: *Deleted

## 2022-04-15 DIAGNOSIS — R109 Unspecified abdominal pain: Secondary | ICD-10-CM
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
## 2022-04-26 ENCOUNTER — Ambulatory Visit (HOSPITAL_COMMUNITY): Payer: Medicaid Other | Attending: Vascular Surgery

## 2022-04-26 ENCOUNTER — Encounter: Payer: Medicaid Other | Admitting: Vascular Surgery

## 2022-05-06 ENCOUNTER — Other Ambulatory Visit: Payer: Self-pay | Admitting: *Deleted

## 2022-05-06 DIAGNOSIS — Z9889 Other specified postprocedural states: Secondary | ICD-10-CM

## 2022-05-19 ENCOUNTER — Other Ambulatory Visit: Payer: Self-pay | Admitting: Vascular Surgery

## 2022-05-19 ENCOUNTER — Encounter: Payer: Self-pay | Admitting: Vascular Surgery

## 2022-05-19 ENCOUNTER — Ambulatory Visit (HOSPITAL_COMMUNITY)
Admission: RE | Admit: 2022-05-19 | Discharge: 2022-05-19 | Disposition: A | Discharge status: Home / Self Care | Payer: Medicaid Other | Source: Ambulatory Visit | Attending: Vascular Surgery | Admitting: Vascular Surgery

## 2022-05-19 ENCOUNTER — Ambulatory Visit (INDEPENDENT_AMBULATORY_CARE_PROVIDER_SITE_OTHER): Payer: Medicaid Other | Admitting: Vascular Surgery

## 2022-05-19 VITALS — BP 117/71 | HR 78 | Temp 98.6°F | Resp 20 | Ht 71.0 in | Wt 260.0 lb

## 2022-05-19 DIAGNOSIS — I71012 Dissection of descending thoracic aorta: Secondary | ICD-10-CM

## 2022-05-19 DIAGNOSIS — Z9889 Other specified postprocedural states: Secondary | ICD-10-CM
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
## 2022-10-25 ENCOUNTER — Ambulatory Visit: Payer: Medicaid Other | Admitting: Cardiology
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
## 2023-09-19 ENCOUNTER — Encounter (INDEPENDENT_AMBULATORY_CARE_PROVIDER_SITE_OTHER): Payer: Self-pay | Admitting: *Deleted

## 2023-10-28 ENCOUNTER — Other Ambulatory Visit: Payer: Self-pay

## 2023-10-28 ENCOUNTER — Emergency Department (HOSPITAL_COMMUNITY)

## 2023-10-28 ENCOUNTER — Inpatient Hospital Stay (HOSPITAL_COMMUNITY)
Admission: EM | Admit: 2023-10-28 | Discharge: 2023-11-10 | DRG: 231 | Disposition: A | Discharge status: Home / Self Care | Attending: Thoracic Surgery (Cardiothoracic Vascular Surgery) | Admitting: Thoracic Surgery (Cardiothoracic Vascular Surgery)

## 2023-10-28 ENCOUNTER — Encounter (HOSPITAL_COMMUNITY): Payer: Self-pay

## 2023-10-28 DIAGNOSIS — G8929 Other chronic pain: Secondary | ICD-10-CM | POA: Diagnosis present

## 2023-10-28 DIAGNOSIS — D62 Acute posthemorrhagic anemia: Secondary | ICD-10-CM | POA: Diagnosis present

## 2023-10-28 DIAGNOSIS — Z01810 Encounter for preprocedural cardiovascular examination: Secondary | ICD-10-CM | POA: Diagnosis not present

## 2023-10-28 DIAGNOSIS — I10 Essential (primary) hypertension: Secondary | ICD-10-CM | POA: Diagnosis not present

## 2023-10-28 DIAGNOSIS — Z7984 Long term (current) use of oral hypoglycemic drugs: Secondary | ICD-10-CM

## 2023-10-28 DIAGNOSIS — J9601 Acute respiratory failure with hypoxia: Secondary | ICD-10-CM | POA: Diagnosis present

## 2023-10-28 DIAGNOSIS — F17210 Nicotine dependence, cigarettes, uncomplicated: Secondary | ICD-10-CM | POA: Diagnosis present

## 2023-10-28 DIAGNOSIS — Z886 Allergy status to analgesic agent status: Secondary | ICD-10-CM | POA: Diagnosis not present

## 2023-10-28 DIAGNOSIS — Z91199 Patient's noncompliance with other medical treatment and regimen due to unspecified reason: Secondary | ICD-10-CM

## 2023-10-28 DIAGNOSIS — E66813 Obesity, class 3: Secondary | ICD-10-CM | POA: Diagnosis present

## 2023-10-28 DIAGNOSIS — I2510 Atherosclerotic heart disease of native coronary artery without angina pectoris: Secondary | ICD-10-CM | POA: Diagnosis present

## 2023-10-28 DIAGNOSIS — G8922 Chronic post-thoracotomy pain: Secondary | ICD-10-CM | POA: Diagnosis not present

## 2023-10-28 DIAGNOSIS — E785 Hyperlipidemia, unspecified: Secondary | ICD-10-CM | POA: Diagnosis present

## 2023-10-28 DIAGNOSIS — N179 Acute kidney failure, unspecified: Secondary | ICD-10-CM | POA: Diagnosis present

## 2023-10-28 DIAGNOSIS — Y831 Surgical operation with implant of artificial internal device as the cause of abnormal reaction of the patient, or of later complication, without mention of misadventure at the time of the procedure: Secondary | ICD-10-CM | POA: Diagnosis not present

## 2023-10-28 DIAGNOSIS — E119 Type 2 diabetes mellitus without complications: Secondary | ICD-10-CM | POA: Diagnosis present

## 2023-10-28 DIAGNOSIS — F1210 Cannabis abuse, uncomplicated: Secondary | ICD-10-CM | POA: Diagnosis present

## 2023-10-28 DIAGNOSIS — Z6837 Body mass index (BMI) 37.0-37.9, adult: Secondary | ICD-10-CM

## 2023-10-28 DIAGNOSIS — Z955 Presence of coronary angioplasty implant and graft: Secondary | ICD-10-CM | POA: Diagnosis not present

## 2023-10-28 DIAGNOSIS — Z8249 Family history of ischemic heart disease and other diseases of the circulatory system: Secondary | ICD-10-CM

## 2023-10-28 DIAGNOSIS — R5082 Postprocedural fever: Secondary | ICD-10-CM | POA: Diagnosis not present

## 2023-10-28 DIAGNOSIS — J95821 Acute postprocedural respiratory failure: Secondary | ICD-10-CM | POA: Diagnosis not present

## 2023-10-28 DIAGNOSIS — E871 Hypo-osmolality and hyponatremia: Secondary | ICD-10-CM | POA: Diagnosis present

## 2023-10-28 DIAGNOSIS — I252 Old myocardial infarction: Secondary | ICD-10-CM | POA: Diagnosis not present

## 2023-10-28 DIAGNOSIS — I493 Ventricular premature depolarization: Secondary | ICD-10-CM | POA: Diagnosis present

## 2023-10-28 DIAGNOSIS — J440 Chronic obstructive pulmonary disease with acute lower respiratory infection: Secondary | ICD-10-CM | POA: Diagnosis present

## 2023-10-28 DIAGNOSIS — I319 Disease of pericardium, unspecified: Secondary | ICD-10-CM | POA: Diagnosis present

## 2023-10-28 DIAGNOSIS — T82855A Stenosis of coronary artery stent, initial encounter: Secondary | ICD-10-CM | POA: Diagnosis not present

## 2023-10-28 DIAGNOSIS — R110 Nausea: Secondary | ICD-10-CM | POA: Diagnosis present

## 2023-10-28 DIAGNOSIS — Z9889 Other specified postprocedural states: Secondary | ICD-10-CM | POA: Diagnosis not present

## 2023-10-28 DIAGNOSIS — R079 Chest pain, unspecified: Secondary | ICD-10-CM | POA: Diagnosis not present

## 2023-10-28 DIAGNOSIS — Z951 Presence of aortocoronary bypass graft: Secondary | ICD-10-CM | POA: Diagnosis not present

## 2023-10-28 DIAGNOSIS — Z888 Allergy status to other drugs, medicaments and biological substances status: Secondary | ICD-10-CM

## 2023-10-28 DIAGNOSIS — I214 Non-ST elevation (NSTEMI) myocardial infarction: Secondary | ICD-10-CM | POA: Diagnosis present

## 2023-10-28 DIAGNOSIS — I110 Hypertensive heart disease with heart failure: Secondary | ICD-10-CM | POA: Diagnosis present

## 2023-10-28 DIAGNOSIS — I5022 Chronic systolic (congestive) heart failure: Secondary | ICD-10-CM | POA: Diagnosis present

## 2023-10-28 DIAGNOSIS — Z79899 Other long term (current) drug therapy: Secondary | ICD-10-CM

## 2023-10-28 DIAGNOSIS — Z7902 Long term (current) use of antithrombotics/antiplatelets: Secondary | ICD-10-CM

## 2023-10-28 DIAGNOSIS — Z716 Tobacco abuse counseling: Secondary | ICD-10-CM

## 2023-10-28 DIAGNOSIS — R000 Tachycardia, unspecified: Secondary | ICD-10-CM | POA: Diagnosis present

## 2023-10-28 DIAGNOSIS — I255 Ischemic cardiomyopathy: Secondary | ICD-10-CM | POA: Diagnosis present

## 2023-10-28 DIAGNOSIS — Z809 Family history of malignant neoplasm, unspecified: Secondary | ICD-10-CM

## 2023-10-28 DIAGNOSIS — J9811 Atelectasis: Secondary | ICD-10-CM | POA: Diagnosis present

## 2023-10-28 DIAGNOSIS — R519 Headache, unspecified: Secondary | ICD-10-CM | POA: Diagnosis present

## 2023-10-28 DIAGNOSIS — Z91048 Other nonmedicinal substance allergy status: Secondary | ICD-10-CM

## 2023-10-28 DIAGNOSIS — Z880 Allergy status to penicillin: Secondary | ICD-10-CM

## 2023-10-28 LAB — BASIC METABOLIC PANEL WITH GFR
Anion gap: 10 (ref 5–15)
BUN: 7 mg/dL (ref 6–20)
CO2: 23 mmol/L (ref 22–32)
Calcium: 8.5 mg/dL — ABNORMAL LOW (ref 8.9–10.3)
Chloride: 101 mmol/L (ref 98–111)
Creatinine, Ser: 0.78 mg/dL (ref 0.61–1.24)
GFR, Estimated: >60 mL/min (ref >60)
Glucose, Bld: 133 mg/dL — ABNORMAL HIGH (ref 70–99)
Potassium: 3.4 mmol/L — ABNORMAL LOW (ref 3.5–5.1)
Sodium: 134 mmol/L — ABNORMAL LOW (ref 135–145)

## 2023-10-28 LAB — CBC
HCT: 44.6 % (ref 39.0–52.0)
Hemoglobin: 14.9 g/dL (ref 13.0–17.0)
MCH: 31.9 pg (ref 26.0–34.0)
MCHC: 33.4 g/dL (ref 30.0–36.0)
MCV: 95.5 fL (ref 80.0–100.0)
Platelets: 226 10*3/uL (ref 150–400)
RBC: 4.67 MIL/uL (ref 4.22–5.81)
RDW: 12.9 % (ref 11.5–15.5)
WBC: 10.1 10*3/uL (ref 4.0–10.5)
nRBC: 0 % (ref 0.0–0.2)

## 2023-10-28 LAB — TROPONIN I (HIGH SENSITIVITY)
Troponin I (High Sensitivity): 878 ng/L (ref <18)
Troponin I (High Sensitivity): 923 ng/L (ref <18)

## 2023-10-28 LAB — PROTIME-INR
INR: 1 (ref 0.8–1.2)
Prothrombin Time: 13.3 s (ref 11.4–15.2)

## 2023-10-28 MED ORDER — HEPARIN (PORCINE) 25000 UT/250ML-% IV SOLN
2100.0000 [IU]/h | INTRAVENOUS | Status: DC
  Administered 2023-10-28: 1350 [IU]/h via INTRAVENOUS
  Administered 2023-10-29: 1550 [IU]/h via INTRAVENOUS
  Administered 2023-10-29: 1900 [IU]/h via INTRAVENOUS
  Administered 2023-10-30: 2100 [IU]/h via INTRAVENOUS
  Filled 2023-10-28 (×3): qty 250

## 2023-10-28 MED ORDER — HEPARIN BOLUS VIA INFUSION
4000.0000 [IU] | Freq: Once | INTRAVENOUS | Status: AC
  Administered 2023-10-28: 4000 [IU] via INTRAVENOUS

## 2023-10-28 MED ORDER — NITROGLYCERIN 2 % TD OINT
1.0000 [in_us] | TOPICAL_OINTMENT | Freq: Once | TRANSDERMAL | Status: AC
  Administered 2023-10-28: 1 [in_us] via TOPICAL
  Filled 2023-10-28: qty 1

## 2023-10-28 NOTE — ED Notes (Signed)
 ED Provider at bedside.

## 2023-10-28 NOTE — ED Provider Notes (Signed)
 [place] EMERGENCY DEPARTMENT AT [facility] Provider Note   CSN: [id number] Arrival date & time: 10/28/23  2012     Patient presents with: Chest Pain   Brian Caldwell is a 48 y.o. male.  He has a history of coronary disease and stents, descending aortic disruption.  Complaining of on and off chest pain since Thursday.  Has been taking nitro and needing a more frequently.  Has taken 7 nitro today.  Took 1 in the waiting room.  Currently 3 out of 10 chest pain.  No shortness of breath.  Some nausea.  He is not sure about diaphoresis as it has been hot.  Does smoke marijuana denies any cocaine.   The history is provided by the patient.  Chest Pain Pain location:  Substernal area Pain quality: aching and pressure   Pain radiates to:  Does not radiate Pain severity:  Moderate Onset quality:  Gradual Duration:  3 days Timing:  Intermittent Progression:  Unchanged Chronicity:  New Relieved by:  Nitroglycerin  Worsened by:  Nothing Ineffective treatments:  None tried Associated symptoms: nausea   Associated symptoms: no abdominal pain, no cough, no diaphoresis, no fever, no shortness of breath and no vomiting   Risk factors: coronary artery disease and smoking        Prior to Admission medications   Medication Sig Start Date End Date Taking? Authorizing Provider  acetaminophen  (TYLENOL ) 500 MG tablet Take 500 mg by mouth every 6 (six) hours as needed for mild pain.    [provider]  atorvastatin  (LIPITOR ) 80 MG tablet Take 1 tablet (80 mg total) by mouth daily. 04/05/22   Miriam Norris, NP  carvedilol  (COREG ) 6.25 MG tablet Take 1 tablet (6.25 mg total) by mouth 2 (two) times daily. 04/05/22   Miriam Norris, NP  clopidogrel  (PLAVIX ) 75 MG tablet Take 1 tablet (75 mg total) by mouth daily. 04/05/22   Miriam Norris, NP  furosemide  (LASIX ) 20 MG tablet Take 1 tablet (20 mg total) by mouth daily as needed. 04/05/22   Miriam Norris, NP  ibuprofen  (ADVIL ) 200  MG tablet Take 400 mg by mouth as needed.    [provider]  lisinopril  (ZESTRIL ) 5 MG tablet Take 1 tablet (5 mg total) by mouth daily. 04/05/22   Miriam Norris, NP  nicotine  (NICODERM CQ  - DOSED IN MG/24 HOURS) 21 mg/24hr patch Place 1 patch (21 mg total) onto the skin daily. 04/05/22   Miriam Norris, NP  nitroGLYCERIN  (NITROSTAT ) 0.4 MG SL tablet PLACE ONE (1) TABLET UNDER TONGUE EVERY 5 MINUTES UP TO (3) DOSES AS NEEDED FOR CHEST PAIN. 12/17/21   Johnson, Laymon HERO, PA-C    Allergies: Aspirin , Penicillins, Chantix  [varenicline ], Other, and Tape    Review of Systems  Constitutional:  Negative for diaphoresis and fever.  Respiratory:  Negative for cough and shortness of breath.   Cardiovascular:  Positive for chest pain.  Gastrointestinal:  Positive for nausea. Negative for abdominal pain and vomiting.    Updated Vital Signs BP 129/85 (BP Location: Right Arm)   Pulse 100   Temp 98.2 F (36.8 C)   Resp 17   SpO2 97%   Physical Exam Vitals and nursing note reviewed.  Constitutional:      General: He is not in acute distress.    Appearance: He is well-developed.  HENT:     Head: Normocephalic and atraumatic.   Eyes:     Conjunctiva/sclera: Conjunctivae normal.    Cardiovascular:  Rate and Rhythm: Normal rate and regular rhythm.     Heart sounds: Normal heart sounds. No murmur heard. Pulmonary:     Effort: Pulmonary effort is normal. No respiratory distress.     Breath sounds: Normal breath sounds.  Abdominal:     Palpations: Abdomen is soft.     Tenderness: There is no abdominal tenderness.   Musculoskeletal:        General: No swelling.     Cervical back: Neck supple.     Right lower leg: No tenderness. No edema.     Left lower leg: No tenderness. No edema.   Skin:    General: Skin is warm and dry.     Capillary Refill: Capillary refill takes less than 2 seconds.   Neurological:     General: No focal deficit present.     Mental Status: He is  alert.     (all labs ordered are listed, but only abnormal results are displayed) Labs Reviewed  BASIC METABOLIC PANEL WITH GFR - Abnormal; Notable for the following components:      Result Value   Sodium 134 (*)    Potassium 3.4 (*)    Glucose, Bld 133 (*)    Calcium  8.5 (*)    All other components within normal limits  TROPONIN I (HIGH SENSITIVITY) - Abnormal; Notable for the following components:   Troponin I (High Sensitivity) 878 (*)    All other components within normal limits  CBC  PROTIME-INR  HEPARIN  LEVEL (UNFRACTIONATED)  CBC  TROPONIN I (HIGH SENSITIVITY)    EKG: EKG Interpretation Date/Time:  Saturday October 28 2023 20:36:25 EDT Ventricular Rate:  97 PR Interval:  165 QRS Duration:  97 QT Interval:  350 QTC Calculation: 445 R Axis:   101  Text Interpretation: Sinus rhythm Consider left atrial enlargement Probable anteroseptal infarct, recent Abnormal T, consider ischemia, inferior leads new inferior ischemia and q waves anterior compared with prior 2/21 Confirmed by Towana Sharper [phone]) on 10/28/2023 8:42:44 PM  Radiology: [facility] Chest 2 View Result Date: 10/28/2023 CLINICAL DATA:  Chest pain. EXAM: CHEST - 2 VIEW COMPARISON:  06/10/2019, radiographs and CT FINDINGS: The heart is normal in size. Stable mediastinal contours. Suspected prior aortic repair. Scarring in the left lung. No acute airspace disease, pleural effusion, or pneumothorax. No pulmonary edema. Remote left rib fractures. IMPRESSION: 1. No acute chest findings. 2. Scarring in the left lung. Electronically Signed   By: Andrea Gasman M.D.   On: 10/28/2023 21:17     .Critical Care  Performed by: Towana Sharper BROCKS, MD Authorized by: Towana Sharper BROCKS, MD   Critical care provider statement:    Critical care time (minutes):  45   Critical care time was exclusive of:  Separately billable procedures and treating other patients   Critical care was necessary to treat or prevent imminent or  life-threatening deterioration of the following conditions:  Cardiac failure   Critical care was time spent personally by me on the following activities:  Development of treatment plan with patient or surrogate, discussions with consultants, evaluation of patient's response to treatment, examination of patient, obtaining history from patient or surrogate, ordering and performing treatments and interventions, ordering and review of laboratory studies, ordering and review of radiographic studies, pulse oximetry, re-evaluation of patient's condition and review of old charts   I assumed direction of critical care for this patient from another provider in my specialty: no      Medications Ordered in the ED  heparin   ADULT infusion 100 units/mL (25000 units/250mL) (1,350 Units/hr Intravenous New Bag/Given 10/28/23 2117)  nitroGLYCERIN  (NITROGLYN) 2 % ointment 1 inch (1 inch Topical Given 10/28/23 2056)  heparin  bolus via infusion 4,000 Units (4,000 Units Intravenous Bolus from Bag 10/28/23 2117)    Clinical Course as of 10/28/23 2230  Sat Oct 28, 2023  2121 Chest x-ray interpreted by me as no acute findings.  Awaiting radiology reading. [MB]  2145 Discussed with cardiology Dr. Donnel.  He asked that the patient be transferred to [facility] for admission cardiac telemetry.  He agrees with current management of heparin  and Nitropaste. [MB]  2230 Patient updated on plan for transfer to [facility] for cardiology admission and he is in agreement with plan.  He will let us  know if he has any recurrence of chest pain [MB]    Clinical Course User Index [MB] Towana Ozell BROCKS, MD                                 Medical Decision Making Amount and/or Complexity of Data Reviewed Labs: ordered. Radiology: ordered.  Risk Prescription drug management. Decision regarding hospitalization.   This patient complains of substernal chest pain; this involves an extensive number of treatment Options and is a complaint that  carries with it a high risk of complications and morbidity. The differential includes ACS, GERD, musculoskeletal, PE, vascular  I ordered, reviewed and interpreted labs, which included CBC unremarkable, chemistries with mildly low potassium elevated glucose, troponins elevated need to be trended I ordered medication topical nitro and IV heparin  and reviewed PMP when indicated. I ordered imaging studies which included chest x-ray and I independently    visualized and interpreted imaging which showed no acute findings Previous records obtained and reviewed in epic including prior cardiology notes and vascular notes I consulted cardiology Dr. Winfred and discussed lab and imaging findings and discussed disposition.  Cardiac monitoring reviewed, sinus rhythm Social determinants considered, tobacco use Critical Interventions: Initiation of IV heparin  for NSTEMI  After the interventions stated above, I reevaluated the patient and found patient's pain to be improved Admission and further testing considered, he will need further evaluation by cardiology and admission to [facility] campus.  Patient in agreement with plan for admission.      Final diagnoses:  NSTEMI (non-ST elevated myocardial infarction) [facility])    ED Discharge Orders     None          Towana Ozell BROCKS, MD 10/28/23 2232
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
## 2023-10-28 NOTE — ED Notes (Signed)
 Patient transported to X-ray

## 2023-10-28 NOTE — ED Triage Notes (Signed)
 Pt reports:  Chest pain Getting worse Took Nitroglycerin  Last at 2005 Took 6 throughout the day
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
## 2023-10-29 DIAGNOSIS — I214 Non-ST elevation (NSTEMI) myocardial infarction: Secondary | ICD-10-CM

## 2023-10-29 LAB — LIPID PANEL
Cholesterol: 101 mg/dL (ref 0–200)
HDL: 23 mg/dL — ABNORMAL LOW (ref >40)
LDL Cholesterol: 51 mg/dL (ref 0–99)
Total CHOL/HDL Ratio: 4.4 ratio
Triglycerides: 133 mg/dL (ref <150)
VLDL: 27 mg/dL (ref 0–40)

## 2023-10-29 LAB — HEPARIN LEVEL (UNFRACTIONATED)
Heparin Unfractionated: <0.1 [IU]/mL — ABNORMAL LOW (ref 0.30–0.70)
Heparin Unfractionated: <0.1 [IU]/mL — ABNORMAL LOW (ref 0.30–0.70)
Heparin Unfractionated: 0.1 [IU]/mL — ABNORMAL LOW (ref 0.30–0.70)

## 2023-10-29 LAB — CBC
HCT: 45.1 % (ref 39.0–52.0)
Hemoglobin: 15.4 g/dL (ref 13.0–17.0)
MCH: 32.2 pg (ref 26.0–34.0)
MCHC: 34.1 g/dL (ref 30.0–36.0)
MCV: 94.2 fL (ref 80.0–100.0)
Platelets: 232 10*3/uL (ref 150–400)
RBC: 4.79 MIL/uL (ref 4.22–5.81)
RDW: 13 % (ref 11.5–15.5)
WBC: 11.5 10*3/uL — ABNORMAL HIGH (ref 4.0–10.5)
nRBC: 0 % (ref 0.0–0.2)

## 2023-10-29 LAB — BASIC METABOLIC PANEL WITH GFR
Anion gap: 8 (ref 5–15)
BUN: 6 mg/dL (ref 6–20)
CO2: 24 mmol/L (ref 22–32)
Calcium: 8.8 mg/dL — ABNORMAL LOW (ref 8.9–10.3)
Chloride: 104 mmol/L (ref 98–111)
Creatinine, Ser: 0.83 mg/dL (ref 0.61–1.24)
GFR, Estimated: >60 mL/min (ref >60)
Glucose, Bld: 102 mg/dL — ABNORMAL HIGH (ref 70–99)
Potassium: 3.5 mmol/L (ref 3.5–5.1)
Sodium: 136 mmol/L (ref 135–145)

## 2023-10-29 LAB — HIV ANTIBODY (ROUTINE TESTING W REFLEX): HIV Screen 4th Generation wRfx: NONREACTIVE

## 2023-10-29 LAB — TROPONIN I (HIGH SENSITIVITY)
Troponin I (High Sensitivity): 971 ng/L (ref <18)
Troponin I (High Sensitivity): 1027 ng/L (ref <18)

## 2023-10-29 MED ORDER — HEPARIN BOLUS VIA INFUSION
3000.0000 [IU] | Freq: Once | INTRAVENOUS | Status: DC
  Filled 2023-10-29: qty 3000

## 2023-10-29 MED ORDER — CLOPIDOGREL BISULFATE 75 MG PO TABS
75.0000 mg | ORAL_TABLET | Freq: Every day | ORAL | Status: DC
  Administered 2023-10-29 – 2023-10-30 (×2): 75 mg via ORAL
  Filled 2023-10-29 (×2): qty 1

## 2023-10-29 MED ORDER — ATORVASTATIN CALCIUM 80 MG PO TABS
80.0000 mg | ORAL_TABLET | Freq: Every day | ORAL | Status: DC
  Administered 2023-10-29 – 2023-11-01 (×4): 80 mg via ORAL
  Filled 2023-10-29 (×4): qty 1

## 2023-10-29 MED ORDER — NICOTINE 21 MG/24HR TD PT24
21.0000 mg | MEDICATED_PATCH | Freq: Every day | TRANSDERMAL | Status: DC
  Administered 2023-10-29 – 2023-11-01 (×4): 21 mg via TRANSDERMAL
  Filled 2023-10-29 (×4): qty 1

## 2023-10-29 MED ORDER — HEPARIN BOLUS VIA INFUSION
3000.0000 [IU] | Freq: Once | INTRAVENOUS | Status: AC
  Administered 2023-10-29: 3000 [IU] via INTRAVENOUS
  Filled 2023-10-29: qty 3000

## 2023-10-29 MED ORDER — NITROGLYCERIN 0.4 MG SL SUBL
0.4000 mg | SUBLINGUAL_TABLET | SUBLINGUAL | Status: DC | PRN
Start: 2023-10-29 — End: 2023-11-02

## 2023-10-29 MED ORDER — HEPARIN BOLUS VIA INFUSION
4000.0000 [IU] | Freq: Once | INTRAVENOUS | Status: AC
  Administered 2023-10-29: 4000 [IU] via INTRAVENOUS
  Filled 2023-10-29: qty 4000

## 2023-10-29 MED ORDER — CARVEDILOL 6.25 MG PO TABS: 6.2500 mg | ORAL_TABLET | Freq: Two times a day (BID) | ORAL | Status: DC

## 2023-10-29 MED ORDER — CARVEDILOL 6.25 MG PO TABS
6.2500 mg | ORAL_TABLET | Freq: Two times a day (BID) | ORAL | Status: DC
  Filled 2023-10-29: qty 1

## 2023-10-29 MED ORDER — CLOPIDOGREL BISULFATE 75 MG PO TABS: 75.0000 mg | ORAL_TABLET | Freq: Every day | ORAL | Status: DC

## 2023-10-29 MED ORDER — ALPRAZOLAM 0.5 MG PO TABS
0.5000 mg | ORAL_TABLET | Freq: Once | ORAL | Status: AC
  Administered 2023-10-29: 0.5 mg via ORAL
  Filled 2023-10-29: qty 1

## 2023-10-29 MED ORDER — NITROGLYCERIN IN D5W 200-5 MCG/ML-% IV SOLN
5.0000 ug/min | INTRAVENOUS | Status: DC
  Administered 2023-10-29: 5 ug/min via INTRAVENOUS
  Administered 2023-10-30: 30 ug/min via INTRAVENOUS
  Administered 2023-10-30: 100 ug/min via INTRAVENOUS
  Filled 2023-10-29 (×3): qty 250

## 2023-10-29 MED ORDER — ONDANSETRON HCL 4 MG/2ML IJ SOLN: 4.0000 mg | Freq: Four times a day (QID) | INTRAMUSCULAR | Status: DC | PRN

## 2023-10-29 MED ORDER — CLOPIDOGREL BISULFATE 75 MG PO TABS: 75.0000 mg | ORAL_TABLET | ORAL | Status: DC

## 2023-10-29 MED ORDER — SODIUM CHLORIDE 0.9 % IV SOLN: INTRAVENOUS | Status: DC

## 2023-10-29 MED ORDER — ACETAMINOPHEN 325 MG PO TABS
650.0000 mg | ORAL_TABLET | ORAL | Status: DC | PRN
  Administered 2023-10-29 – 2023-10-31 (×7): 650 mg via ORAL
  Filled 2023-10-29 (×7): qty 2

## 2023-10-29 MED ORDER — LISINOPRIL 5 MG PO TABS
5.0000 mg | ORAL_TABLET | Freq: Every day | ORAL | Status: DC
Start: 2023-10-29 — End: 2023-10-29
  Filled 2023-10-29: qty 1
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
## 2023-10-29 NOTE — ED Notes (Signed)
 Called c-link at this time. Brian Caldwell

## 2023-10-29 NOTE — H&P (View-Only) (Signed)
   Patient admitted 6 AM by cardiology fellow.     1.NSTEMI/CAD - STEMI 2013 status post bare-metal stent LAD - NSTEMI 2016 status post LCx and OM stent, PTCA of jailed LCx, RCA CTO with left-to-right collaterals  - cath 2021 PTCA to Lcx (ISR) and stent to LAD   - trop 971 without peak, repeat pending - EKG ST depression inferior leads, Qwaves V1-V4 that are new - initial chest pain was Thursday, based on EKG may be late presenting infarction - was not taking his meds at home, only taking atorvastatin . Smokes 2-3 packs per day - echo pending  - medical therapy with atorva 80, coreg  6.25mg  bid, plavix  75, hep gtt, lisinopril  5mg , nitroglycerin  gtt - SBP high 90s on NG gtt, we will hold his coreg  and lisinopril  -Has ASA allergy, historically after prior PCI has been managed with either effient  or brillinta monotherapy.  - chest pain free, will give a diet today and set up for cath tomorrow AM.  Informed Consent   Shared Decision Making/Informed Consent The risks [stroke (1 in 1000), death (1 in 1000), kidney failure [usually temporary] (1 in 500), bleeding (1 in 200), allergic reaction [possibly serious] (1 in 200)], benefits (diagnostic support and management of coronary artery disease) and alternatives of a cardiac catheterization were discussed in detail with Brian Caldwell and he is willing to proceed.      Dorn Ross MD
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
## 2023-10-29 NOTE — Plan of Care (Signed)

## 2023-10-29 NOTE — ED Notes (Signed)
 Pt ambulated to bathroom to void. Pt c/o CP increasing to 6/10 with worsening SHOB while ambulating. EKG obtained and given to provider. See new orders.

## 2023-10-29 NOTE — ED Notes (Signed)
 Update given to [facility] SE RN regarding initiating nitroglyceryn gtt and pt being transported at this time.

## 2023-10-29 NOTE — Progress Notes (Addendum)
   Patient admitted 6 AM by cardiology fellow.     1.NSTEMI/CAD - STEMI 2013 status post bare-metal stent LAD - NSTEMI 2016 status post LCx and OM stent, PTCA of jailed LCx, RCA CTO with left-to-right collaterals  - cath 2021 PTCA to Lcx (ISR) and stent to LAD   - trop 971 without peak, repeat pending - EKG ST depression inferior leads, Qwaves V1-V4 that are new - initial chest pain was Thursday, based on EKG may be late presenting infarction - was not taking his meds at home, only taking atorvastatin . Smokes 2-3 packs per day - echo pending  - medical therapy with atorva 80, coreg  6.25mg  bid, plavix  75, hep gtt, lisinopril  5mg , nitroglycerin  gtt - SBP high 90s on NG gtt, we will hold his coreg  and lisinopril  -Has ASA allergy, historically after prior PCI has been managed with either effient  or brillinta monotherapy.  - chest pain free, will give a diet today and set up for cath tomorrow AM.  Informed Consent   Shared Decision Making/Informed Consent The risks [stroke (1 in 1000), death (1 in 1000), kidney failure [usually temporary] (1 in 500), bleeding (1 in 200), allergic reaction [possibly serious] (1 in 200)], benefits (diagnostic support and management of coronary artery disease) and alternatives of a cardiac catheterization were discussed in detail with Mr. Cambridge and he is willing to proceed.      Dorn Ross MD

## 2023-10-29 NOTE — ED Provider Notes (Signed)
 2:31 AM  Patient in [facility] awaiting bed at [facility] for NSTEMI, reported increasing pain. EKG unchanged. Will give NTG infusion (previously had paste) and repeat Trop.    Roselyn Carlin NOVAK, MD 10/29/23 [phone]
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
## 2023-10-29 NOTE — Progress Notes (Signed)
 Notified Cards Fellow pt has arrived from [facility]

## 2023-10-30 ENCOUNTER — Encounter (HOSPITAL_COMMUNITY)
Admission: EM | Disposition: A | Discharge status: Home / Self Care | Payer: Self-pay | Source: Home / Self Care | Attending: Thoracic Surgery (Cardiothoracic Vascular Surgery)

## 2023-10-30 ENCOUNTER — Inpatient Hospital Stay (HOSPITAL_COMMUNITY)

## 2023-10-30 ENCOUNTER — Other Ambulatory Visit (HOSPITAL_COMMUNITY)

## 2023-10-30 DIAGNOSIS — I10 Essential (primary) hypertension: Secondary | ICD-10-CM

## 2023-10-30 DIAGNOSIS — I2510 Atherosclerotic heart disease of native coronary artery without angina pectoris: Secondary | ICD-10-CM

## 2023-10-30 DIAGNOSIS — R079 Chest pain, unspecified: Secondary | ICD-10-CM

## 2023-10-30 DIAGNOSIS — Z9889 Other specified postprocedural states: Secondary | ICD-10-CM

## 2023-10-30 DIAGNOSIS — Z886 Allergy status to analgesic agent status: Secondary | ICD-10-CM

## 2023-10-30 DIAGNOSIS — F17210 Nicotine dependence, cigarettes, uncomplicated: Secondary | ICD-10-CM

## 2023-10-30 DIAGNOSIS — Z955 Presence of coronary angioplasty implant and graft: Secondary | ICD-10-CM

## 2023-10-30 DIAGNOSIS — G8922 Chronic post-thoracotomy pain: Secondary | ICD-10-CM

## 2023-10-30 DIAGNOSIS — I214 Non-ST elevation (NSTEMI) myocardial infarction: Secondary | ICD-10-CM

## 2023-10-30 DIAGNOSIS — E785 Hyperlipidemia, unspecified: Secondary | ICD-10-CM

## 2023-10-30 HISTORY — PX: CORONARY ULTRASOUND/IVUS: CATH118244

## 2023-10-30 HISTORY — PX: CORONARY BALLOON ANGIOPLASTY: CATH118233

## 2023-10-30 HISTORY — PX: LEFT HEART CATH AND CORONARY ANGIOGRAPHY: CATH118249

## 2023-10-30 LAB — BASIC METABOLIC PANEL WITH GFR
Anion gap: 8 (ref 5–15)
BUN: 7 mg/dL (ref 6–20)
CO2: 25 mmol/L (ref 22–32)
Calcium: 8.9 mg/dL (ref 8.9–10.3)
Chloride: 108 mmol/L (ref 98–111)
Creatinine, Ser: 0.75 mg/dL (ref 0.61–1.24)
GFR, Estimated: >60 mL/min
Glucose, Bld: 105 mg/dL — ABNORMAL HIGH (ref 70–99)
Potassium: 3.8 mmol/L (ref 3.5–5.1)
Sodium: 141 mmol/L (ref 135–145)

## 2023-10-30 LAB — ECHOCARDIOGRAM COMPLETE
Area-P 1/2: 4.26 cm2
Calc EF: 39.2 %
Height: 72 in
S' Lateral: 4.2 cm
Single Plane A2C EF: 44.3 %
Single Plane A4C EF: 37.3 %
Weight: 4308.8 [oz_av]

## 2023-10-30 LAB — TROPONIN I (HIGH SENSITIVITY)
Troponin I (High Sensitivity): 712 ng/L (ref <18)
Troponin I (High Sensitivity): 1650 ng/L (ref <18)

## 2023-10-30 LAB — CBC
HCT: 44.3 % (ref 39.0–52.0)
Hemoglobin: 15.4 g/dL (ref 13.0–17.0)
MCH: 32.7 pg (ref 26.0–34.0)
MCHC: 34.8 g/dL (ref 30.0–36.0)
MCV: 94.1 fL (ref 80.0–100.0)
Platelets: 242 10*3/uL (ref 150–400)
RBC: 4.71 MIL/uL (ref 4.22–5.81)
RDW: 12.8 % (ref 11.5–15.5)
WBC: 11.5 10*3/uL — ABNORMAL HIGH (ref 4.0–10.5)
nRBC: 0 % (ref 0.0–0.2)

## 2023-10-30 LAB — POCT ACTIVATED CLOTTING TIME
Activated Clotting Time: 251 s
Activated Clotting Time: 325 s

## 2023-10-30 LAB — HEPARIN LEVEL (UNFRACTIONATED): Heparin Unfractionated: 0.18 [IU]/mL — ABNORMAL LOW (ref 0.30–0.70)

## 2023-10-30 LAB — SURGICAL PCR SCREEN
MRSA, PCR: NEGATIVE
Staphylococcus aureus: POSITIVE — AB

## 2023-10-30 LAB — LIPOPROTEIN A (LPA): Lipoprotein (a): 20.7 nmol/L (ref <75.0)

## 2023-10-30 SURGERY — LEFT HEART CATH AND CORONARY ANGIOGRAPHY
Anesthesia: LOCAL

## 2023-10-30 MED ORDER — NITROGLYCERIN 1 MG/10 ML FOR IR/CATH LAB
INTRA_ARTERIAL | Status: AC
  Filled 2023-10-30: qty 10

## 2023-10-30 MED ORDER — LIDOCAINE HCL (PF) 1 % IJ SOLN
INTRAMUSCULAR | Status: DC | PRN
  Administered 2023-10-30: 2 mL

## 2023-10-30 MED ORDER — SODIUM CHLORIDE 0.9% FLUSH
3.0000 mL | Freq: Two times a day (BID) | INTRAVENOUS | Status: DC
  Administered 2023-10-30 – 2023-11-01 (×6): 3 mL via INTRAVENOUS

## 2023-10-30 MED ORDER — METOPROLOL TARTRATE 5 MG/5ML IV SOLN
INTRAVENOUS | Status: AC
  Filled 2023-10-30: qty 5

## 2023-10-30 MED ORDER — HEPARIN SODIUM (PORCINE) 1000 UNIT/ML IJ SOLN
INTRAMUSCULAR | Status: DC | PRN
  Administered 2023-10-30: 2000 [IU] via INTRAVENOUS
  Administered 2023-10-30 (×2): 5000 [IU] via INTRAVENOUS

## 2023-10-30 MED ORDER — METOPROLOL TARTRATE 5 MG/5ML IV SOLN
2.5000 mg | Freq: Once | INTRAVENOUS | Status: AC
  Administered 2023-10-30: 2.5 mg via INTRAVENOUS

## 2023-10-30 MED ORDER — HYDRALAZINE HCL 20 MG/ML IJ SOLN
10.0000 mg | INTRAMUSCULAR | Status: AC | PRN
Start: 2023-10-30 — End: 2023-10-30

## 2023-10-30 MED ORDER — ALPRAZOLAM 0.5 MG PO TABS
0.5000 mg | ORAL_TABLET | Freq: Two times a day (BID) | ORAL | Status: DC | PRN
  Administered 2023-10-30 – 2023-11-01 (×5): 0.5 mg via ORAL
  Filled 2023-10-30 (×5): qty 1

## 2023-10-30 MED ORDER — HEPARIN (PORCINE) IN NACL 1000-0.9 UT/500ML-% IV SOLN
INTRAVENOUS | Status: DC | PRN
  Administered 2023-10-30 (×3): 500 mL

## 2023-10-30 MED ORDER — NITROGLYCERIN 1 MG/10 ML FOR IR/CATH LAB
INTRA_ARTERIAL | Status: DC | PRN
  Administered 2023-10-30 (×2): 100 ug via INTRACORONARY

## 2023-10-30 MED ORDER — MUPIROCIN 2 % EX OINT: 1.0000 | TOPICAL_OINTMENT | Freq: Two times a day (BID) | CUTANEOUS | Status: DC

## 2023-10-30 MED ORDER — SODIUM CHLORIDE 0.9% FLUSH: 3.0000 mL | INTRAVENOUS | Status: DC | PRN

## 2023-10-30 MED ORDER — TIROFIBAN HCL IN NACL 5-0.9 MG/100ML-% IV SOLN
0.1500 ug/kg/min | INTRAVENOUS | Status: DC
  Administered 2023-10-30 – 2023-10-31 (×4): 0.15 ug/kg/min via INTRAVENOUS
  Filled 2023-10-30 (×4): qty 100

## 2023-10-30 MED ORDER — IOHEXOL 350 MG/ML SOLN
INTRAVENOUS | Status: DC | PRN
Start: 2023-10-30 — End: 2023-10-30
  Administered 2023-10-30: 155 mL

## 2023-10-30 MED ORDER — MORPHINE SULFATE (PF) 2 MG/ML IV SOLN
2.0000 mg | INTRAVENOUS | Status: AC
  Administered 2023-10-30: 2 mg via INTRAVENOUS
  Filled 2023-10-30: qty 1

## 2023-10-30 MED ORDER — TIROFIBAN (AGGRASTAT) BOLUS VIA INFUSION
INTRAVENOUS | Status: DC | PRN
Start: 2023-10-30 — End: 2023-10-30
  Administered 2023-10-30: 3055 ug via INTRAVENOUS
  Administered 2023-10-30: 18.33 ug via INTRAVENOUS

## 2023-10-30 MED ORDER — FENTANYL CITRATE (PF) 100 MCG/2ML IJ SOLN
INTRAMUSCULAR | Status: AC
  Filled 2023-10-30: qty 2

## 2023-10-30 MED ORDER — MIDAZOLAM HCL 2 MG/2ML IJ SOLN
INTRAMUSCULAR | Status: AC
Start: 2023-10-30 — End: 2023-10-30
  Filled 2023-10-30: qty 2

## 2023-10-30 MED ORDER — CHLORHEXIDINE GLUCONATE CLOTH 2 % EX PADS
6.0000 | MEDICATED_PAD | Freq: Every day | CUTANEOUS | Status: DC
Start: 2023-10-30 — End: 2023-11-02
  Administered 2023-10-30 – 2023-10-31 (×2): 6 via TOPICAL

## 2023-10-30 MED ORDER — VERAPAMIL HCL 2.5 MG/ML IV SOLN
INTRAVENOUS | Status: DC | PRN
  Administered 2023-10-30: 10 mL via INTRA_ARTERIAL

## 2023-10-30 MED ORDER — HEPARIN SODIUM (PORCINE) 1000 UNIT/ML IJ SOLN
INTRAMUSCULAR | Status: AC
  Filled 2023-10-30: qty 10

## 2023-10-30 MED ORDER — VERAPAMIL HCL 2.5 MG/ML IV SOLN
INTRAVENOUS | Status: AC
Start: 2023-10-30 — End: 2023-10-30
  Filled 2023-10-30: qty 2

## 2023-10-30 MED ORDER — SODIUM CHLORIDE 0.9 % IV SOLN
250.0000 mL | INTRAVENOUS | Status: AC | PRN
  Administered 2023-10-30: 250 mL via INTRAVENOUS

## 2023-10-30 MED ORDER — MIDAZOLAM HCL 2 MG/2ML IJ SOLN
INTRAMUSCULAR | Status: DC | PRN
Start: 2023-10-30 — End: 2023-10-30
  Administered 2023-10-30 (×2): 1 mg via INTRAVENOUS

## 2023-10-30 MED ORDER — LIDOCAINE HCL (PF) 1 % IJ SOLN
INTRAMUSCULAR | Status: AC
  Filled 2023-10-30: qty 30

## 2023-10-30 MED ORDER — HEPARIN BOLUS VIA INFUSION
2000.0000 [IU] | Freq: Once | INTRAVENOUS | Status: AC
  Administered 2023-10-30: 2000 [IU] via INTRAVENOUS
  Filled 2023-10-30: qty 2000

## 2023-10-30 MED ORDER — TIROFIBAN HCL IN NACL 5-0.9 MG/100ML-% IV SOLN
INTRAVENOUS | Status: AC
  Filled 2023-10-30: qty 100

## 2023-10-30 MED ORDER — FENTANYL CITRATE (PF) 100 MCG/2ML IJ SOLN
INTRAMUSCULAR | Status: DC | PRN
  Administered 2023-10-30 (×3): 25 ug via INTRAVENOUS

## 2023-10-30 SURGICAL SUPPLY — 14 items
BALLOON EMERGE MR 2.0X15 (BALLOONS) IMPLANT
BALLOON EMERGE MR 3.0X12 (BALLOONS) IMPLANT
CATH 5FR JL3.5 JR4 ANG PIG MP (CATHETERS) IMPLANT
CATH OPTICROSS HD (CATHETERS) IMPLANT
CATH VISTA GUIDE 6FR XBLD 3.5 (CATHETERS) IMPLANT
DEVICE RAD COMP TR BAND LRG (VASCULAR PRODUCTS) IMPLANT
DRAPE IVUS SLED (BAG) IMPLANT
ELECT DEFIB PAD ADLT CADENCE (PAD) IMPLANT
GLIDESHEATH SLEND SS 6F .021 (SHEATH) IMPLANT
GUIDEWIRE INQWIRE 1.5J.035X260 (WIRE) IMPLANT
KIT ENCORE 26 ADVANTAGE (KITS) IMPLANT
PACK CARDIAC CATHETERIZATION (CUSTOM PROCEDURE TRAY) ×2 IMPLANT
SET ATX-X65L (MISCELLANEOUS) IMPLANT
WIRE RUNTHROUGH .014X180CM (WIRE) IMPLANT

## 2023-10-30 NOTE — Plan of Care (Signed)
  Problem: Education: Goal: Knowledge of General Education information will improve Description: Including pain rating scale, medication(s)/side effects and non-pharmacologic comfort measures Outcome: Progressing   Problem: Activity: Goal: Risk for activity intolerance will decrease Outcome: Progressing   Problem: Elimination: Goal: Will not experience complications related to bowel motility Outcome: Progressing   Problem: Education: Goal: Understanding of cardiac disease, CV risk reduction, and recovery process will improve Outcome: Progressing   Problem: Cardiac: Goal: Ability to achieve and maintain adequate cardiovascular perfusion will improve Outcome: Progressing   Problem: Cardiovascular: Goal: Ability to achieve and maintain adequate cardiovascular perfusion will improve Outcome: Progressing

## 2023-10-30 NOTE — Progress Notes (Addendum)
 I was called urgently to evaluate this patient with worsening chest pain.  Patient is admitted with Q wave MI, possibly late presentation/NSTEMI with troponin peaking 1000 -> During the day his chest pain had improved on nitroglycerin  thus plan was for cardiac cath tomorrow. However chest pain reoccurred this evening with increasing doses of nitroglycerin , when I came to see him his nitroglycerin  was at 75 mics.  > EKG obtained shows increased ST changes in anterior leads with Q waves in anterior leads.  The ST segment changes/elevations  that are not that different from EKG at 2:30 AM on 10/29/2023 (was around 1-1.5), Now,  measured it is <12mm in V2-V3 leads today ST segment . But the patient is tachycardic and HTNsive now as well  Plan: -> increase NTG to 193mcg-> getting up. Patient is hypertensive and is tachycardic,  gave him total of 5 mg metoprolol  -> 2 mg morphine . Stat troponin ordered  Pain improved to 3-4 from 7 out of 10 before.  If patient's chest pain does not improve with call Cath Lab for emergent cath  Grayce Bold

## 2023-10-30 NOTE — Progress Notes (Signed)
  Echocardiogram 2D Echocardiogram has been performed.  Tinnie FORBES Gosling RDCS 10/30/2023, 11:34 AM
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
## 2023-10-30 NOTE — Plan of Care (Signed)

## 2023-10-30 NOTE — Interval H&P Note (Signed)
 History and Physical Interval Note:  10/30/2023 7:32 AM  Brian Caldwell  has presented today for surgery, with the diagnosis of NSTEMI.  The various methods of treatment have been discussed with the patient and family. After consideration of risks, benefits and other options for treatment, the patient has consented to  Procedure(s): LEFT HEART CATH AND CORONARY ANGIOGRAPHY (N/A) as a surgical intervention.  The patient's history has been reviewed, patient examined, no change in status, stable for surgery.  I have reviewed the patient's chart and labs.  Questions were answered to the patient's satisfaction.    Cath Lab Visit (complete for each Cath Lab visit)  Clinical Evaluation Leading to the Procedure:   ACS: Yes.    Non-ACS:  N/A  Derrika Ruffalo
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
## 2023-10-31 ENCOUNTER — Inpatient Hospital Stay (HOSPITAL_COMMUNITY)

## 2023-10-31 ENCOUNTER — Encounter (HOSPITAL_COMMUNITY): Payer: Self-pay | Admitting: Internal Medicine

## 2023-10-31 DIAGNOSIS — Z01810 Encounter for preprocedural cardiovascular examination: Secondary | ICD-10-CM

## 2023-10-31 DIAGNOSIS — I214 Non-ST elevation (NSTEMI) myocardial infarction: Secondary | ICD-10-CM | POA: Diagnosis not present

## 2023-10-31 LAB — BASIC METABOLIC PANEL WITH GFR
Anion gap: 7 (ref 5–15)
BUN: 6 mg/dL (ref 6–20)
CO2: 24 mmol/L (ref 22–32)
Calcium: 8.6 mg/dL — ABNORMAL LOW (ref 8.9–10.3)
Chloride: 108 mmol/L (ref 98–111)
Creatinine, Ser: 0.76 mg/dL (ref 0.61–1.24)
GFR, Estimated: >60 mL/min (ref >60)
Glucose, Bld: 111 mg/dL — ABNORMAL HIGH (ref 70–99)
Potassium: 3.8 mmol/L (ref 3.5–5.1)
Sodium: 139 mmol/L (ref 135–145)

## 2023-10-31 LAB — CBC
HCT: 43.9 % (ref 39.0–52.0)
Hemoglobin: 14.8 g/dL (ref 13.0–17.0)
MCH: 32 pg (ref 26.0–34.0)
MCHC: 33.7 g/dL (ref 30.0–36.0)
MCV: 94.8 fL (ref 80.0–100.0)
Platelets: 259 10*3/uL (ref 150–400)
RBC: 4.63 MIL/uL (ref 4.22–5.81)
RDW: 12.8 % (ref 11.5–15.5)
WBC: 10.9 10*3/uL — ABNORMAL HIGH (ref 4.0–10.5)
nRBC: 0 % (ref 0.0–0.2)

## 2023-10-31 LAB — HEMOGLOBIN A1C
Hgb A1c MFr Bld: 5.2 % (ref 4.8–5.6)
Mean Plasma Glucose: 102.54 mg/dL

## 2023-10-31 LAB — VAS US DOPPLER PRE CABG
Left ABI: 0.86
Right ABI: 1.03

## 2023-10-31 LAB — HEPARIN LEVEL (UNFRACTIONATED)
Heparin Unfractionated: 0.29 [IU]/mL — ABNORMAL LOW (ref 0.30–0.70)
Heparin Unfractionated: 0.29 [IU]/mL — ABNORMAL LOW (ref 0.30–0.70)

## 2023-10-31 LAB — MAGNESIUM: Magnesium: 1.9 mg/dL (ref 1.7–2.4)

## 2023-10-31 MED ORDER — SPIRONOLACTONE 12.5 MG HALF TABLET
12.5000 mg | ORAL_TABLET | Freq: Every day | ORAL | Status: DC
  Filled 2023-10-31: qty 1

## 2023-10-31 MED ORDER — MAGNESIUM SULFATE 2 GM/50ML IV SOLN
2.0000 g | Freq: Once | INTRAVENOUS | Status: AC
  Administered 2023-10-31: 2 g via INTRAVENOUS
  Filled 2023-10-31: qty 50

## 2023-10-31 MED ORDER — CARVEDILOL 3.125 MG PO TABS
3.1250 mg | ORAL_TABLET | Freq: Two times a day (BID) | ORAL | Status: DC
  Filled 2023-10-31: qty 1

## 2023-10-31 MED ORDER — CHLORHEXIDINE GLUCONATE CLOTH 2 % EX PADS
6.0000 | MEDICATED_PAD | Freq: Every day | CUTANEOUS | Status: DC
Start: 2023-10-31 — End: 2023-11-05
  Administered 2023-10-31 – 2023-11-01 (×2): 6 via TOPICAL

## 2023-10-31 MED ORDER — CARVEDILOL 6.25 MG PO TABS: 6.2500 mg | ORAL_TABLET | Freq: Two times a day (BID) | ORAL | Status: DC

## 2023-10-31 MED ORDER — CARVEDILOL 6.25 MG PO TABS
6.2500 mg | ORAL_TABLET | Freq: Two times a day (BID) | ORAL | Status: DC
  Administered 2023-10-31 – 2023-11-01 (×3): 6.25 mg via ORAL
  Filled 2023-10-31 (×2): qty 1

## 2023-10-31 MED ORDER — POTASSIUM CHLORIDE CRYS ER 20 MEQ PO TBCR
40.0000 meq | EXTENDED_RELEASE_TABLET | Freq: Once | ORAL | Status: AC
  Administered 2023-10-31: 40 meq via ORAL
  Filled 2023-10-31: qty 2

## 2023-10-31 MED ORDER — HEPARIN (PORCINE) 25000 UT/250ML-% IV SOLN
2350.0000 [IU]/h | INTRAVENOUS | Status: DC
  Administered 2023-10-31: 2150 [IU]/h via INTRAVENOUS
  Administered 2023-10-31: 2100 [IU]/h via INTRAVENOUS
  Administered 2023-11-01 (×2): 2350 [IU]/h via INTRAVENOUS
  Filled 2023-10-31 (×5): qty 250

## 2023-10-31 MED ORDER — MUPIROCIN 2 % EX OINT
1.0000 | TOPICAL_OINTMENT | Freq: Two times a day (BID) | CUTANEOUS | Status: DC
Start: 2023-10-31 — End: 2023-11-05
  Administered 2023-10-31 – 2023-11-01 (×4): 1 via NASAL
  Filled 2023-10-31: qty 22

## 2023-10-31 MED ORDER — NIFEDIPINE ER OSMOTIC RELEASE 30 MG PO TB24
30.0000 mg | ORAL_TABLET | Freq: Every day | ORAL | Status: DC
  Administered 2023-10-31: 30 mg via ORAL
  Filled 2023-10-31 (×2): qty 1

## 2023-10-31 NOTE — Plan of Care (Signed)
  Problem: Education: Goal: Knowledge of General Education information will improve Description: Including pain rating scale, medication(s)/side effects and non-pharmacologic comfort measures Outcome: Progressing   Problem: Clinical Measurements: Goal: Will remain free from infection Outcome: Progressing Goal: Diagnostic test results will improve Outcome: Progressing   Problem: Nutrition: Goal: Adequate nutrition will be maintained Outcome: Progressing   Problem: Coping: Goal: Level of anxiety will decrease Outcome: Progressing   Problem: Pain Managment: Goal: General experience of comfort will improve and/or be controlled Outcome: Progressing   Problem: Safety: Goal: Ability to remain free from injury will improve Outcome: Progressing   Problem: Skin Integrity: Goal: Risk for impaired skin integrity will decrease Outcome: Progressing   Problem: Education: Goal: Understanding of cardiac disease, CV risk reduction, and recovery process will improve Outcome: Progressing Goal: Individualized Educational Video(s) Outcome: Progressing
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
## 2023-10-31 NOTE — Progress Notes (Addendum)
 Advanced Heart Failure Rounding Note  Cardiologist: Alvan Carrier, MD  Chief Complaint: NSTEMI Subjective:    Feels ok this morning just complaining of mild headache. Denies further chest  pain. On nitroglycerin  gtt at 20.   Plan to stop Aggrastat  this morning and transition to hep gtt.   Objective:   Weight Range: 122.2 kg Body mass index is 36.52 kg/m.   Vital Signs:   Temp:  [97.9 F (36.6 C)-98.8 F (37.1 C)] 98.1 F (36.7 C) (07/01 0400) Pulse Rate:  [80-117] 99 (07/01 0700) Resp:  [9-30] 15 (07/01 0700) BP: (89-139)/(51-105) 139/98 (07/01 0700) SpO2:  [91 %-98 %] 93 % (07/01 0700) Last BM Date : 10/30/23  Weight change: Filed Weights   10/28/23 2030 10/29/23 0437  Weight: 124.7 kg 122.2 kg   Intake/Output:   Intake/Output Summary (Last 24 hours) at 10/31/2023 0755 Last data filed at 10/31/2023 0700 Gross per 24 hour  Intake 1613.81 ml  Output 900 ml  Net 713.81 ml    Physical Exam  General:  well appearing.  No respiratory difficulty Neck: supple. JVD flat.  Cor: PMI nondisplaced. Tachy/regular rate & rhythm. No rubs, gallops or murmurs. Lungs: clear Extremities: no cyanosis, clubbing, rash, edema  Neuro: alert & oriented x 3. Moves all 4 extremities w/o difficulty. Affect flat.   Telemetry   ST low 100s 0-1 PVCs/hr (Personally reviewed)    EKG    No new EKG to review  Labs    CBC Recent Labs    10/30/23 0210 10/31/23 0428  WBC 11.5* 10.9*  HGB 15.4 14.8  HCT 44.3 43.9  MCV 94.1 94.8  PLT 242 259   Basic Metabolic Panel Recent Labs    93/69/74 0210 10/31/23 0428  NA 141 139  K 3.8 3.8  CL 108 108  CO2 25 24  GLUCOSE 105* 111*  BUN 7 6  CREATININE 0.75 0.76  CALCIUM  8.9 8.6*   Liver Function Tests No results for input(s): AST, ALT, ALKPHOS, BILITOT, PROT, ALBUMIN in the last 72 hours. No results for input(s): LIPASE, AMYLASE in the last 72 hours. Cardiac Enzymes No results for input(s): CKTOTAL,  CKMB, CKMBINDEX, TROPONINI in the last 72 hours.  BNP: BNP (last 3 results) No results for input(s): BNP in the last 8760 hours.  ProBNP (last 3 results) No results for input(s): PROBNP in the last 8760 hours.   D-Dimer No results for input(s): DDIMER in the last 72 hours. Hemoglobin A1C No results for input(s): HGBA1C in the last 72 hours. Fasting Lipid Panel Recent Labs    10/29/23 0511  CHOL 101  HDL 23*  LDLCALC 51  TRIG 866  CHOLHDL 4.4   Thyroid  Function Tests No results for input(s): TSH, T4TOTAL, T3FREE, THYROIDAB in the last 72 hours.  Invalid input(s): FREET3  Other results:   Imaging    ECHOCARDIOGRAM COMPLETE Result Date: 10/30/2023    ECHOCARDIOGRAM REPORT   Patient Name:   Brian Caldwell Date of Exam: 10/30/2023 Medical Rec #:  [id number]       Height:       72.0 in Accession #:    [id number]      Weight:       269.3 lb Date of Birth:  November 08, 1975        BSA:          2.418 m Patient Age:    48 years        BP:  106/71 mmHg Patient Gender: M               HR:           99 bpm. Exam Location:  Inpatient Procedure: 2D Echo, Cardiac Doppler and Color Doppler (Both Spectral and Color            Flow Doppler were utilized during procedure). Indications:    Chest Pain R07.9  History:        Patient has prior history of Echocardiogram examinations.  Sonographer:    Tinnie Gosling RDCS Referring Phys: [id number] GRAYCE BOLD IMPRESSIONS  1. Technically difficult study wiht poor views of the myocardium.  2. Left ventricular ejection fraction, by estimation, is 30 to 35%. The left ventricle has moderately decreased function. The left ventricle demonstrates regional wall motion abnormalities (see scoring diagram/findings for description). Left ventricular  diastolic parameters were normal.  3. Right ventricular systolic function is normal. The right ventricular size is normal.  4. The mitral valve is normal in structure. Trivial mitral valve  regurgitation.  5. The aortic valve is normal in structure. Aortic valve regurgitation is trivial.  6. The inferior vena cava is dilated in size with >50% respiratory variability, suggesting right atrial pressure of 8 mmHg. FINDINGS  Left Ventricle: Left ventricular ejection fraction, by estimation, is 30 to 35%. The left ventricle has moderately decreased function. The left ventricle demonstrates regional wall motion abnormalities. The left ventricular internal cavity size was normal in size. There is no left ventricular hypertrophy. Left ventricular diastolic parameters were normal.  LV Wall Scoring: The apical lateral segment, mid inferoseptal segment, apical septal segment, and apex are hypokinetic. Right Ventricle: The right ventricular size is normal. No increase in right ventricular wall thickness. Right ventricular systolic function is normal. Left Atrium: Left atrial size was normal in size. Right Atrium: Right atrial size was normal in size. Pericardium: There is no evidence of pericardial effusion. Mitral Valve: The mitral valve is normal in structure. Trivial mitral valve regurgitation. Tricuspid Valve: The tricuspid valve is normal in structure. Tricuspid valve regurgitation is trivial. Aortic Valve: The aortic valve is normal in structure. Aortic valve regurgitation is trivial. Pulmonic Valve: The pulmonic valve was normal in structure. Pulmonic valve regurgitation is not visualized. Aorta: The aortic root and ascending aorta are structurally normal, with no evidence of dilitation. Venous: The inferior vena cava is dilated in size with greater than 50% respiratory variability, suggesting right atrial pressure of 8 mmHg. IAS/Shunts: No atrial level shunt detected by color flow Doppler.  LEFT VENTRICLE PLAX 2D LVIDd:         5.20 cm      Diastology LVIDs:         4.20 cm      LV e' medial:    7.15 cm/s LV PW:         1.10 cm      LV E/e' medial:  10.8 LV IVS:        1.00 cm      LV e' lateral:   9.64  cm/s LVOT diam:     2.40 cm      LV E/e' lateral: 8.0 LV SV:         67 LV SV Index:   28 LVOT Area:     4.52 cm  LV Volumes (MOD) LV vol d, MOD A2C: 86.3 ml LV vol d, MOD A4C: 110.0 ml LV vol s, MOD A2C: 48.1 ml LV vol s, MOD A4C: 69.0 ml  LV SV MOD A2C:     38.2 ml LV SV MOD A4C:     110.0 ml LV SV MOD BP:      39.3 ml RIGHT VENTRICLE             IVC RV S prime:     10.60 cm/s  IVC diam: 2.10 cm TAPSE (M-mode): 1.9 cm LEFT ATRIUM             Index        RIGHT ATRIUM           Index LA diam:        3.50 cm 1.45 cm/m   RA Area:     10.40 cm LA Vol (A2C):   36.3 ml 15.01 ml/m  RA Volume:   22.30 ml  9.22 ml/m LA Vol (A4C):   38.3 ml 15.84 ml/m LA Biplane Vol: 37.8 ml 15.63 ml/m  AORTIC VALVE LVOT Vmax:   86.20 cm/s LVOT Vmean:  63.300 cm/s LVOT VTI:    0.149 m  AORTA Ao Root diam: 3.60 cm Ao Asc diam:  3.40 cm MITRAL VALVE MV Area (PHT): 4.26 cm    SHUNTS MV Decel Time: 178 msec    Systemic VTI:  0.15 m MV E velocity: 77.10 cm/s  Systemic Diam: 2.40 cm MV A velocity: 95.40 cm/s MV E/A ratio:  0.81 Aditya Sabharwal Electronically signed by Ria Commander Signature Date/Time: 10/30/2023/12:38:24 PM    Final      Medications:     Scheduled Medications:  atorvastatin   80 mg Oral Daily   Chlorhexidine Gluconate Cloth  6 each Topical Daily   Chlorhexidine Gluconate Cloth  6 each Topical Daily   mupirocin ointment  1 Application Nasal BID   nicotine   21 mg Transdermal Daily   sodium chloride  flush  3 mL Intravenous Q12H    Infusions:  sodium chloride  10 mL/hr at 10/31/23 0700   nitroGLYCERIN  25 mcg/min (10/31/23 0700)   tirofiban  0.15 mcg/kg/min (10/31/23 0700)    PRN Medications: sodium chloride , acetaminophen , ALPRAZolam , nitroGLYCERIN , ondansetron  (ZOFRAN ) IV, sodium chloride  flush    Patient Profile   Brian Caldwell is a 48 y.o. male with CAD with previous PCI in 78' and 16', HTN, HLD, hx of traumatic aortic dissection s/p repair and tobacco abuse. Admitted with late presenting  NSTEMI.   Assessment/Plan   NSTEMI / CAD - Hx STEMI 39' s/p bare-metal stent to LAD - Hx NSTEMI 90' s/p LCx and OM stent, PTCA of jailed LCx, RCA CTO with left-to-right collaterals  - LHC 21' PTCA to LCx (ISR) and stent to LAD  - LHC this admission with subtotally occluded proximal and distal LAD with thrombus and plaque formation in and around his old LAD stents. There is ~50% ISR of the LCx/OM stent as well and CTO of the RCA. Now s/p angioplasty of the proximal and distal LAD and restore flow. Unable to stent anything - Echo 6/30: EF 30-35%, LV with RWMA, RV normal, trivial MR - Continue statin. No ASA with allergy - Continue nitroglycerin  gtt, wean as tolerated.  - Continue tirofiban  for 24 hrs, plan to start heparin  later this morning - TCTS following - Denies current CP  2. HFrEF, iCM - Echo 6/30: EF 30-35%, LV with RWMA, RV normal, trivial MR - NYHA IV on admission - Volume appears stable, does not need additional diuretic at this time - Hold off on ACE/ARB/MRA with possible OR - Check Hgb A1c, plan for SGLT2i post-OR - Start  coreg  3.125 mg daily   3. HTN - SBP elevated - Start nifedipine 30 mg daily  - Start coreg  6.25 mg BID   4. HLD - LDL 51 10/29/23 - Continue statin    5. Tobacco abuse - Still smoking 2-3 PPD - imperative to stop smoking - Nicotine  patch  6. Headache - PRN tylenol  ordered - Wean off nitroglycerin  gtt   CRITICAL CARE Performed by: Beckey LITTIE Coe  Total critical care time: 13 minutes  Critical care time was exclusive of separately billable procedures and treating other patients.  Critical care was necessary to treat or prevent imminent or life-threatening deterioration.  Critical care was time spent personally by me on the following activities: development of treatment plan with patient and/or surrogate as well as nursing, discussions with consultants, evaluation of patient's response to treatment, examination of patient, obtaining history  from patient or surrogate, ordering and performing treatments and interventions, ordering and review of laboratory studies, ordering and review of radiographic studies, pulse oximetry and re-evaluation of patient's condition.   Length of Stay: 3  Beckey LITTIE Coe, NP  10/31/2023, 7:55 AM  Advanced Heart Failure Team Pager [phone] (M-F; 7a - 5p)  Please contact [facility] for night-coverage after hours (5p -7a ) and weekends on [email or web address]   Patient seen with NP, I formulated the plan and agree with the above note.   Patient remains on NTG gtt 20 mcg/min, no chest pain currently.  However, he has had chest pain when NTG gtt is weaned lower than this.  He has a headache with NTG.  No dyspnea.   Echo showed EF 30-35%, normal RV, trivial MR.  General: NAD Neck: No JVD, no thyromegaly or thyroid  nodule.  Lungs: Clear to auscultation bilaterally with normal respiratory effort. CV: Nondisplaced PMI.  Heart regular S1/S2, no S3/S4, no murmur.  No peripheral edema.   Abdomen: Soft, nontender, no hepatosplenomegaly, no distention.  Skin: Intact without lesions or rashes.  Neurologic: Alert and oriented x 3.  Psych: Normal affect. Extremities: No clubbing or cyanosis.  HEENT: Normal.    1. CAD: STEMI in 2013 with BMS LAD, NSTEMI 2016 with PCI to LCx and OM (CTO RCA), PTCA LCX ISR and PCI LAD in 2021.  Had stopped Plavix  and was smoking 2-3 ppd.  This admission with late-presentation anterior MI => PTCA to proximal and mid LAD, distal LAD remained occluded. Now CP-free on NTG gtt 20 mcg/min, but has developed chest pain with weaning of NTG.  Has HA with NTG.  - Will start Coreg  6.25 mg bid and nifedipine CR 30 mg daily as anti-anginals to try to wean off NTG gtt given HA.  - Transition from tirofiban  to heparin  gtt today.  - Continue atorvastatin .  - No ASA with true allergy.  - No P2Y12 inhibitor with plan for CABG.  - Multivessel disease with multiple prior PCIs and history of ISR, ongoing  smoking.  LAD target is not ideal, but I think that his best option is going to be full revascularization by CABG. Seen by Dr. Lightfoot, tentatively scheduled for Thursday.  May have to use radial arterial graft rather than LIMA given history of left thoracotomy and prior traumatic aortic disruption repair.   2. Ischemic cardiomyopathy: Echo showed EF 30-35%, normal RV, trivial MR.  Not significantly volume overloaded by exam.  - Add Coreg  as above.  - Will hold off on MRA, ARNI, SGLT2 inhibitor until after CABG.  3. Smoking: Strongly urged him to quit.  -  Continue nicotine  TP.  4. H/o traumatic descending aortic disruption with left thoracotomy and repair.   Ezra Shuck 10/31/2023 9:22 AM
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
## 2023-11-01 DIAGNOSIS — I214 Non-ST elevation (NSTEMI) myocardial infarction: Secondary | ICD-10-CM | POA: Diagnosis not present

## 2023-11-01 LAB — BASIC METABOLIC PANEL WITH GFR
Anion gap: 11 (ref 5–15)
BUN: 8 mg/dL (ref 6–20)
CO2: 22 mmol/L (ref 22–32)
Calcium: 8.9 mg/dL (ref 8.9–10.3)
Chloride: 108 mmol/L (ref 98–111)
Creatinine, Ser: 0.74 mg/dL (ref 0.61–1.24)
GFR, Estimated: >60 mL/min (ref >60)
Glucose, Bld: 111 mg/dL — ABNORMAL HIGH (ref 70–99)
Potassium: 3.8 mmol/L (ref 3.5–5.1)
Sodium: 141 mmol/L (ref 135–145)

## 2023-11-01 LAB — MAGNESIUM: Magnesium: 2.2 mg/dL (ref 1.7–2.4)

## 2023-11-01 LAB — PREPARE RBC (CROSSMATCH)

## 2023-11-01 LAB — CBC
HCT: 44.5 % (ref 39.0–52.0)
Hemoglobin: 14.8 g/dL (ref 13.0–17.0)
MCH: 31.5 pg (ref 26.0–34.0)
MCHC: 33.3 g/dL (ref 30.0–36.0)
MCV: 94.7 fL (ref 80.0–100.0)
Platelets: 254 10*3/uL (ref 150–400)
RBC: 4.7 MIL/uL (ref 4.22–5.81)
RDW: 12.7 % (ref 11.5–15.5)
WBC: 11.1 10*3/uL — ABNORMAL HIGH (ref 4.0–10.5)
nRBC: 0 % (ref 0.0–0.2)

## 2023-11-01 LAB — ABO/RH: ABO/RH(D): O POS

## 2023-11-01 LAB — HEPARIN LEVEL (UNFRACTIONATED)
Heparin Unfractionated: 0.23 [IU]/mL — ABNORMAL LOW (ref 0.30–0.70)
Heparin Unfractionated: 0.31 [IU]/mL (ref 0.30–0.70)

## 2023-11-01 MED ORDER — CEFAZOLIN SODIUM-DEXTROSE 2-4 GM/100ML-% IV SOLN
2.0000 g | INTRAVENOUS | Status: AC
  Administered 2023-11-02: 2 g via INTRAVENOUS
  Filled 2023-11-01: qty 100

## 2023-11-01 MED ORDER — FUROSEMIDE 10 MG/ML IJ SOLN
40.0000 mg | Freq: Once | INTRAMUSCULAR | Status: AC
  Administered 2023-11-01: 40 mg via INTRAVENOUS
  Filled 2023-11-01: qty 4

## 2023-11-01 MED ORDER — POTASSIUM CHLORIDE 2 MEQ/ML IV SOLN
80.0000 meq | INTRAVENOUS | Status: DC
  Filled 2023-11-01: qty 40

## 2023-11-01 MED ORDER — ACETAMINOPHEN 500 MG PO TABS
500.0000 mg | ORAL_TABLET | Freq: Four times a day (QID) | ORAL | Status: DC | PRN
  Administered 2023-11-01: 500 mg via ORAL

## 2023-11-01 MED ORDER — CEFAZOLIN SODIUM-DEXTROSE 3-4 GM/150ML-% IV SOLN
3.0000 g | INTRAVENOUS | Status: AC
  Administered 2023-11-02: 3 g via INTRAVENOUS
  Filled 2023-11-01: qty 150

## 2023-11-01 MED ORDER — PLASMA-LYTE A IV SOLN
INTRAVENOUS | Status: DC
  Filled 2023-11-01: qty 2.5

## 2023-11-01 MED ORDER — MANNITOL 20 % IV SOLN
INTRAVENOUS | Status: DC
  Filled 2023-11-01: qty 13

## 2023-11-01 MED ORDER — CARVEDILOL 6.25 MG PO TABS
6.2500 mg | ORAL_TABLET | Freq: Once | ORAL | Status: AC
  Administered 2023-11-01: 6.25 mg via ORAL
  Filled 2023-11-01: qty 1

## 2023-11-01 MED ORDER — CARVEDILOL 12.5 MG PO TABS
12.5000 mg | ORAL_TABLET | Freq: Two times a day (BID) | ORAL | Status: DC
  Administered 2023-11-01: 12.5 mg via ORAL
  Filled 2023-11-01: qty 1

## 2023-11-01 MED ORDER — CHLORHEXIDINE GLUCONATE CLOTH 2 % EX PADS
6.0000 | MEDICATED_PAD | Freq: Once | CUTANEOUS | Status: AC
  Administered 2023-11-01: 6 via TOPICAL

## 2023-11-01 MED ORDER — VANCOMYCIN HCL 1.5 G IV SOLR
1500.0000 mg | INTRAVENOUS | Status: AC
  Administered 2023-11-02: 1500 mg via INTRAVENOUS
  Filled 2023-11-01: qty 30

## 2023-11-01 MED ORDER — TRANEXAMIC ACID 1000 MG/10ML IV SOLN
1.5000 mg/kg/h | INTRAVENOUS | Status: AC
  Administered 2023-11-02: 1.5 mg/kg/h via INTRAVENOUS
  Filled 2023-11-01: qty 25

## 2023-11-01 MED ORDER — TRANEXAMIC ACID (OHS) PUMP PRIME SOLUTION
2.0000 mg/kg | INTRAVENOUS | Status: DC
  Filled 2023-11-01: qty 2.44

## 2023-11-01 MED ORDER — MILRINONE LACTATE IN DEXTROSE 20-5 MG/100ML-% IV SOLN
0.3000 ug/kg/min | INTRAVENOUS | Status: DC
  Filled 2023-11-01: qty 100

## 2023-11-01 MED ORDER — BISACODYL 5 MG PO TBEC
5.0000 mg | DELAYED_RELEASE_TABLET | Freq: Once | ORAL | Status: AC
  Administered 2023-11-01: 5 mg via ORAL
  Filled 2023-11-01: qty 1

## 2023-11-01 MED ORDER — EPINEPHRINE HCL 5 MG/250ML IV SOLN IN NS
0.0000 ug/min | INTRAVENOUS | Status: DC
  Filled 2023-11-01: qty 250

## 2023-11-01 MED ORDER — TEMAZEPAM 15 MG PO CAPS
15.0000 mg | ORAL_CAPSULE | Freq: Once | ORAL | Status: AC | PRN
  Administered 2023-11-01: 15 mg via ORAL
  Filled 2023-11-01: qty 1

## 2023-11-01 MED ORDER — CHLORHEXIDINE GLUCONATE CLOTH 2 % EX PADS
6.0000 | MEDICATED_PAD | Freq: Once | CUTANEOUS | Status: AC
  Administered 2023-11-02: 6 via TOPICAL

## 2023-11-01 MED ORDER — CHLORHEXIDINE GLUCONATE 0.12 % MT SOLN
15.0000 mL | Freq: Once | OROMUCOSAL | Status: AC
  Administered 2023-11-02: 15 mL via OROMUCOSAL
  Filled 2023-11-01: qty 15

## 2023-11-01 MED ORDER — METOPROLOL TARTRATE 12.5 MG HALF TABLET
12.5000 mg | ORAL_TABLET | Freq: Once | ORAL | Status: AC
  Administered 2023-11-02: 12.5 mg via ORAL
  Filled 2023-11-01: qty 1

## 2023-11-01 MED ORDER — NIFEDIPINE ER OSMOTIC RELEASE 60 MG PO TB24
60.0000 mg | ORAL_TABLET | Freq: Every day | ORAL | Status: DC
  Administered 2023-11-01: 60 mg via ORAL
  Filled 2023-11-01 (×2): qty 1
  Filled 2023-11-01: qty 2

## 2023-11-01 MED ORDER — TRANEXAMIC ACID (OHS) BOLUS VIA INFUSION
15.0000 mg/kg | INTRAVENOUS | Status: AC
  Administered 2023-11-02: 1830 mg via INTRAVENOUS

## 2023-11-01 MED ORDER — NITROGLYCERIN IN D5W 200-5 MCG/ML-% IV SOLN
2.0000 ug/min | INTRAVENOUS | Status: AC
  Administered 2023-11-02: 10 ug/min via INTRAVENOUS
  Filled 2023-11-01: qty 250

## 2023-11-01 MED ORDER — HEPARIN 30,000 UNITS/1000 ML (OHS) CELLSAVER SOLUTION
Status: DC
  Filled 2023-11-01: qty 1000

## 2023-11-01 MED ORDER — DEXMEDETOMIDINE HCL IN NACL 400 MCG/100ML IV SOLN
0.1000 ug/kg/h | INTRAVENOUS | Status: AC
  Administered 2023-11-02: .4 ug/kg/h via INTRAVENOUS
  Filled 2023-11-01: qty 100

## 2023-11-01 MED ORDER — NOREPINEPHRINE 4 MG/250ML-% IV SOLN
0.0000 ug/min | INTRAVENOUS | Status: AC
  Administered 2023-11-02: 3 ug/min via INTRAVENOUS
  Filled 2023-11-01: qty 250

## 2023-11-01 MED ORDER — MAGNESIUM SULFATE 50 % IJ SOLN
40.0000 meq | INTRAMUSCULAR | Status: DC
  Filled 2023-11-01: qty 9.85

## 2023-11-01 MED ORDER — ACETAMINOPHEN 500 MG PO TABS
500.0000 mg | ORAL_TABLET | Freq: Four times a day (QID) | ORAL | Status: DC
  Administered 2023-11-01 – 2023-11-02 (×3): 500 mg via ORAL
  Filled 2023-11-01 (×4): qty 1

## 2023-11-01 MED ORDER — PHENYLEPHRINE HCL-NACL 20-0.9 MG/250ML-% IV SOLN
30.0000 ug/min | INTRAVENOUS | Status: AC
  Administered 2023-11-02: 20 ug/min via INTRAVENOUS
  Administered 2023-11-02: 50 ug/min via INTRAVENOUS
  Filled 2023-11-01: qty 250

## 2023-11-01 MED ORDER — INSULIN REGULAR(HUMAN) IN NACL 100-0.9 UT/100ML-% IV SOLN
INTRAVENOUS | Status: AC
  Administered 2023-11-02: 1.1 [IU]/h via INTRAVENOUS
  Filled 2023-11-01: qty 100

## 2023-11-01 MED ORDER — POTASSIUM CHLORIDE CRYS ER 20 MEQ PO TBCR
40.0000 meq | EXTENDED_RELEASE_TABLET | Freq: Once | ORAL | Status: AC
  Administered 2023-11-01: 40 meq via ORAL
  Filled 2023-11-01: qty 2

## 2023-11-01 NOTE — Anesthesia Preprocedure Evaluation (Signed)
 Anesthesia Evaluation  Patient identified by MRN, date of birth, ID band Patient awake    Reviewed: Allergy & Precautions, H&P , NPO status , Patient's Chart, lab work & pertinent test results  Airway Mallampati: II  TM Distance: >3 FB Neck ROM: Full    Dental no notable dental hx. (+) Edentulous Upper, Edentulous Lower, Dental Advisory Given   Pulmonary neg pulmonary ROS, Current Smoker and Patient abstained from smoking.   Pulmonary exam normal breath sounds clear to auscultation       Cardiovascular Exercise Tolerance: Good hypertension, Pt. on medications and Pt. on home beta blockers + CAD, + Past MI and + Cardiac Stents   Rhythm:Regular Rate:Normal     Neuro/Psych negative neurological ROS  negative psych ROS   GI/Hepatic negative GI ROS, Neg liver ROS,,,  Endo/Other    Class 3 obesity  Renal/GU negative Renal ROS  negative genitourinary   Musculoskeletal  (+) Arthritis , Osteoarthritis,    Abdominal   Peds  Hematology negative hematology ROS (+)   Anesthesia Other Findings   Reproductive/Obstetrics negative OB ROS                              Anesthesia Physical Anesthesia Plan  ASA: 4  Anesthesia Plan: General   Post-op Pain Management: Tylenol  PO (pre-op)*   Induction: Intravenous  PONV Risk Score and Plan: 1 and Midazolam   Airway Management Planned: Oral ETT  Additional Equipment: Arterial line, CVP, TEE and Ultrasound Guidance Line Placement  Intra-op Plan:   Post-operative Plan: Post-operative intubation/ventilation  Informed Consent: I have reviewed the patients History and Physical, chart, labs and discussed the procedure including the risks, benefits and alternatives for the proposed anesthesia with the patient or authorized representative who has indicated his/her understanding and acceptance.     Dental advisory given  Plan Discussed with:  CRNA  Anesthesia Plan Comments:          Anesthesia Quick Evaluation
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
## 2023-11-01 NOTE — Plan of Care (Signed)
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
## 2023-11-01 NOTE — Discharge Instructions (Addendum)

## 2023-11-01 NOTE — Progress Notes (Signed)
 Discussed with pt IS (2500 ml), sternal precautions, mobility post op, and d/c planning. We also extensively discussed smoking cessation. Flat affect. Encouraged walking and IS later after visiting with family. He wants to quit smoking but has been unsuccessful in the past. 2-3 ppd.  1400-1450 Aliene Aris BS, ACSM-CEP 11/01/2023 2:51 PM

## 2023-11-02 ENCOUNTER — Inpatient Hospital Stay (HOSPITAL_COMMUNITY)
Admission: EM | Disposition: A | Discharge status: Home / Self Care | Payer: Self-pay | Source: Home / Self Care | Attending: Thoracic Surgery (Cardiothoracic Vascular Surgery)

## 2023-11-02 ENCOUNTER — Inpatient Hospital Stay (HOSPITAL_COMMUNITY): Payer: Self-pay | Admitting: Certified Registered Nurse Anesthetist

## 2023-11-02 ENCOUNTER — Other Ambulatory Visit: Payer: Self-pay

## 2023-11-02 ENCOUNTER — Encounter (HOSPITAL_COMMUNITY): Payer: Self-pay | Admitting: Certified Registered Nurse Anesthetist

## 2023-11-02 ENCOUNTER — Encounter (HOSPITAL_COMMUNITY): Payer: Self-pay | Admitting: Cardiology

## 2023-11-02 ENCOUNTER — Inpatient Hospital Stay (HOSPITAL_COMMUNITY)

## 2023-11-02 DIAGNOSIS — I5022 Chronic systolic (congestive) heart failure: Secondary | ICD-10-CM | POA: Diagnosis not present

## 2023-11-02 DIAGNOSIS — I2510 Atherosclerotic heart disease of native coronary artery without angina pectoris: Secondary | ICD-10-CM

## 2023-11-02 DIAGNOSIS — Z951 Presence of aortocoronary bypass graft: Secondary | ICD-10-CM | POA: Diagnosis not present

## 2023-11-02 DIAGNOSIS — I10 Essential (primary) hypertension: Secondary | ICD-10-CM

## 2023-11-02 DIAGNOSIS — I214 Non-ST elevation (NSTEMI) myocardial infarction: Secondary | ICD-10-CM

## 2023-11-02 DIAGNOSIS — F17210 Nicotine dependence, cigarettes, uncomplicated: Secondary | ICD-10-CM

## 2023-11-02 DIAGNOSIS — I252 Old myocardial infarction: Secondary | ICD-10-CM | POA: Diagnosis not present

## 2023-11-02 DIAGNOSIS — E119 Type 2 diabetes mellitus without complications: Secondary | ICD-10-CM

## 2023-11-02 DIAGNOSIS — E785 Hyperlipidemia, unspecified: Secondary | ICD-10-CM

## 2023-11-02 HISTORY — PX: TEE WITHOUT CARDIOVERSION: SHX5443

## 2023-11-02 HISTORY — PX: RADIAL ARTERY HARVEST: SHX5067

## 2023-11-02 HISTORY — PX: CORONARY ARTERY BYPASS GRAFT: SHX141

## 2023-11-02 LAB — POCT I-STAT 7, (LYTES, BLD GAS, ICA,H+H)
Acid-base deficit: 2 mmol/L (ref 0.0–2.0)
Acid-base deficit: 1 mmol/L (ref 0.0–2.0)
Acid-base deficit: 4 mmol/L — ABNORMAL HIGH (ref 0.0–2.0)
Acid-base deficit: 4 mmol/L — ABNORMAL HIGH (ref 0.0–2.0)
Acid-base deficit: 5 mmol/L — ABNORMAL HIGH (ref 0.0–2.0)
Acid-base deficit: 5 mmol/L — ABNORMAL HIGH (ref 0.0–2.0)
Acid-base deficit: 4 mmol/L — ABNORMAL HIGH (ref 0.0–2.0)
Bicarbonate: 26.2 mmol/L (ref 20.0–28.0)
Bicarbonate: 23.2 mmol/L (ref 20.0–28.0)
Bicarbonate: 22 mmol/L (ref 20.0–28.0)
Bicarbonate: 22.1 mmol/L (ref 20.0–28.0)
Bicarbonate: 20.2 mmol/L (ref 20.0–28.0)
Bicarbonate: 19.7 mmol/L — ABNORMAL LOW (ref 20.0–28.0)
Bicarbonate: 20.1 mmol/L (ref 20.0–28.0)
Calcium, Ion: 1.1 mmol/L — ABNORMAL LOW (ref 1.15–1.40)
Calcium, Ion: 1.09 mmol/L — ABNORMAL LOW (ref 1.15–1.40)
Calcium, Ion: 1.25 mmol/L (ref 1.15–1.40)
Calcium, Ion: 1.26 mmol/L (ref 1.15–1.40)
Calcium, Ion: 1.13 mmol/L — ABNORMAL LOW (ref 1.15–1.40)
Calcium, Ion: 1.1 mmol/L — ABNORMAL LOW (ref 1.15–1.40)
Calcium, Ion: 1.06 mmol/L — ABNORMAL LOW (ref 1.15–1.40)
HCT: 37 % — ABNORMAL LOW (ref 39.0–52.0)
HCT: 34 % — ABNORMAL LOW (ref 39.0–52.0)
HCT: 31 % — ABNORMAL LOW (ref 39.0–52.0)
HCT: 41 % (ref 39.0–52.0)
HCT: 41 % (ref 39.0–52.0)
HCT: 41 % (ref 39.0–52.0)
HCT: 38 % — ABNORMAL LOW (ref 39.0–52.0)
Hemoglobin: 12.6 g/dL — ABNORMAL LOW (ref 13.0–17.0)
Hemoglobin: 11.6 g/dL — ABNORMAL LOW (ref 13.0–17.0)
Hemoglobin: 10.5 g/dL — ABNORMAL LOW (ref 13.0–17.0)
Hemoglobin: 13.9 g/dL (ref 13.0–17.0)
Hemoglobin: 13.9 g/dL (ref 13.0–17.0)
Hemoglobin: 13.9 g/dL (ref 13.0–17.0)
Hemoglobin: 12.9 g/dL — ABNORMAL LOW (ref 13.0–17.0)
O2 Saturation: 100 %
O2 Saturation: 100 %
O2 Saturation: 100 %
O2 Saturation: 95 %
O2 Saturation: 97 %
O2 Saturation: 97 %
O2 Saturation: 95 %
Patient temperature: 36.9
Patient temperature: 37
Patient temperature: 37
Patient temperature: 37.8
Potassium: 4.9 mmol/L (ref 3.5–5.1)
Potassium: 5.6 mmol/L — ABNORMAL HIGH (ref 3.5–5.1)
Potassium: 4.8 mmol/L (ref 3.5–5.1)
Potassium: 5.4 mmol/L — ABNORMAL HIGH (ref 3.5–5.1)
Potassium: 6.1 mmol/L — ABNORMAL HIGH (ref 3.5–5.1)
Potassium: 6.2 mmol/L — ABNORMAL HIGH (ref 3.5–5.1)
Potassium: 5.3 mmol/L — ABNORMAL HIGH (ref 3.5–5.1)
Sodium: 138 mmol/L (ref 135–145)
Sodium: 137 mmol/L (ref 135–145)
Sodium: 138 mmol/L (ref 135–145)
Sodium: 138 mmol/L (ref 135–145)
Sodium: 137 mmol/L (ref 135–145)
Sodium: 137 mmol/L (ref 135–145)
Sodium: 136 mmol/L (ref 135–145)
TCO2: 28 mmol/L (ref 22–32)
TCO2: 24 mmol/L (ref 22–32)
TCO2: 23 mmol/L (ref 22–32)
TCO2: 23 mmol/L (ref 22–32)
TCO2: 21 mmol/L — ABNORMAL LOW (ref 22–32)
TCO2: 21 mmol/L — ABNORMAL LOW (ref 22–32)
TCO2: 21 mmol/L — ABNORMAL LOW (ref 22–32)
pCO2 arterial: 58 mmHg — ABNORMAL HIGH (ref 32–48)
pCO2 arterial: 35.2 mmHg (ref 32–48)
pCO2 arterial: 45 mmHg (ref 32–48)
pCO2 arterial: 45 mmHg (ref 32–48)
pCO2 arterial: 37.8 mmHg (ref 32–48)
pCO2 arterial: 35.1 mmHg (ref 32–48)
pCO2 arterial: 34.8 mmHg (ref 32–48)
pH, Arterial: 7.262 — ABNORMAL LOW (ref 7.35–7.45)
pH, Arterial: 7.427 (ref 7.35–7.45)
pH, Arterial: 7.297 — ABNORMAL LOW (ref 7.35–7.45)
pH, Arterial: 7.298 — ABNORMAL LOW (ref 7.35–7.45)
pH, Arterial: 7.336 — ABNORMAL LOW (ref 7.35–7.45)
pH, Arterial: 7.358 (ref 7.35–7.45)
pH, Arterial: 7.372 (ref 7.35–7.45)
pO2, Arterial: 316 mmHg — ABNORMAL HIGH (ref 83–108)
pO2, Arterial: 288 mmHg — ABNORMAL HIGH (ref 83–108)
pO2, Arterial: 241 mmHg — ABNORMAL HIGH (ref 83–108)
pO2, Arterial: 84 mmHg (ref 83–108)
pO2, Arterial: 96 mmHg (ref 83–108)
pO2, Arterial: 93 mmHg (ref 83–108)
pO2, Arterial: 80 mmHg — ABNORMAL LOW (ref 83–108)

## 2023-11-02 LAB — POCT I-STAT EG7
Acid-base deficit: 1 mmol/L (ref 0.0–2.0)
Bicarbonate: 27.3 mmol/L (ref 20.0–28.0)
Calcium, Ion: 1.13 mmol/L — ABNORMAL LOW (ref 1.15–1.40)
HCT: 37 % — ABNORMAL LOW (ref 39.0–52.0)
Hemoglobin: 12.6 g/dL — ABNORMAL LOW (ref 13.0–17.0)
O2 Saturation: 84 %
Potassium: 4.8 mmol/L (ref 3.5–5.1)
Sodium: 137 mmol/L (ref 135–145)
TCO2: 29 mmol/L (ref 22–32)
pCO2, Ven: 59.3 mmHg (ref 44–60)
pH, Ven: 7.27 (ref 7.25–7.43)
pO2, Ven: 56 mmHg — ABNORMAL HIGH (ref 32–45)

## 2023-11-02 LAB — CBC
HCT: 50.8 % (ref 39.0–52.0)
HCT: 42.2 % (ref 39.0–52.0)
HCT: 43.1 % (ref 39.0–52.0)
Hemoglobin: 17 g/dL (ref 13.0–17.0)
Hemoglobin: 14.3 g/dL (ref 13.0–17.0)
Hemoglobin: 14.8 g/dL (ref 13.0–17.0)
MCH: 32 pg (ref 26.0–34.0)
MCH: 32.6 pg (ref 26.0–34.0)
MCH: 32.1 pg (ref 26.0–34.0)
MCHC: 33.5 g/dL (ref 30.0–36.0)
MCHC: 33.9 g/dL (ref 30.0–36.0)
MCHC: 34.3 g/dL (ref 30.0–36.0)
MCV: 95.5 fL (ref 80.0–100.0)
MCV: 96.3 fL (ref 80.0–100.0)
MCV: 93.5 fL (ref 80.0–100.0)
Platelets: 249 10*3/uL (ref 150–400)
Platelets: 127 10*3/uL — ABNORMAL LOW (ref 150–400)
Platelets: 160 10*3/uL (ref 150–400)
RBC: 5.32 MIL/uL (ref 4.22–5.81)
RBC: 4.38 MIL/uL (ref 4.22–5.81)
RBC: 4.61 MIL/uL (ref 4.22–5.81)
RDW: 12.9 % (ref 11.5–15.5)
RDW: 12.8 % (ref 11.5–15.5)
RDW: 12.8 % (ref 11.5–15.5)
WBC: 11.6 10*3/uL — ABNORMAL HIGH (ref 4.0–10.5)
WBC: 16.5 10*3/uL — ABNORMAL HIGH (ref 4.0–10.5)
WBC: 16.5 10*3/uL — ABNORMAL HIGH (ref 4.0–10.5)
nRBC: 0 % (ref 0.0–0.2)
nRBC: 0 % (ref 0.0–0.2)
nRBC: 0 % (ref 0.0–0.2)

## 2023-11-02 LAB — POCT I-STAT, CHEM 8
BUN: 14 mg/dL (ref 6–20)
BUN: 14 mg/dL (ref 6–20)
BUN: 12 mg/dL (ref 6–20)
BUN: 13 mg/dL (ref 6–20)
BUN: 11 mg/dL (ref 6–20)
Calcium, Ion: 1.23 mmol/L (ref 1.15–1.40)
Calcium, Ion: 1.16 mmol/L (ref 1.15–1.40)
Calcium, Ion: 1.07 mmol/L — ABNORMAL LOW (ref 1.15–1.40)
Calcium, Ion: 0.99 mmol/L — ABNORMAL LOW (ref 1.15–1.40)
Calcium, Ion: 1.23 mmol/L (ref 1.15–1.40)
Chloride: 104 mmol/L (ref 98–111)
Chloride: 105 mmol/L (ref 98–111)
Chloride: 103 mmol/L (ref 98–111)
Chloride: 103 mmol/L (ref 98–111)
Chloride: 105 mmol/L (ref 98–111)
Creatinine, Ser: 0.8 mg/dL (ref 0.61–1.24)
Creatinine, Ser: 0.8 mg/dL (ref 0.61–1.24)
Creatinine, Ser: 0.8 mg/dL (ref 0.61–1.24)
Creatinine, Ser: 0.7 mg/dL (ref 0.61–1.24)
Creatinine, Ser: 0.7 mg/dL (ref 0.61–1.24)
Glucose, Bld: 113 mg/dL — ABNORMAL HIGH (ref 70–99)
Glucose, Bld: 119 mg/dL — ABNORMAL HIGH (ref 70–99)
Glucose, Bld: 112 mg/dL — ABNORMAL HIGH (ref 70–99)
Glucose, Bld: 132 mg/dL — ABNORMAL HIGH (ref 70–99)
Glucose, Bld: 144 mg/dL — ABNORMAL HIGH (ref 70–99)
HCT: 49 % (ref 39.0–52.0)
HCT: 46 % (ref 39.0–52.0)
HCT: 34 % — ABNORMAL LOW (ref 39.0–52.0)
HCT: 32 % — ABNORMAL LOW (ref 39.0–52.0)
HCT: 34 % — ABNORMAL LOW (ref 39.0–52.0)
Hemoglobin: 16.7 g/dL (ref 13.0–17.0)
Hemoglobin: 15.6 g/dL (ref 13.0–17.0)
Hemoglobin: 11.6 g/dL — ABNORMAL LOW (ref 13.0–17.0)
Hemoglobin: 10.9 g/dL — ABNORMAL LOW (ref 13.0–17.0)
Hemoglobin: 11.6 g/dL — ABNORMAL LOW (ref 13.0–17.0)
Potassium: 5 mmol/L (ref 3.5–5.1)
Potassium: 5 mmol/L (ref 3.5–5.1)
Potassium: 5.8 mmol/L — ABNORMAL HIGH (ref 3.5–5.1)
Potassium: 5.6 mmol/L — ABNORMAL HIGH (ref 3.5–5.1)
Potassium: 4.8 mmol/L (ref 3.5–5.1)
Sodium: 137 mmol/L (ref 135–145)
Sodium: 137 mmol/L (ref 135–145)
Sodium: 136 mmol/L (ref 135–145)
Sodium: 135 mmol/L (ref 135–145)
Sodium: 137 mmol/L (ref 135–145)
TCO2: 26 mmol/L (ref 22–32)
TCO2: 21 mmol/L — ABNORMAL LOW (ref 22–32)
TCO2: 25 mmol/L (ref 22–32)
TCO2: 24 mmol/L (ref 22–32)
TCO2: 22 mmol/L (ref 22–32)

## 2023-11-02 LAB — GLUCOSE, CAPILLARY
Glucose-Capillary: 112 mg/dL — ABNORMAL HIGH (ref 70–99)
Glucose-Capillary: 132 mg/dL — ABNORMAL HIGH (ref 70–99)
Glucose-Capillary: 121 mg/dL — ABNORMAL HIGH (ref 70–99)
Glucose-Capillary: 116 mg/dL — ABNORMAL HIGH (ref 70–99)
Glucose-Capillary: 104 mg/dL — ABNORMAL HIGH (ref 70–99)
Glucose-Capillary: 103 mg/dL — ABNORMAL HIGH (ref 70–99)

## 2023-11-02 LAB — BASIC METABOLIC PANEL WITH GFR
Anion gap: 13 (ref 5–15)
Anion gap: 7 (ref 5–15)
BUN: 13 mg/dL (ref 6–20)
BUN: 9 mg/dL (ref 6–20)
CO2: 22 mmol/L (ref 22–32)
CO2: 21 mmol/L — ABNORMAL LOW (ref 22–32)
Calcium: 9.4 mg/dL (ref 8.9–10.3)
Calcium: 7.5 mg/dL — ABNORMAL LOW (ref 8.9–10.3)
Chloride: 101 mmol/L (ref 98–111)
Chloride: 108 mmol/L (ref 98–111)
Creatinine, Ser: 0.79 mg/dL (ref 0.61–1.24)
Creatinine, Ser: 0.79 mg/dL (ref 0.61–1.24)
GFR, Estimated: >60 mL/min (ref >60)
GFR, Estimated: >60 mL/min (ref >60)
Glucose, Bld: 104 mg/dL — ABNORMAL HIGH (ref 70–99)
Glucose, Bld: 119 mg/dL — ABNORMAL HIGH (ref 70–99)
Potassium: 4.1 mmol/L (ref 3.5–5.1)
Potassium: 5.9 mmol/L — ABNORMAL HIGH (ref 3.5–5.1)
Sodium: 136 mmol/L (ref 135–145)
Sodium: 136 mmol/L (ref 135–145)

## 2023-11-02 LAB — ECHO INTRAOPERATIVE TEE
Height: 72 in
S' Lateral: 4.6 cm
Weight: 4304 [oz_av]

## 2023-11-02 LAB — PROTIME-INR
INR: 1.3 — ABNORMAL HIGH (ref 0.8–1.2)
Prothrombin Time: 16.4 s — ABNORMAL HIGH (ref 11.4–15.2)

## 2023-11-02 LAB — MAGNESIUM
Magnesium: 1.9 mg/dL (ref 1.7–2.4)
Magnesium: 2.7 mg/dL — ABNORMAL HIGH (ref 1.7–2.4)

## 2023-11-02 LAB — HEMOGLOBIN AND HEMATOCRIT, BLOOD
HCT: 37.4 % — ABNORMAL LOW (ref 39.0–52.0)
Hemoglobin: 12.7 g/dL — ABNORMAL LOW (ref 13.0–17.0)

## 2023-11-02 LAB — APTT: aPTT: 32 s (ref 24–36)

## 2023-11-02 LAB — PLATELET COUNT: Platelets: 206 10*3/uL (ref 150–400)

## 2023-11-02 LAB — HEPARIN LEVEL (UNFRACTIONATED): Heparin Unfractionated: 0.37 [IU]/mL (ref 0.30–0.70)

## 2023-11-02 SURGERY — CORONARY ARTERY BYPASS GRAFTING (CABG)
Anesthesia: General | Site: Chest | Laterality: Right

## 2023-11-02 MED ORDER — MIDAZOLAM HCL (PF) 10 MG/2ML IJ SOLN
INTRAMUSCULAR | Status: AC
  Filled 2023-11-02: qty 2

## 2023-11-02 MED ORDER — PHENYLEPHRINE 80 MCG/ML (10ML) SYRINGE FOR IV PUSH (FOR BLOOD PRESSURE SUPPORT)
PREFILLED_SYRINGE | INTRAVENOUS | Status: AC
  Filled 2023-11-02: qty 10

## 2023-11-02 MED ORDER — FENTANYL CITRATE (PF) 250 MCG/5ML IJ SOLN
INTRAMUSCULAR | Status: AC
  Filled 2023-11-02: qty 5

## 2023-11-02 MED ORDER — PANTOPRAZOLE SODIUM 40 MG IV SOLR
40.0000 mg | Freq: Every day | INTRAVENOUS | Status: AC
Start: 2023-11-02 — End: 2023-11-03
  Administered 2023-11-02 – 2023-11-03 (×2): 40 mg via INTRAVENOUS
  Filled 2023-11-02 (×2): qty 10

## 2023-11-02 MED ORDER — CHLORHEXIDINE GLUCONATE CLOTH 2 % EX PADS
6.0000 | MEDICATED_PAD | Freq: Every day | CUTANEOUS | Status: AC
  Administered 2023-11-02 – 2023-11-06 (×4): 6 via TOPICAL

## 2023-11-02 MED ORDER — HEPARIN SODIUM (PORCINE) 1000 UNIT/ML IJ SOLN
INTRAMUSCULAR | Status: DC | PRN
  Administered 2023-11-02: 39000 [IU] via INTRAVENOUS
  Administered 2023-11-02: 10000 [IU] via INTRAVENOUS

## 2023-11-02 MED ORDER — ARTIFICIAL TEARS OPHTHALMIC OINT
TOPICAL_OINTMENT | OPHTHALMIC | Status: DC | PRN
  Administered 2023-11-02: 1 via OPHTHALMIC

## 2023-11-02 MED ORDER — NICARDIPINE HCL IN NACL 20-0.86 MG/200ML-% IV SOLN
INTRAVENOUS | Status: DC | PRN
  Administered 2023-11-02: 1 mg/h via INTRAVENOUS

## 2023-11-02 MED ORDER — LACTATED RINGERS IV SOLN: INTRAVENOUS | Status: DC | PRN

## 2023-11-02 MED ORDER — EPHEDRINE 5 MG/ML INJ
INTRAVENOUS | Status: AC
  Filled 2023-11-02: qty 5

## 2023-11-02 MED ORDER — SODIUM CHLORIDE (PF) 0.9 % IJ SOLN
INTRAMUSCULAR | Status: AC
  Filled 2023-11-02: qty 30

## 2023-11-02 MED ORDER — ALBUMIN HUMAN 5 % IV SOLN: INTRAVENOUS | Status: DC | PRN

## 2023-11-02 MED ORDER — NICARDIPINE HCL IN NACL 20-0.86 MG/200ML-% IV SOLN
0.0000 mg/h | INTRAVENOUS | Status: DC
  Administered 2023-11-02: 1 mg/h via INTRAVENOUS
  Filled 2023-11-02: qty 200

## 2023-11-02 MED ORDER — PROPOFOL 10 MG/ML IV BOLUS
INTRAVENOUS | Status: DC | PRN
  Administered 2023-11-02: 10 mg via INTRAVENOUS
  Administered 2023-11-02: 50 mg via INTRAVENOUS

## 2023-11-02 MED ORDER — ORAL CARE MOUTH RINSE: 15.0000 mL | OROMUCOSAL | Status: DC | PRN

## 2023-11-02 MED ORDER — PHENYLEPHRINE 80 MCG/ML (10ML) SYRINGE FOR IV PUSH (FOR BLOOD PRESSURE SUPPORT)
PREFILLED_SYRINGE | INTRAVENOUS | Status: DC | PRN
  Administered 2023-11-02: 160 ug via INTRAVENOUS
  Administered 2023-11-02: 80 ug via INTRAVENOUS
  Administered 2023-11-02: 160 ug via INTRAVENOUS
  Administered 2023-11-02: 80 ug via INTRAVENOUS
  Administered 2023-11-02: 40 ug via INTRAVENOUS
  Administered 2023-11-02: 160 ug via INTRAVENOUS

## 2023-11-02 MED ORDER — SODIUM CHLORIDE (PF) 0.9 % IJ SOLN
INTRAMUSCULAR | Status: AC
Start: 2023-11-02 — End: 2023-11-02
  Filled 2023-11-02: qty 10

## 2023-11-02 MED ORDER — POTASSIUM CHLORIDE 10 MEQ/50ML IV SOLN: 10.0000 meq | INTRAVENOUS | Status: AC

## 2023-11-02 MED ORDER — LIDOCAINE 2% (20 MG/ML) 5 ML SYRINGE
INTRAMUSCULAR | Status: AC
  Filled 2023-11-02: qty 5

## 2023-11-02 MED ORDER — SODIUM CHLORIDE 0.9% FLUSH: 3.0000 mL | Freq: Two times a day (BID) | INTRAVENOUS | Status: DC

## 2023-11-02 MED ORDER — ROCURONIUM BROMIDE 10 MG/ML (PF) SYRINGE
PREFILLED_SYRINGE | INTRAVENOUS | Status: DC | PRN
  Administered 2023-11-02: 50 mg via INTRAVENOUS
  Administered 2023-11-02: 100 mg via INTRAVENOUS
  Administered 2023-11-02: 50 mg via INTRAVENOUS
  Administered 2023-11-02: 60 mg via INTRAVENOUS
  Administered 2023-11-02 (×2): 50 mg via INTRAVENOUS
  Administered 2023-11-02: 40 mg via INTRAVENOUS

## 2023-11-02 MED ORDER — HEPARIN SODIUM (PORCINE) 1000 UNIT/ML IJ SOLN
INTRAMUSCULAR | Status: AC
  Filled 2023-11-02: qty 20

## 2023-11-02 MED ORDER — BISACODYL 5 MG PO TBEC
10.0000 mg | DELAYED_RELEASE_TABLET | Freq: Every day | ORAL | Status: DC
  Administered 2023-11-03 – 2023-11-08 (×6): 10 mg via ORAL
  Filled 2023-11-02 (×8): qty 2

## 2023-11-02 MED ORDER — PROPOFOL 10 MG/ML IV BOLUS
INTRAVENOUS | Status: AC
Start: 2023-11-02 — End: 2023-11-02
  Filled 2023-11-02: qty 20

## 2023-11-02 MED ORDER — SODIUM CHLORIDE 0.9 % IV SOLN: INTRAVENOUS | Status: DC | PRN

## 2023-11-02 MED ORDER — MIDAZOLAM HCL 2 MG/2ML IJ SOLN
INTRAMUSCULAR | Status: AC
  Filled 2023-11-02: qty 2

## 2023-11-02 MED ORDER — LEVOFLOXACIN IN D5W 750 MG/150ML IV SOLN
750.0000 mg | INTRAVENOUS | Status: AC
  Administered 2023-11-03: 750 mg via INTRAVENOUS
  Filled 2023-11-02: qty 150

## 2023-11-02 MED ORDER — OXYCODONE HCL 5 MG PO TABS
5.0000 mg | ORAL_TABLET | ORAL | Status: DC | PRN
  Administered 2023-11-02: 10 mg via ORAL
  Administered 2023-11-02: 5 mg via ORAL
  Administered 2023-11-03 – 2023-11-10 (×37): 10 mg via ORAL
  Filled 2023-11-02 (×38): qty 2
  Filled 2023-11-02: qty 1

## 2023-11-02 MED ORDER — PLASMA-LYTE A IV SOLN
INTRAVENOUS | Status: DC | PRN
  Administered 2023-11-02: 500 mL via INTRAVASCULAR

## 2023-11-02 MED ORDER — SODIUM BICARBONATE 8.4 % IV SOLN
50.0000 meq | Freq: Once | INTRAVENOUS | Status: AC
  Administered 2023-11-02: 50 meq via INTRAVENOUS

## 2023-11-02 MED ORDER — ONDANSETRON HCL 4 MG/2ML IJ SOLN
INTRAMUSCULAR | Status: AC
  Filled 2023-11-02: qty 2

## 2023-11-02 MED ORDER — ACETAMINOPHEN 160 MG/5ML PO SOLN
650.0000 mg | Freq: Once | ORAL | Status: AC
  Administered 2023-11-02: 650 mg
  Filled 2023-11-02: qty 20.3

## 2023-11-02 MED ORDER — NOREPINEPHRINE 4 MG/250ML-% IV SOLN: 0.0000 ug/min | INTRAVENOUS | Status: DC

## 2023-11-02 MED ORDER — DOBUTAMINE-DEXTROSE 4-5 MG/ML-% IV SOLN
2.5000 ug/kg/min | INTRAVENOUS | Status: DC
Start: 2023-11-02 — End: 2023-11-04
  Administered 2023-11-04: 2.5 ug/kg/min via INTRAVENOUS
  Filled 2023-11-02: qty 250

## 2023-11-02 MED ORDER — SODIUM CHLORIDE 0.9% FLUSH: 3.0000 mL | INTRAVENOUS | Status: DC | PRN

## 2023-11-02 MED ORDER — HEMOSTATIC AGENTS (NO CHARGE) OPTIME
TOPICAL | Status: DC | PRN
  Administered 2023-11-02: 1 via TOPICAL

## 2023-11-02 MED ORDER — LACTATED RINGERS IV SOLN: INTRAVENOUS | Status: DC

## 2023-11-02 MED ORDER — PROTAMINE SULFATE 10 MG/ML IV SOLN
INTRAVENOUS | Status: DC | PRN
Start: 2023-11-02 — End: 2023-11-02
  Administered 2023-11-02: 360 mg via INTRAVENOUS
  Administered 2023-11-02: 40 mg via INTRAVENOUS

## 2023-11-02 MED ORDER — ACETAMINOPHEN 500 MG PO TABS
1000.0000 mg | ORAL_TABLET | Freq: Four times a day (QID) | ORAL | Status: AC
  Administered 2023-11-02 – 2023-11-07 (×18): 1000 mg via ORAL
  Filled 2023-11-02 (×19): qty 2

## 2023-11-02 MED ORDER — DEXAMETHASONE SODIUM PHOSPHATE 10 MG/ML IJ SOLN
INTRAMUSCULAR | Status: AC
  Filled 2023-11-02: qty 1

## 2023-11-02 MED ORDER — MAGNESIUM SULFATE 4 GM/100ML IV SOLN
4.0000 g | Freq: Once | INTRAVENOUS | Status: AC
  Administered 2023-11-02: 4 g via INTRAVENOUS
  Filled 2023-11-02: qty 100

## 2023-11-02 MED ORDER — ROCURONIUM BROMIDE 10 MG/ML (PF) SYRINGE
PREFILLED_SYRINGE | INTRAVENOUS | Status: AC
  Filled 2023-11-02: qty 10

## 2023-11-02 MED ORDER — ETOMIDATE 2 MG/ML IV SOLN
INTRAVENOUS | Status: AC
Start: 2023-11-02 — End: 2023-11-02
  Filled 2023-11-02: qty 10

## 2023-11-02 MED ORDER — TRANEXAMIC ACID 1000 MG/10ML IV SOLN
1.5000 mg/kg/h | INTRAVENOUS | Status: DC
  Filled 2023-11-02: qty 25

## 2023-11-02 MED ORDER — MUPIROCIN 2 % EX OINT
1.0000 | TOPICAL_OINTMENT | Freq: Two times a day (BID) | CUTANEOUS | Status: AC
  Administered 2023-11-02 – 2023-11-07 (×10): 1 via NASAL
  Filled 2023-11-02 (×2): qty 22

## 2023-11-02 MED ORDER — ONDANSETRON HCL 4 MG/2ML IJ SOLN
INTRAMUSCULAR | Status: DC | PRN
  Administered 2023-11-02: 4 mg via INTRAVENOUS

## 2023-11-02 MED ORDER — ORAL CARE MOUTH RINSE
15.0000 mL | OROMUCOSAL | Status: DC
  Administered 2023-11-02: 15 mL via OROMUCOSAL

## 2023-11-02 MED ORDER — SUCCINYLCHOLINE CHLORIDE 200 MG/10ML IV SOSY
PREFILLED_SYRINGE | INTRAVENOUS | Status: DC | PRN
  Administered 2023-11-02: 140 mg via INTRAVENOUS

## 2023-11-02 MED ORDER — PANTOPRAZOLE SODIUM 40 MG PO TBEC
40.0000 mg | DELAYED_RELEASE_TABLET | Freq: Every day | ORAL | Status: DC
  Administered 2023-11-04 – 2023-11-10 (×7): 40 mg via ORAL
  Filled 2023-11-02 (×7): qty 1

## 2023-11-02 MED ORDER — METOPROLOL TARTRATE 5 MG/5ML IV SOLN: 2.5000 mg | INTRAVENOUS | Status: DC | PRN

## 2023-11-02 MED ORDER — ONDANSETRON HCL 4 MG/2ML IJ SOLN
INTRAMUSCULAR | Status: AC
Start: 2023-11-02 — End: 2023-11-02
  Filled 2023-11-02: qty 2

## 2023-11-02 MED ORDER — DEXMEDETOMIDINE HCL IN NACL 400 MCG/100ML IV SOLN
0.0000 ug/kg/h | INTRAVENOUS | Status: DC
  Administered 2023-11-02: 0.8 ug/kg/h via INTRAVENOUS
  Filled 2023-11-02: qty 100

## 2023-11-02 MED ORDER — BISACODYL 10 MG RE SUPP: 10.0000 mg | Freq: Every day | RECTAL | Status: DC

## 2023-11-02 MED ORDER — VANCOMYCIN HCL IN DEXTROSE 1-5 GM/200ML-% IV SOLN
1000.0000 mg | Freq: Once | INTRAVENOUS | Status: AC
Start: 2023-11-02 — End: 2023-11-02
  Administered 2023-11-02: 1000 mg via INTRAVENOUS
  Filled 2023-11-02: qty 200

## 2023-11-02 MED ORDER — METOCLOPRAMIDE HCL 5 MG/ML IJ SOLN
10.0000 mg | Freq: Four times a day (QID) | INTRAMUSCULAR | Status: AC
Start: 2023-11-02 — End: 2023-11-03
  Administered 2023-11-02 – 2023-11-03 (×6): 10 mg via INTRAVENOUS
  Filled 2023-11-02 (×6): qty 2

## 2023-11-02 MED ORDER — SODIUM CHLORIDE 0.9 % IV SOLN: INTRAVENOUS | Status: DC

## 2023-11-02 MED ORDER — INSULIN ASPART 100 UNIT/ML IJ SOLN
0.0000 [IU] | INTRAMUSCULAR | Status: DC
  Administered 2023-11-03: 4 [IU] via SUBCUTANEOUS
  Administered 2023-11-03 – 2023-11-04 (×5): 2 [IU] via SUBCUTANEOUS
  Administered 2023-11-04: 4 [IU] via SUBCUTANEOUS

## 2023-11-02 MED ORDER — MORPHINE SULFATE (PF) 2 MG/ML IV SOLN
1.0000 mg | INTRAVENOUS | Status: DC | PRN
  Administered 2023-11-02: 2 mg via INTRAVENOUS
  Administered 2023-11-02: 1 mg via INTRAVENOUS
  Administered 2023-11-03 (×4): 4 mg via INTRAVENOUS
  Administered 2023-11-03: 1 mg via INTRAVENOUS
  Administered 2023-11-03: 4 mg via INTRAVENOUS
  Administered 2023-11-03: 1 mg via INTRAVENOUS
  Administered 2023-11-04 (×2): 4 mg via INTRAVENOUS
  Filled 2023-11-02 (×2): qty 1
  Filled 2023-11-02 (×2): qty 2
  Filled 2023-11-02: qty 1
  Filled 2023-11-02 (×2): qty 2
  Filled 2023-11-02: qty 1
  Filled 2023-11-02 (×3): qty 2

## 2023-11-02 MED ORDER — 0.9 % SODIUM CHLORIDE (POUR BTL) OPTIME
TOPICAL | Status: DC | PRN
  Administered 2023-11-02: 7000 mL

## 2023-11-02 MED ORDER — FENTANYL CITRATE (PF) 250 MCG/5ML IJ SOLN
INTRAMUSCULAR | Status: AC
Start: 2023-11-02 — End: 2023-11-02
  Filled 2023-11-02: qty 5

## 2023-11-02 MED ORDER — MIDAZOLAM HCL (PF) 5 MG/ML IJ SOLN
INTRAMUSCULAR | Status: DC | PRN
  Administered 2023-11-02: 2 mg via INTRAVENOUS
  Administered 2023-11-02: 1 mg via INTRAVENOUS
  Administered 2023-11-02: 2 mg via INTRAVENOUS
  Administered 2023-11-02: 1 mg via INTRAVENOUS
  Administered 2023-11-02 (×2): 2 mg via INTRAVENOUS

## 2023-11-02 MED ORDER — ONDANSETRON HCL 4 MG/2ML IJ SOLN
4.0000 mg | Freq: Four times a day (QID) | INTRAMUSCULAR | Status: DC | PRN
  Administered 2023-11-03 – 2023-11-05 (×4): 4 mg via INTRAVENOUS
  Filled 2023-11-02 (×6): qty 2

## 2023-11-02 MED ORDER — PROTAMINE SULFATE 10 MG/ML IV SOLN
INTRAVENOUS | Status: AC
  Filled 2023-11-02: qty 50

## 2023-11-02 MED ORDER — HEPARIN SODIUM (PORCINE) 1000 UNIT/ML IJ SOLN
INTRAMUSCULAR | Status: AC
  Filled 2023-11-02: qty 1

## 2023-11-02 MED ORDER — VASOPRESSIN 20 UNIT/ML IV SOLN
INTRAVENOUS | Status: DC | PRN
Start: 2023-11-02 — End: 2023-11-02
  Administered 2023-11-02: 1 [IU] via INTRAVENOUS

## 2023-11-02 MED ORDER — TRAMADOL HCL 50 MG PO TABS
50.0000 mg | ORAL_TABLET | ORAL | Status: DC | PRN
  Administered 2023-11-02 – 2023-11-08 (×14): 100 mg via ORAL
  Filled 2023-11-02 (×14): qty 2

## 2023-11-02 MED ORDER — ATORVASTATIN CALCIUM 80 MG PO TABS
80.0000 mg | ORAL_TABLET | Freq: Every day | ORAL | Status: DC
  Administered 2023-11-03 – 2023-11-10 (×8): 80 mg via ORAL
  Filled 2023-11-02 (×8): qty 1

## 2023-11-02 MED ORDER — ALBUMIN HUMAN 5 % IV SOLN: 250.0000 mL | INTRAVENOUS | Status: DC | PRN

## 2023-11-02 MED ORDER — ROCURONIUM BROMIDE 10 MG/ML (PF) SYRINGE
PREFILLED_SYRINGE | INTRAVENOUS | Status: AC
  Filled 2023-11-02: qty 30

## 2023-11-02 MED ORDER — PROPOFOL 10 MG/ML IV BOLUS
INTRAVENOUS | Status: AC
  Filled 2023-11-02: qty 20

## 2023-11-02 MED ORDER — CHLORHEXIDINE GLUCONATE 0.12 % MT SOLN
15.0000 mL | OROMUCOSAL | Status: AC
  Administered 2023-11-02: 15 mL via OROMUCOSAL
  Filled 2023-11-02: qty 15

## 2023-11-02 MED ORDER — DOBUTAMINE-DEXTROSE 4-5 MG/ML-% IV SOLN
INTRAVENOUS | Status: DC | PRN
  Administered 2023-11-02: 2.5 ug/kg/min via INTRAVENOUS

## 2023-11-02 MED ORDER — MIDAZOLAM HCL 2 MG/2ML IJ SOLN
2.0000 mg | INTRAMUSCULAR | Status: DC | PRN
  Administered 2023-11-02: 2 mg via INTRAVENOUS
  Filled 2023-11-02: qty 2

## 2023-11-02 MED ORDER — INSULIN REGULAR(HUMAN) IN NACL 100-0.9 UT/100ML-% IV SOLN: INTRAVENOUS | Status: DC

## 2023-11-02 MED ORDER — METOPROLOL TARTRATE 25 MG/10 ML ORAL SUSPENSION: 12.5000 mg | Freq: Two times a day (BID) | ORAL | Status: DC

## 2023-11-02 MED ORDER — DOCUSATE SODIUM 100 MG PO CAPS
200.0000 mg | ORAL_CAPSULE | Freq: Every day | ORAL | Status: DC
  Administered 2023-11-03 – 2023-11-08 (×6): 200 mg via ORAL
  Filled 2023-11-02 (×9): qty 2

## 2023-11-02 MED ORDER — DEXTROSE 50 % IV SOLN: 0.0000 mL | INTRAVENOUS | Status: DC | PRN

## 2023-11-02 MED ORDER — SODIUM CHLORIDE (PF) 0.9 % IJ SOLN
OROMUCOSAL | Status: DC | PRN
  Administered 2023-11-02 (×2): 4 mL via TOPICAL

## 2023-11-02 MED ORDER — ACETAMINOPHEN 160 MG/5ML PO SOLN: 1000.0000 mg | Freq: Four times a day (QID) | ORAL | Status: AC

## 2023-11-02 MED ORDER — SUCCINYLCHOLINE CHLORIDE 200 MG/10ML IV SOSY
PREFILLED_SYRINGE | INTRAVENOUS | Status: AC
  Filled 2023-11-02: qty 10

## 2023-11-02 MED ORDER — FENTANYL CITRATE (PF) 250 MCG/5ML IJ SOLN
INTRAMUSCULAR | Status: DC | PRN
Start: 2023-11-02 — End: 2023-11-02
  Administered 2023-11-02: 50 ug via INTRAVENOUS
  Administered 2023-11-02: 250 ug via INTRAVENOUS
  Administered 2023-11-02: 50 ug via INTRAVENOUS
  Administered 2023-11-02: 100 ug via INTRAVENOUS
  Administered 2023-11-02: 50 ug via INTRAVENOUS
  Administered 2023-11-02 (×2): 100 ug via INTRAVENOUS
  Administered 2023-11-02: 50 ug via INTRAVENOUS
  Administered 2023-11-02: 100 ug via INTRAVENOUS
  Administered 2023-11-02: 50 ug via INTRAVENOUS
  Administered 2023-11-02: 100 ug via INTRAVENOUS
  Administered 2023-11-02 (×2): 50 ug via INTRAVENOUS

## 2023-11-02 MED ORDER — METOPROLOL TARTRATE 12.5 MG HALF TABLET: 12.5000 mg | ORAL_TABLET | Freq: Two times a day (BID) | ORAL | Status: DC

## 2023-11-02 SURGICAL SUPPLY — 90 items
ADAPTER MULTI PERFUSION 15 (ADAPTER) ×4 IMPLANT
BAG DECANTER FOR FLEXI CONT (MISCELLANEOUS) ×4 IMPLANT
BLADE CLIPPER SURG (BLADE) ×8 IMPLANT
BLADE STERNUM SYSTEM 6 (BLADE) ×4 IMPLANT
BLADE SURG 15 STRL LF DISP TIS (BLADE) ×4 IMPLANT
BNDG ELASTIC 4INX 5YD STR LF (GAUZE/BANDAGES/DRESSINGS) IMPLANT
BNDG ELASTIC 4X5.8 VLCR STR LF (GAUZE/BANDAGES/DRESSINGS) ×8 IMPLANT
BNDG ELASTIC 6INX 5YD STR LF (GAUZE/BANDAGES/DRESSINGS) ×4 IMPLANT
BNDG GAUZE DERMACEA FLUFF 4 (GAUZE/BANDAGES/DRESSINGS) ×4 IMPLANT
CANISTER SUCTION 3000ML PPV (SUCTIONS) ×4 IMPLANT
CANNULA AORTIC ROOT 9FR (CANNULA) ×4 IMPLANT
CANNULA NON VENT 22FR 12 (CANNULA) IMPLANT
CANNULA TRIPLE STAGE 37X37X29 (MISCELLANEOUS) IMPLANT
CATH ROBINSON RED A/P 18FR (CATHETERS) ×8 IMPLANT
CLIP RETRACTION 3.0MM CORONARY (MISCELLANEOUS) IMPLANT
CLIP TI MEDIUM 24 (CLIP) IMPLANT
CLIP TI WIDE RED SMALL 24 (CLIP) IMPLANT
CONNECTOR BLAKE 2:1 CARIO BLK (MISCELLANEOUS) ×4 IMPLANT
CONTAINER PROTECT SURGISLUSH (MISCELLANEOUS) ×8 IMPLANT
COVER MAYO STAND STRL (DRAPES) ×4 IMPLANT
DERMABOND ADVANCED .7 DNX12 (GAUZE/BANDAGES/DRESSINGS) IMPLANT
DRAIN CHANNEL 19F RND (DRAIN) ×12 IMPLANT
DRAIN CONNECTOR BLAKE 1:1 (MISCELLANEOUS) IMPLANT
DRAPE EXTREMITY T 121X128X90 (DISPOSABLE) ×4 IMPLANT
DRAPE HALF SHEET 40X57 (DRAPES) ×4 IMPLANT
DRAPE INCISE IOBAN 66X45 STRL (DRAPES) IMPLANT
DRAPE SRG 135X102X78XABS (DRAPES) ×4 IMPLANT
DRAPE WARM FLUID 44X44 (DRAPES) ×4 IMPLANT
DRSG AQUACEL AG ADV 3.5X10 (GAUZE/BANDAGES/DRESSINGS) ×4 IMPLANT
ELECTRODE BLDE 4.0 EZ CLN MEGD (MISCELLANEOUS) ×4 IMPLANT
ELECTRODE REM PT RTRN 9FT ADLT (ELECTROSURGICAL) ×8 IMPLANT
FELT TEFLON 1X6 (MISCELLANEOUS) ×8 IMPLANT
GAUZE 4X4 16PLY ~~LOC~~+RFID DBL (SPONGE) ×4 IMPLANT
GAUZE SPONGE 4X4 12PLY STRL (GAUZE/BANDAGES/DRESSINGS) ×8 IMPLANT
GLOVE BIO SURGEON STRL SZ7 (GLOVE) ×8 IMPLANT
GLOVE BIOGEL M STRL SZ7.5 (GLOVE) ×8 IMPLANT
GLOVE SURG SS PI 7.5 STRL IVOR (GLOVE) IMPLANT
GOWN STRL REUS W/ TWL LRG LVL3 (GOWN DISPOSABLE) ×16 IMPLANT
GOWN STRL REUS W/ TWL XL LVL3 (GOWN DISPOSABLE) ×8 IMPLANT
HEMOSTAT POWDER SURGIFOAM 1G (HEMOSTASIS) ×8 IMPLANT
INSERT SUTURE HOLDER (MISCELLANEOUS) ×4 IMPLANT
KIT BASIN OR (CUSTOM PROCEDURE TRAY) ×4 IMPLANT
KIT SUCTION CATH 14FR (SUCTIONS) IMPLANT
KIT TURNOVER KIT B (KITS) ×8 IMPLANT
KIT VASOVIEW HEMOPRO 2 VH 4000 (KITS) ×4 IMPLANT
LEAD PACING MYOCARDI (MISCELLANEOUS) ×4 IMPLANT
MARKER DISTAL GRAFT W/ HOLDER (MISCELLANEOUS) ×12 IMPLANT
NS IRRIG 1000ML POUR BTL (IV SOLUTION) ×20 IMPLANT
OFFPUMP STABILIZER SUV (MISCELLANEOUS) IMPLANT
PACK E OPEN HEART (SUTURE) ×4 IMPLANT
PACK OPEN HEART (CUSTOM PROCEDURE TRAY) ×4 IMPLANT
PAD ARMBOARD POSITIONER FOAM (MISCELLANEOUS) ×16 IMPLANT
PAD ELECT DEFIB RADIOL ZOLL (MISCELLANEOUS) ×4 IMPLANT
PENCIL BUTTON HOLSTER BLD 10FT (ELECTRODE) ×4 IMPLANT
POSITIONER ACROBAT-I OFFPUMP (MISCELLANEOUS) IMPLANT
POSITIONER HEAD DONUT 9IN (MISCELLANEOUS) ×4 IMPLANT
PUNCH AORTIC ROTATE 4.0MM (MISCELLANEOUS) ×4 IMPLANT
SEALANT HEMOST PREVELEAK 4ML (HEMOSTASIS) IMPLANT
SET MPS 3-ND DEL (MISCELLANEOUS) IMPLANT
SHEARS HARMONIC 9CM CVD (BLADE) ×4 IMPLANT
SPONGE T-LAP 18X18 ~~LOC~~+RFID (SPONGE) ×16 IMPLANT
STOPCOCK 4 WAY LG BORE MALE ST (IV SETS) IMPLANT
SUPPORT HEART JANKE-BARRON (MISCELLANEOUS) ×4 IMPLANT
SUT BONE WAX W31G (SUTURE) IMPLANT
SUT ETHIBOND X763 2 0 SH 1 (SUTURE) ×8 IMPLANT
SUT MNCRL AB 3-0 PS2 18 (SUTURE) ×8 IMPLANT
SUT MNCRL AB 4-0 PS2 18 (SUTURE) IMPLANT
SUT PDS AB 1 CTX 36 (SUTURE) ×8 IMPLANT
SUT PROLENE 3 0 SH DA (SUTURE) IMPLANT
SUT PROLENE 4 0 SH DA (SUTURE) ×4 IMPLANT
SUT PROLENE 4-0 RB1 .5 CRCL 36 (SUTURE) IMPLANT
SUT PROLENE 5 0 C 1 36 (SUTURE) ×12 IMPLANT
SUT PROLENE 7 0 BV 1 (SUTURE) IMPLANT
SUT PROLENE 7 0 BV1 MDA (SUTURE) ×4 IMPLANT
SUT STEEL 6MS V (SUTURE) ×8 IMPLANT
SUT STEEL STERNAL CCS#1 18IN (SUTURE) IMPLANT
SUT VIC AB 2-0 CT1 TAPERPNT 27 (SUTURE) IMPLANT
SUT VIC AB 3-0 SH 27X BRD (SUTURE) IMPLANT
SYSTEM SAHARA CHEST DRAIN ATS (WOUND CARE) ×4 IMPLANT
TAPE CLOTH SURG 4X10 WHT LF (GAUZE/BANDAGES/DRESSINGS) IMPLANT
TAPE PAPER 2X10 WHT MICROPORE (GAUZE/BANDAGES/DRESSINGS) IMPLANT
TOWEL GREEN STERILE (TOWEL DISPOSABLE) ×8 IMPLANT
TOWEL GREEN STERILE FF (TOWEL DISPOSABLE) ×8 IMPLANT
TRAY FOLEY SLVR 16FR TEMP STAT (SET/KITS/TRAYS/PACK) ×4 IMPLANT
TUBE CONNECTING 20X1/4 (TUBING) IMPLANT
TUBE SUCT INTRACARD DLP 20F (MISCELLANEOUS) ×4 IMPLANT
TUBING LAP HI FLOW INSUFFLATIO (TUBING) ×4 IMPLANT
UNDERPAD 30X36 HEAVY ABSORB (UNDERPADS AND DIAPERS) ×8 IMPLANT
WATER STERILE IRR 1000ML POUR (IV SOLUTION) ×8 IMPLANT
YANKAUER SUCT BULB TIP NO VENT (SUCTIONS) IMPLANT

## 2023-11-02 NOTE — Progress Notes (Addendum)
 Advanced Heart Failure Rounding Note  Cardiologist: Alvan Carrier, MD  Chief Complaint: NSTEMI Subjective:    S/p CABGx3 11/02/23: (LIMA-LAD, Rt Radial Artery- PDA, SVG-PLOM)  Currently on Levo @2 , DBA @ 2.5, and Cardene @ 1.   Flotrac: CO 6, CI 2.5, SVR 853  Intubated and sedated. Just returned from OR.   Objective:   Weight Range: (P) 122 kg Body mass index is 36.48 kg/m (pended).   Vital Signs:   Temp:  [98 F (36.7 C)-98.6 F (37 C)] 98.4 F (36.9 C) (07/03 1600) Pulse Rate:  [75-96] 86 (07/03 1600) Resp:  [10-26] 26 (07/03 1600) BP: (91-135)/(58-88) 111/83 (07/03 0723) SpO2:  [91 %-98 %] 96 % (07/03 1600) Arterial Line BP: (105-202)/(13-175) 105/62 (07/03 1600) FiO2 (%):  [50 %] 50 % (07/03 1523) Weight:  [122 kg] (P) 122 kg (07/03 0616) Last BM Date : 11/01/23  Weight change: Filed Weights   10/29/23 0437 11/01/23 0900 11/02/23 0616  Weight: 122.2 kg 122 kg (P) 122 kg   Intake/Output:   Intake/Output Summary (Last 24 hours) at 11/02/2023 1601 Last data filed at 11/02/2023 1526 Gross per 24 hour  Intake 5819.88 ml  Output 2516 ml  Net 3303.88 ml    Physical Exam  General:  critically ill appearing.   HEENT: +ETT, +OG Neck: RIJ CVC,  Cor: PMI nondisplaced. Regular rate & rhythm. No rubs, gallops or murmurs. +CT Lungs: diminished GU: + foley Extremities: no cyanosis, clubbing, rash, edema  Neuro: intubated and sedated  Telemetry   NSR 80s (Personally reviewed)    EKG    No new EKG to review  Labs    CBC Recent Labs    11/01/23 0222 11/02/23 0438 11/02/23 0818 11/02/23 1248 11/02/23 1255 11/02/23 1404 11/02/23 1408  WBC 11.1* 11.6*  --   --   --   --   --   HGB 14.8 17.0   < > 12.7*   < > 10.5* 11.6*  HCT 44.5 50.8   < > 37.4*   < > 31.0* 34.0*  MCV 94.7 95.5  --   --   --   --   --   PLT 254 249  --  206  --   --   --    < > = values in this interval not displayed.   Basic Metabolic Panel Recent Labs    92/97/74 0222  11/02/23 0438 11/02/23 0818 11/02/23 1309 11/02/23 1404 11/02/23 1408  NA 141 136   < > 135 138 137  K 3.8 4.1   < > 5.6* 4.8 4.8  CL 108 101   < > 103  --  105  CO2 22 22  --   --   --   --   GLUCOSE 111* 104*   < > 132*  --  144*  BUN 8 13   < > 13  --  11  CREATININE 0.74 0.79   < > 0.70  --  0.70  CALCIUM  8.9 9.4  --   --   --   --   MG 2.2 1.9  --   --   --   --    < > = values in this interval not displayed.   Liver Function Tests No results for input(s): AST, ALT, ALKPHOS, BILITOT, PROT, ALBUMIN in the last 72 hours. No results for input(s): LIPASE, AMYLASE in the last 72 hours. Cardiac Enzymes No results for input(s): CKTOTAL, CKMB, CKMBINDEX, TROPONINI in the last  72 hours.  BNP: BNP (last 3 results) No results for input(s): BNP in the last 8760 hours.  ProBNP (last 3 results) No results for input(s): PROBNP in the last 8760 hours.   D-Dimer No results for input(s): DDIMER in the last 72 hours. Hemoglobin A1C Recent Labs    10/31/23 0846  HGBA1C 5.2   Fasting Lipid Panel No results for input(s): CHOL, HDL, LDLCALC, TRIG, CHOLHDL, LDLDIRECT in the last 72 hours.  Thyroid  Function Tests No results for input(s): TSH, T4TOTAL, T3FREE, THYROIDAB in the last 72 hours.  Invalid input(s): FREET3  Other results:   Imaging    ECHO INTRAOPERATIVE TEE Result Date: 11/02/2023  *INTRAOPERATIVE TRANSESOPHAGEAL REPORT *  Patient Name:   Brian Caldwell Date of Exam: 11/02/2023 Medical Rec #:  [id number]         Height:       72.0 in Accession #:    [id number]        Weight:       269.0 lb Date of Birth:  1976/01/30          BSA:          2.42 m Patient Age:    48 years          BP:           113/67 mmHg Patient Gender: M                 HR:           80 bpm. Exam Location:  Inpatient Transesophogeal exam was perform intraoperatively during surgical procedure. Patient was closely monitored under general anesthesia during  the entirety of examination. Indications:     CABG Performing Phys: [id number] LINNIE KIDD LIGHTFOOT Diagnosing Phys: Elsie Needle MD Complications: No known complications during this procedure. POST-OP IMPRESSIONS _ Left Ventricle: LV function has improved somewhat with a post bypass EF of 40%. _ Right Ventricle: The right ventricle appears unchanged from pre-bypass. _ Left Atrial Appendage: The left atrial appendage appears unchanged from pre-bypass. _ Aortic Valve: The aortic valve appears unchanged from pre-bypass. _ Mitral Valve: The mitral valve appears unchanged from pre-bypass. _ Tricuspid Valve: The tricuspid valve appears unchanged from pre-bypass. _ Pulmonic Valve: The pulmonic valve appears unchanged from pre-bypass. PRE-OP FINDINGS  Left Ventricle: The left ventricle has moderate-severely reduced systolic function, with an ejection fraction of 30-35%. The cavity size was mildly dilated. There is no increase in left ventricular wall thickness. There is no left ventricular hypertrophy.  LV Wall Scoring: The mid inferoseptal segment and basal inferoseptal segment are dyskinetic. The entire inferior wall, mid inferolateral segment, apical septal segment, and apex are hypokinetic. The entire anterior wall, antero-lateral wall, anterior septum, basal inferolateral segment, and apical lateral segment are normal.  Right Ventricle: The right ventricle has normal systolic function. The cavity was normal. There is no increase in right ventricular wall thickness. Left Atrium: Left atrial size was not assessed. No left atrial/left atrial appendage thrombus was detected. Right Atrium: Right atrial size was not assessed. Interatrial Septum: No atrial level shunt detected by color flow Doppler. Pericardium: There is no evidence of pericardial effusion. Mitral Valve: The mitral valve is normal in structure. Mitral valve regurgitation is trivial by color flow Doppler. Tricuspid Valve: The tricuspid valve was normal in  structure. Tricuspid valve regurgitation is trivial by color flow Doppler. Aortic Valve: The aortic valve is tricuspid Aortic valve regurgitation was not visualized by color flow Doppler. Pulmonic Valve: The pulmonic valve  was normal in structure. Pulmonic valve regurgitation is trivial by color flow Doppler. +--------------+-------++ LEFT VENTRICLE        +--------------+-------++ PLAX 2D               +--------------+-------++ LVIDd:        5.30 cm +--------------+-------++ LVIDs:        4.60 cm +--------------+-------++ LV PW:        1.10 cm +--------------+-------++ LV IVS:       0.80 cm +--------------+-------++ LV SV:        38 ml   +--------------+-------++ LV SV Index:  15.00   +--------------+-------++                       +--------------+-------++  Elsie Needle MD Electronically signed by Elsie Needle MD Signature Date/Time: 11/02/2023/3:43:36 PM    Final     Medications:   Scheduled Medications:  [START ON 11/03/2023] acetaminophen   1,000 mg Oral Q6H   Or   [START ON 11/03/2023] acetaminophen  (TYLENOL ) oral liquid 160 mg/5 mL  1,000 mg Per Tube Q6H   acetaminophen  (TYLENOL ) oral liquid 160 mg/5 mL  650 mg Per Tube Once   [START ON 11/03/2023] atorvastatin   80 mg Oral Daily   [START ON 11/03/2023] bisacodyl   10 mg Oral Daily   Or   [START ON 11/03/2023] bisacodyl   10 mg Rectal Daily   [START ON 11/03/2023] docusate sodium  200 mg Oral Daily   metoCLOPramide (REGLAN) injection  10 mg Intravenous Q6H   metoprolol  tartrate  12.5 mg Oral BID   Or   metoprolol  tartrate  12.5 mg Per Tube BID   [START ON 11/04/2023] pantoprazole   40 mg Oral Daily   pantoprazole  (PROTONIX ) IV  40 mg Intravenous QHS   [START ON 11/03/2023] sodium chloride  flush  3 mL Intravenous Q12H    Infusions:  sodium chloride      sodium chloride      albumin human     dexmedetomidine (PRECEDEX) IV infusion 1 mcg/kg/hr (11/02/23 1520)   DOBUTamine 2.5 mcg/kg/min (11/02/23 1520)    insulin 1.9 Units/hr (11/02/23 1520)   lactated ringers     [START ON 11/03/2023] levofloxacin  (LEVAQUIN ) IV     magnesium sulfate 4 g (11/02/23 1546)   niCARDipine     norepinephrine (LEVOPHED) Adult infusion     potassium chloride      vancomycin       PRN Medications: sodium chloride , albumin human, dextrose , metoprolol  tartrate, midazolam , morphine  injection, ondansetron  (ZOFRAN ) IV, oxyCODONE , [START ON 11/03/2023] sodium chloride  flush, traMADol  Patient Profile   LILIANA BRENTLINGER is a 48 y.o. male with CAD with previous PCI in 5' and 16', HTN, HLD, hx of traumatic aortic dissection s/p repair and tobacco abuse. Admitted with late presenting NSTEMI.   Assessment/Plan  NSTEMI / CAD - Hx STEMI 50' s/p bare-metal stent to LAD - Hx NSTEMI 1' s/p LCx and OM stent, PTCA of jailed LCx, RCA CTO with left-to-right collaterals  - LHC 21' PTCA to LCx (ISR) and stent to LAD  - LHC this admission with subtotally occluded proximal and distal LAD with thrombus and plaque formation in and around his old LAD stents. There is ~50% ISR of the LCx/OM stent as well and CTO of the RCA. Now s/p angioplasty of the proximal and distal LAD and restore flow. Unable to stent anything - Echo 6/30: EF 30-35%, LV with RWMA, RV normal, trivial MR - S/p CABGx3 11/02/23: (LIMA-LAD, Rt Radial Artery- PDA, SVG-PLOM) -  Continue statin. No ASA with allergy - TCTS following  2. HFrEF, iCM - Echo 6/30: EF 30-35%, LV with RWMA, RV normal, trivial MR - NYHA IV on admission - Volume stable, suspect he will need diuretics tomorrow - Hold off on ACE/ARB/MRA with pressor support - A1c 5.2. Plan for SGLT2i once foley removed - Currently on 2 of levo, DBA @ 2.5 and Cardene @ 1 (for arterial graft)   3. HTN - SBP stable on current support - Currently off nifedipine and coreg    4. HLD - LDL 51 10/29/23 - Continue statin    5. Tobacco abuse - Still smoking 2-3 PPD - imperative to stop smoking - Nicotine  patch  6.  Headache - Scheduled tylenol   - HA had been improving improving, currently UTA - Now off nitroglycerin  gtt  CRITICAL CARE Performed by: Beckey LITTIE Coe  Total critical care time: 15 minutes  Critical care time was exclusive of separately billable procedures and treating other patients.  Critical care was necessary to treat or prevent imminent or life-threatening deterioration.  Critical care was time spent personally by me on the following activities: development of treatment plan with patient and/or surrogate as well as nursing, discussions with consultants, evaluation of patient's response to treatment, examination of patient, obtaining history from patient or surrogate, ordering and performing treatments and interventions, ordering and review of laboratory studies, ordering and review of radiographic studies, pulse oximetry and re-evaluation of patient's condition.   Length of Stay: 5  Beckey LITTIE Coe, NP  11/02/2023, 4:01 PM  Patient seen with NP, I formulated the plan and agree with the above note.   Patient is s/p LIMA-LAD, radial-PDA, SVG-PLOM today.  He is currently on NE 2, dobutamine 2.5, nicardipine 1.  CI 2.5 by Flowtrack.    General: Intubated/sedated.  Neck: No JVD, no thyromegaly or thyroid  nodule.  Lungs: Clear to auscultation bilaterally with normal respiratory effort. CV: Nondisplaced PMI.  Heart regular S1/S2, no S3/S4, no murmur.  No peripheral edema.    Abdomen: Soft, nontender, no hepatosplenomegaly, no distention.  Skin: Intact without lesions or rashes.  Neurologic: Sedated on vent.  Extremities: No clubbing or cyanosis.  HEENT: Normal.   Patient is stable on vent and pressors/inotropes s/p CABG.  Good cardiac output.  Pre-op echo with EF 30-35%.  Will slowly wean pressors to off (NE).  Continue dobutamine.  Extubate soon.   Will follow with you.   CRITICAL CARE Performed by: Ezra Shuck  Total critical care time: 35 minutes  Critical care time was  exclusive of separately billable procedures and treating other patients.  Critical care was necessary to treat or prevent imminent or life-threatening deterioration.  Critical care was time spent personally by me on the following activities: development of treatment plan with patient and/or surrogate as well as nursing, discussions with consultants, evaluation of patient's response to treatment, examination of patient, obtaining history from patient or surrogate, ordering and performing treatments and interventions, ordering and review of laboratory studies, ordering and review of radiographic studies, pulse oximetry and re-evaluation of patient's condition.  Ezra Shuck 11/02/2023 6:21 PM
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
## 2023-11-02 NOTE — Procedures (Signed)
 Extubation Procedure Note  Patient Details:   Name: Brian Caldwell DOB: 10-Jul-1975 MRN: [id number]   Airway Documentation:    Vent end date: 11/02/23 Vent end time: 1815   Evaluation  O2 sats: stable throughout Complications: No apparent complications Patient did tolerate procedure well. Bilateral Breath Sounds: Clear, Diminished   Yes, pt could speak post extubation .  Pt extubated to 4 l/m [place] per protocol.  Brian Caldwell 11/02/2023, 6:19 PM

## 2023-11-02 NOTE — Anesthesia Procedure Notes (Signed)
 Central Venous Catheter Insertion Performed by: Epifanio Fallow, MD, anesthesiologist Start/End7/07/2023 6:40 AM, 11/02/2023 6:55 AM Patient location: Pre-op. Preanesthetic checklist: patient identified, IV checked, site marked, risks and benefits discussed, surgical consent, monitors and equipment checked, pre-op evaluation, timeout performed and anesthesia consent Position: Trendelenburg Lidocaine  1% used for infiltration and patient sedated Hand hygiene performed , maximum sterile barriers used  and Seldinger technique used Catheter size: 9 Fr Total catheter length 10. Central line was placed.MAC introducer Procedure performed using ultrasound guided technique. Ultrasound Notes:anatomy identified, needle tip was noted to be adjacent to the nerve/plexus identified, no ultrasound evidence of intravascular and/or intraneural injection and image(s) printed for medical record Attempts: 1 Following insertion, line sutured, dressing applied and Biopatch. Post procedure assessment: blood return through all ports, free fluid flow and no air  Patient tolerated the procedure well with no immediate complications.

## 2023-11-02 NOTE — Anesthesia Procedure Notes (Addendum)
 Procedure Name: Intubation Date/Time: 11/02/2023 7:45 AM  Performed by: Evetta Delon BROCKS, CRNAPre-anesthesia Checklist: Patient identified, Emergency Drugs available, Suction available and Patient being monitored Patient Re-evaluated:Patient Re-evaluated prior to induction Oxygen Delivery Method: Circle system utilized Preoxygenation: Pre-oxygenation with 100% oxygen Induction Type: IV induction Ventilation: Oral airway inserted - appropriate to patient size and Two handed mask ventilation required Laryngoscope Size: Miller and 2 Grade View: Grade I Tube type: Oral Tube size: 8.0 mm Number of attempts: 1 Airway Equipment and Method: Stylet and Oral airway Placement Confirmation: ETT inserted through vocal cords under direct vision, positive ETCO2 and breath sounds checked- equal and bilateral Secured at: 23 cm Tube secured with: Tape Dental Injury: Teeth and Oropharynx as per pre-operative assessment  Comments: Intubation by [facility]

## 2023-11-02 NOTE — Brief Op Note (Signed)
 10/28/2023 - 11/02/2023  1:05 PM  PATIENT:  Brian Caldwell  48 y.o. male  PRE-OPERATIVE DIAGNOSIS:  Coronary Artery Disease  POST-OPERATIVE DIAGNOSIS:  Coronary Artery Disease  PROCEDURES:   CORONARY ARTERY BYPASS GRAFTING (CABG) TIMES THREE USING LEFT INTERNAL MAMMERY ARTERY, RIGHT RADIAL ARTERY,  AND ENDOSCOPICALLY HARVESTED RIGHT GREATER SAPHENOUS VEIN   LIMA-LAD Rt Radial Artery- PDA SVG-PLOM  RIGHT RADIAL ARTERY HARVEST  Vein harvest time: Vein prep time:  ECHOCARDIOGRAM, TRANSESOPHAGEAL   SURGEON: Lightfoot, Linnie KIDD, MD   PHYSICIAN ASSISTANT: Lindia Donald, Chambers  ASSISTANTS: Bonnie Honore BRAVO, RN, Scrub Person         Dyane Ileana DEL, RN, Scrub Person   ANESTHESIA:   general  EBL:   BLOOD ADMINISTERED:none  DRAINS: Left pleural and mediastinal drains   LOCAL MEDICATIONS USED:  NONE  COUNTS:  Correct  DICTATION: .Dragon Dictation  PLAN OF CARE: Admit to inpatient   PATIENT DISPOSITION:  ICU - intubated and hemodynamically stable.   Delay start of Pharmacological VTE agent (>24hrs) due to surgical blood loss or risk of bleeding: yes

## 2023-11-02 NOTE — Op Note (Signed)
 [address]       Brian Caldwell [address]             [phone]                                          11/02/2023 Patient:  Brian Caldwell Pre-Op Dx: NSTEMI Multivessel CAD Hx of thoracotomy HTN HLP DM    Post-op Dx:  same   pericarditis Procedure: CABG X 3.  LIMA LAD, RSVG  PLOM, right radial to PDA (Tgraft) Lysis of adhesions complicating the case by 25%  Endoscopic greater saphenous vein harvest on the right Open harvest to the right radial artery   Surgeon and Role:      * Brian Caldwell, Brian KIDD, MD - Primary    * Brian Caldwell , PA-C - assisting An experienced assistant was required given the complexity of this surgery and the standard of surgical care. The assistant was needed for exposure, dissection, suctioning, retraction of delicate tissues and sutures, instrument exchange and for overall help during this procedure.    Anesthesia  general EBL:  500ml Blood Administration: none Xclamp Time:  68 min Pump Time:   Drains: 53 F blake drain: mediastinal  X 2 Wires: none Counts: correct   Indications: 48yo male admitted with NSTEMI.  Underwent angioplasty to the LAD.  He also had significant 3V CAD.  Findings: Pericarditis.  Good distal targets.  Good radial, LIMA, and vein  Operative Technique: All invasive lines were placed in pre-op holding.  After the risks, benefits and alternatives were thoroughly discussed, the patient was brought to the operative theatre.  Anesthesia was induced, and the patient was prepped and draped in normal sterile fashion.  An appropriate surgical pause was performed, and pre-operative antibiotics were dosed accordingly.  We began with simultaneous incisions along the right leg for harvesting of the greater saphenous vein and the right forearm for the radial artery.  After this was completed a sternotomy was carried down with bovie cautery, and the sternum was divided with a reciprocating saw.   Meticulous hemostasis was obtained.  The left internal thoracic artery was exposed and harvested in in pedicled fashion.  The patient was systemically heparinized, and the artery was divided distally, and placed in a papaverine  sponge.    The sternal elevator was removed, and a retractor was placed.  The pericardium was divided in the midline and fashioned into a cradle with pericardial stitches.   After we confirmed an appropriate ACT, the ascending aorta was cannulated in standard fashion.  The right atrial appendage was used for venous cannulation site.  Cardiopulmonary bypass was initiated, and the heart retractor was placed. The cross clamp was applied, and a dose of anterograde cardioplegia was given with good arrest of the heart.  We moved to the posterior wall of the heart, and found a good target on the PDA.  An arteriotomy was made, and the radial artery graft was anastomosed to it in an end to side fashion.  Next we exposed the lateral wall, and found a good target on the PLOM.  An end to side anastomosis with the vein graft was then created.  Finally, we exposed a good target on the LAD, and fashioned an end to side anastomosis between it and the LITA.  We began to re-warm, and a re-animation dose of cardioplegia  was given.  The heart was de-aired, and the cross clamp was removed.  Meticulous hemostasis was obtained.    A partial occludding clamp was then placed on the ascending aorta, and we created an end to side anastomosis between it and the proximal vein grafts.  The radial was jumped off the vein graft.  Rings were placed on the proximal anastomosis.  Hemostasis was obtained, and we separated from cardiopulmonary bypass without event.  The heparin  was reversed with protamine .  Chest tubes and wires were placed, and the sternum was re-approximated with sternal wires.  The soft tissue and skin were re-approximated wth absorbable suture.    The patient tolerated the procedure without any  immediate complications, and was transferred to the ICU in guarded condition.  Brian Caldwell Brian Caldwell Brian Caldwell

## 2023-11-02 NOTE — Progress Notes (Signed)
     [address]       [place] [address]             [phone]       No events Vitals:   11/02/23 0500 11/02/23 0616  BP: 97/66 (P) 113/85  Pulse: 81 (P) 80  Resp: (!) 22 (P) 20  Temp:  (P) 98.3 F (36.8 C)  SpO2: 94% (P) 95%   Alert NAD Sinus EWOB  OR today for CABG and right radial artery harvest

## 2023-11-02 NOTE — Anesthesia Postprocedure Evaluation (Signed)
 Anesthesia Post Note  Patient: Brian Caldwell  Procedure(s) Performed: CORONARY ARTERY BYPASS GRAFTING (CABG) TIMES THREE USING LEFT INTERNAL MAMMERY ARTERY, RIGHT RADIAL ARTERY,  AND ENDOSCOPICALLY HARVESTED RIGHT GREATER SAPHENOUS VEIN (Chest) SURGICAL PROCUREMENT, ARTERY, RADIAL (Right: Arm Lower) ECHOCARDIOGRAM, TRANSESOPHAGEAL     Patient location during evaluation: SICU Anesthesia Type: General Level of consciousness: sedated Pain management: pain level controlled Vital Signs Assessment: post-procedure vital signs reviewed and stable Respiratory status: patient remains intubated per anesthesia plan Cardiovascular status: stable Postop Assessment: no apparent nausea or vomiting Anesthetic complications: no   There were no known notable events for this encounter.  Last Vitals:  Vitals:   11/02/23 0728 11/02/23 1523  BP:    Pulse: 76   Resp: 15 (!) 23  Temp:    SpO2: 98%     Last Pain:  Vitals:   11/02/23 0627  TempSrc:   PainSc: 0-No pain                 Dyamon Sosinski,W. EDMOND

## 2023-11-02 NOTE — Progress Notes (Signed)
 12-lead EKG performed.  Critical value noted.  Romero Shallow, RN, notified.
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
## 2023-11-02 NOTE — Anesthesia Procedure Notes (Signed)
 Arterial Line Insertion Start/End7/07/2023 7:00 AM, 11/02/2023 7:03 AM Performed by: Epifanio Fallow, MD, anesthesiologist  Patient location: Pre-op. Preanesthetic checklist: patient identified, IV checked, site marked, risks and benefits discussed, surgical consent, monitors and equipment checked, pre-op evaluation, timeout performed and anesthesia consent Lidocaine  1% used for infiltration Left, radial was placed Catheter size: 20 G Hand hygiene performed , maximum sterile barriers used  and Seldinger technique used  Attempts: 1 Procedure performed without using ultrasound guided technique. Following insertion, dressing applied and Biopatch. Post procedure assessment: normal and unchanged  Post procedure complications: unsuccessful attempts and second provider assisted. Patient tolerated the procedure well with no immediate complications.

## 2023-11-02 NOTE — Transfer of Care (Signed)
 Immediate Anesthesia Transfer of Care Note  Patient: KYAIR DITOMMASO  Procedure(s) Performed: CORONARY ARTERY BYPASS GRAFTING (CABG) TIMES THREE USING LEFT INTERNAL MAMMERY ARTERY, RIGHT RADIAL ARTERY,  AND ENDOSCOPICALLY HARVESTED RIGHT GREATER SAPHENOUS VEIN (Chest) SURGICAL PROCUREMENT, ARTERY, RADIAL (Right: Arm Lower) ECHOCARDIOGRAM, TRANSESOPHAGEAL  Patient Location: ICU  Anesthesia Type:General  Level of Consciousness: sedated and Patient remains intubated per anesthesia plan  Airway & Oxygen Therapy: Patient Spontanous Breathing and Patient placed on Ventilator (see vital sign flow sheet for setting)  Post-op Assessment: Report given to RN and Post -op Vital signs reviewed and stable  Post vital signs: Reviewed and stable  Last Vitals:  Vitals Value Taken Time  BP 126/83 11/02/23 15:23  Temp 97.8   Pulse 90 11/02/23 15:32  Resp 23 11/02/23 15:32  SpO2 93 % 11/02/23 15:26  Vitals shown include unfiled device data.     Complications: No notable events documented.

## 2023-11-03 ENCOUNTER — Inpatient Hospital Stay (HOSPITAL_COMMUNITY)

## 2023-11-03 DIAGNOSIS — I10 Essential (primary) hypertension: Secondary | ICD-10-CM | POA: Diagnosis not present

## 2023-11-03 DIAGNOSIS — Z951 Presence of aortocoronary bypass graft: Secondary | ICD-10-CM

## 2023-11-03 DIAGNOSIS — I214 Non-ST elevation (NSTEMI) myocardial infarction: Secondary | ICD-10-CM | POA: Diagnosis not present

## 2023-11-03 DIAGNOSIS — I5022 Chronic systolic (congestive) heart failure: Secondary | ICD-10-CM | POA: Diagnosis not present

## 2023-11-03 LAB — BASIC METABOLIC PANEL WITH GFR
Anion gap: 6 (ref 5–15)
Anion gap: 9 (ref 5–15)
BUN: 11 mg/dL (ref 6–20)
BUN: 13 mg/dL (ref 6–20)
CO2: 22 mmol/L (ref 22–32)
CO2: 26 mmol/L (ref 22–32)
Calcium: 7.8 mg/dL — ABNORMAL LOW (ref 8.9–10.3)
Calcium: 8.7 mg/dL — ABNORMAL LOW (ref 8.9–10.3)
Chloride: 102 mmol/L (ref 98–111)
Chloride: 96 mmol/L — ABNORMAL LOW (ref 98–111)
Creatinine, Ser: 0.8 mg/dL (ref 0.61–1.24)
Creatinine, Ser: 0.97 mg/dL (ref 0.61–1.24)
GFR, Estimated: >60 mL/min (ref >60)
GFR, Estimated: >60 mL/min (ref >60)
Glucose, Bld: 111 mg/dL — ABNORMAL HIGH (ref 70–99)
Glucose, Bld: 137 mg/dL — ABNORMAL HIGH (ref 70–99)
Potassium: 4.7 mmol/L (ref 3.5–5.1)
Potassium: 4.4 mmol/L (ref 3.5–5.1)
Sodium: 130 mmol/L — ABNORMAL LOW (ref 135–145)
Sodium: 131 mmol/L — ABNORMAL LOW (ref 135–145)

## 2023-11-03 LAB — COOXEMETRY PANEL
Carboxyhemoglobin: 2 % — ABNORMAL HIGH (ref 0.5–1.5)
Methemoglobin: <0.7 % (ref 0.0–1.5)
O2 Saturation: 70.5 %
Total hemoglobin: 14.1 g/dL (ref 12.0–16.0)

## 2023-11-03 LAB — CBC
HCT: 41.7 % (ref 39.0–52.0)
HCT: 42.3 % (ref 39.0–52.0)
Hemoglobin: 14.1 g/dL (ref 13.0–17.0)
Hemoglobin: 14.2 g/dL (ref 13.0–17.0)
MCH: 32 pg (ref 26.0–34.0)
MCH: 31.8 pg (ref 26.0–34.0)
MCHC: 33.8 g/dL (ref 30.0–36.0)
MCHC: 33.6 g/dL (ref 30.0–36.0)
MCV: 94.8 fL (ref 80.0–100.0)
MCV: 94.6 fL (ref 80.0–100.0)
Platelets: 150 K/uL (ref 150–400)
Platelets: 132 K/uL — ABNORMAL LOW (ref 150–400)
RBC: 4.4 MIL/uL (ref 4.22–5.81)
RBC: 4.47 MIL/uL (ref 4.22–5.81)
RDW: 12.8 % (ref 11.5–15.5)
RDW: 13 % (ref 11.5–15.5)
WBC: 13.7 K/uL — ABNORMAL HIGH (ref 4.0–10.5)
WBC: 15.8 K/uL — ABNORMAL HIGH (ref 4.0–10.5)
nRBC: 0 % (ref 0.0–0.2)
nRBC: 0 % (ref 0.0–0.2)

## 2023-11-03 LAB — GLUCOSE, CAPILLARY
Glucose-Capillary: 108 mg/dL — ABNORMAL HIGH (ref 70–99)
Glucose-Capillary: 113 mg/dL — ABNORMAL HIGH (ref 70–99)
Glucose-Capillary: 123 mg/dL — ABNORMAL HIGH (ref 70–99)
Glucose-Capillary: 126 mg/dL — ABNORMAL HIGH (ref 70–99)
Glucose-Capillary: 141 mg/dL — ABNORMAL HIGH (ref 70–99)
Glucose-Capillary: 174 mg/dL — ABNORMAL HIGH (ref 70–99)

## 2023-11-03 LAB — MAGNESIUM
Magnesium: 2.2 mg/dL (ref 1.7–2.4)
Magnesium: 2.1 mg/dL (ref 1.7–2.4)

## 2023-11-03 MED ORDER — ENOXAPARIN SODIUM 40 MG/0.4ML IJ SOSY
40.0000 mg | PREFILLED_SYRINGE | Freq: Every day | INTRAMUSCULAR | Status: DC
  Administered 2023-11-03 – 2023-11-09 (×7): 40 mg via SUBCUTANEOUS
  Filled 2023-11-03 (×7): qty 0.4

## 2023-11-03 MED ORDER — POLYETHYLENE GLYCOL 3350 17 G PO PACK
17.0000 g | PACK | Freq: Every day | ORAL | Status: DC
  Administered 2023-11-03 – 2023-11-08 (×6): 17 g via ORAL
  Filled 2023-11-03 (×7): qty 1

## 2023-11-03 MED ORDER — FUROSEMIDE 10 MG/ML IJ SOLN
40.0000 mg | Freq: Two times a day (BID) | INTRAMUSCULAR | Status: AC
  Administered 2023-11-03 (×2): 40 mg via INTRAVENOUS
  Filled 2023-11-03 (×2): qty 4

## 2023-11-03 MED ORDER — NICOTINE 21 MG/24HR TD PT24
21.0000 mg | MEDICATED_PATCH | Freq: Every day | TRANSDERMAL | Status: DC
  Administered 2023-11-03 – 2023-11-10 (×8): 21 mg via TRANSDERMAL
  Filled 2023-11-03 (×8): qty 1

## 2023-11-03 MED ORDER — FUROSEMIDE 10 MG/ML IJ SOLN: 40.0000 mg | Freq: Once | INTRAMUSCULAR | Status: DC

## 2023-11-03 MED ORDER — ALPRAZOLAM 0.5 MG PO TABS
0.5000 mg | ORAL_TABLET | Freq: Three times a day (TID) | ORAL | Status: DC | PRN
  Administered 2023-11-03 – 2023-11-10 (×12): 0.5 mg via ORAL
  Filled 2023-11-03 (×12): qty 1

## 2023-11-03 MED ORDER — AMLODIPINE BESYLATE 5 MG PO TABS
2.5000 mg | ORAL_TABLET | Freq: Every day | ORAL | Status: DC
  Administered 2023-11-03 – 2023-11-10 (×8): 2.5 mg via ORAL
  Filled 2023-11-03 (×8): qty 1
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
## 2023-11-03 NOTE — Progress Notes (Signed)
 Patient ID: Brian Caldwell, male   DOB: 1975/05/21, 48 y.o.   MRN: [id number]  TCTS Evening rounds:  Hemodynamically stable today. CT's out. Diuresing. Sat in chair for a few hrs. Pain control is the main issue.  BMET    Component Value Date/Time   NA 131 (L) 11/03/2023 1614   K 4.4 11/03/2023 1614   CL 96 (L) 11/03/2023 1614   CO2 26 11/03/2023 1614   GLUCOSE 137 (H) 11/03/2023 1614   BUN 13 11/03/2023 1614   CREATININE 0.97 11/03/2023 1614   CALCIUM  8.7 (L) 11/03/2023 1614   GFRNONAA >60 11/03/2023 1614   CBC    Component Value Date/Time   WBC 15.8 (H) 11/03/2023 1614   RBC 4.47 11/03/2023 1614   HGB 14.2 11/03/2023 1614   HCT 42.3 11/03/2023 1614   PLT 132 (L) 11/03/2023 1614   MCV 94.6 11/03/2023 1614   MCH 31.8 11/03/2023 1614   MCHC 33.6 11/03/2023 1614   RDW 13.0 11/03/2023 1614   LYMPHSABS 3.3 06/11/2019 0315   MONOABS 0.9 06/11/2019 0315   EOSABS 0.2 06/11/2019 0315   BASOSABS 0.0 06/11/2019 0315

## 2023-11-03 NOTE — Progress Notes (Signed)
 1 Day Post-Op Procedure(s) (LRB): CORONARY ARTERY BYPASS GRAFTING (CABG) TIMES THREE USING LEFT INTERNAL MAMMERY ARTERY, RIGHT RADIAL ARTERY,  AND ENDOSCOPICALLY HARVESTED RIGHT GREATER SAPHENOUS VEIN (N/A) SURGICAL PROCUREMENT, ARTERY, RADIAL (Right) ECHOCARDIOGRAM, TRANSESOPHAGEAL (N/A) Subjective: Complains of chest wall pain.   Objective: Vital signs in last 24 hours: Temp:  [98.4 F (36.9 C)-100.6 F (38.1 C)] 100.4 F (38 C) (07/04 0652) Pulse Rate:  [76-121] 109 (07/04 0652) Cardiac Rhythm: Sinus tachycardia (07/04 0800) Resp:  [14-42] 26 (07/04 0652) BP: (92-138)/(43-89) 138/89 (07/04 0600) SpO2:  [82 %-98 %] 91 % (07/04 0652) Arterial Line BP: (51-308)/(-1-179) 126/67 (07/04 0652) FiO2 (%):  [36 %-50 %] 36 % (07/03 1819) Weight:  [125.1 kg] 125.1 kg (07/04 0500)  Hemodynamic parameters for last 24 hours: CVP:  [0 mmHg-42 mmHg] 16 mmHg CO:  [1.6 L/min-11.2 L/min] 8.2 L/min CI:  [0.6 L/min/m2-4.6 L/min/m2] 3.4 L/min/m2  Intake/Output from previous day: 07/03 0701 - 07/04 0700 In: 6398.4 [I.V.:4127.7; Blood:1075; IV Piggyback:1195.6] Out: 4831 [Urine:3175; Blood:1316; Chest Tube:340] Intake/Output this shift: Total I/O In: -  Out: 150 [Urine:150]  General appearance: alert and cooperative Neurologic: intact Heart: regular rate and rhythm Lungs: clear to auscultation bilaterally Extremities: edema moderate, right hand neurovascularly intact Wound: dressing dry  Lab Results: Recent Labs    11/02/23 2118 11/03/23 0424  WBC 16.5* 13.7*  HGB 14.8 14.1  HCT 43.1 41.7  PLT 160 150   BMET:  Recent Labs    11/02/23 1846 11/02/23 1933 11/03/23 0424  NA 136 136 130*  K 5.9* 5.3* 4.7  CL 108  --  102  CO2 21*  --  22  GLUCOSE 119*  --  111*  BUN 9  --  11  CREATININE 0.79  --  0.80  CALCIUM  7.5*  --  7.8*    PT/INR:  Recent Labs    11/02/23 1519  LABPROT 16.4*  INR 1.3*   ABG    Component Value Date/Time   PHART 7.372 11/02/2023 1933   HCO3  20.1 11/02/2023 1933   TCO2 21 (L) 11/02/2023 1933   ACIDBASEDEF 4.0 (H) 11/02/2023 1933   O2SAT 95 11/02/2023 1933   CBG (last 3)  Recent Labs    11/03/23 0010 11/03/23 0415 11/03/23 0731  GLUCAP 108* 113* 123*   CXR: ok  ECG: sinus tachy, old anteroseptal infarct, inferolateral T-wave changes  Assessment/Plan: S/P Procedure(s) (LRB): CORONARY ARTERY BYPASS GRAFTING (CABG) TIMES THREE USING LEFT INTERNAL MAMMERY ARTERY, RIGHT RADIAL ARTERY,  AND ENDOSCOPICALLY HARVESTED RIGHT GREATER SAPHENOUS VEIN (N/A) SURGICAL PROCUREMENT, ARTERY, RADIAL (Right) ECHOCARDIOGRAM, TRANSESOPHAGEAL (N/A)  POD 1 Hemodynamically stable in sinus rhythm. He is on dobut 2.5 with EF 40% post bypass, 30-35% pre bypass. AHF team following and will decide about stopping this. CI is 3.7 by Flowtrack.   Up to chair and then remove chest tubes is drainage remains low.  Start diuresis.  Glucose under good control on SSI. Preop Hgb A1c 5.2  Hx of heavy smoking. Nicotine  patch and pulmonary toilet, IS.  Norvasc  2.5 for radial artery graft.   Mobilize.   LOS: 6 days    Brian Caldwell 11/03/2023
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
## 2023-11-04 ENCOUNTER — Inpatient Hospital Stay (HOSPITAL_COMMUNITY)

## 2023-11-04 DIAGNOSIS — I5022 Chronic systolic (congestive) heart failure: Secondary | ICD-10-CM | POA: Diagnosis not present

## 2023-11-04 DIAGNOSIS — Z951 Presence of aortocoronary bypass graft: Secondary | ICD-10-CM | POA: Diagnosis not present

## 2023-11-04 DIAGNOSIS — I214 Non-ST elevation (NSTEMI) myocardial infarction: Secondary | ICD-10-CM | POA: Diagnosis not present

## 2023-11-04 DIAGNOSIS — I10 Essential (primary) hypertension: Secondary | ICD-10-CM | POA: Diagnosis not present

## 2023-11-04 LAB — BASIC METABOLIC PANEL WITH GFR
Anion gap: 9 (ref 5–15)
BUN: 13 mg/dL (ref 6–20)
CO2: 28 mmol/L (ref 22–32)
Calcium: 8.6 mg/dL — ABNORMAL LOW (ref 8.9–10.3)
Chloride: 93 mmol/L — ABNORMAL LOW (ref 98–111)
Creatinine, Ser: 0.81 mg/dL (ref 0.61–1.24)
GFR, Estimated: >60 mL/min (ref >60)
Glucose, Bld: 136 mg/dL — ABNORMAL HIGH (ref 70–99)
Potassium: 4.3 mmol/L (ref 3.5–5.1)
Sodium: 130 mmol/L — ABNORMAL LOW (ref 135–145)

## 2023-11-04 LAB — GLUCOSE, CAPILLARY
Glucose-Capillary: 120 mg/dL — ABNORMAL HIGH (ref 70–99)
Glucose-Capillary: 129 mg/dL — ABNORMAL HIGH (ref 70–99)
Glucose-Capillary: 137 mg/dL — ABNORMAL HIGH (ref 70–99)
Glucose-Capillary: 144 mg/dL — ABNORMAL HIGH (ref 70–99)
Glucose-Capillary: 144 mg/dL — ABNORMAL HIGH (ref 70–99)
Glucose-Capillary: 166 mg/dL — ABNORMAL HIGH (ref 70–99)

## 2023-11-04 LAB — CBC
HCT: 37.2 % — ABNORMAL LOW (ref 39.0–52.0)
Hemoglobin: 12.8 g/dL — ABNORMAL LOW (ref 13.0–17.0)
MCH: 32.8 pg (ref 26.0–34.0)
MCHC: 34.4 g/dL (ref 30.0–36.0)
MCV: 95.4 fL (ref 80.0–100.0)
Platelets: 139 K/uL — ABNORMAL LOW (ref 150–400)
RBC: 3.9 MIL/uL — ABNORMAL LOW (ref 4.22–5.81)
RDW: 13.2 % (ref 11.5–15.5)
WBC: 18.1 K/uL — ABNORMAL HIGH (ref 4.0–10.5)
nRBC: 0 % (ref 0.0–0.2)

## 2023-11-04 LAB — COOXEMETRY PANEL
Carboxyhemoglobin: 2.1 % — ABNORMAL HIGH (ref 0.5–1.5)
Methemoglobin: <0.7 % (ref 0.0–1.5)
O2 Saturation: 67.2 %
Total hemoglobin: 13.1 g/dL (ref 12.0–16.0)

## 2023-11-04 MED ORDER — CLOPIDOGREL BISULFATE 75 MG PO TABS
75.0000 mg | ORAL_TABLET | Freq: Every day | ORAL | Status: DC
  Administered 2023-11-04 – 2023-11-10 (×7): 75 mg via ORAL
  Filled 2023-11-04 (×7): qty 1

## 2023-11-04 MED ORDER — LEVALBUTEROL HCL 0.63 MG/3ML IN NEBU
0.6300 mg | INHALATION_SOLUTION | Freq: Four times a day (QID) | RESPIRATORY_TRACT | Status: DC
  Administered 2023-11-04: 0.63 mg via RESPIRATORY_TRACT
  Filled 2023-11-04: qty 3

## 2023-11-04 MED ORDER — METOLAZONE 2.5 MG PO TABS
2.5000 mg | ORAL_TABLET | Freq: Once | ORAL | Status: AC
  Administered 2023-11-04: 2.5 mg via ORAL
  Filled 2023-11-04: qty 1

## 2023-11-04 MED ORDER — FUROSEMIDE 10 MG/ML IJ SOLN
40.0000 mg | Freq: Two times a day (BID) | INTRAMUSCULAR | Status: AC
  Administered 2023-11-04 (×2): 40 mg via INTRAVENOUS
  Filled 2023-11-04 (×2): qty 4

## 2023-11-04 MED ORDER — GUAIFENESIN ER 600 MG PO TB12
600.0000 mg | ORAL_TABLET | Freq: Two times a day (BID) | ORAL | Status: DC
  Administered 2023-11-04 – 2023-11-10 (×13): 600 mg via ORAL
  Filled 2023-11-04 (×13): qty 1

## 2023-11-04 MED ORDER — KETOROLAC TROMETHAMINE 15 MG/ML IJ SOLN
15.0000 mg | Freq: Four times a day (QID) | INTRAMUSCULAR | Status: AC | PRN
  Administered 2023-11-04 – 2023-11-07 (×10): 15 mg via INTRAVENOUS
  Filled 2023-11-04 (×10): qty 1

## 2023-11-04 MED ORDER — LEVALBUTEROL HCL 0.63 MG/3ML IN NEBU: 0.6300 mg | INHALATION_SOLUTION | Freq: Four times a day (QID) | RESPIRATORY_TRACT | Status: DC | PRN

## 2023-11-04 MED ORDER — INSULIN ASPART 100 UNIT/ML IJ SOLN
0.0000 [IU] | Freq: Three times a day (TID) | INTRAMUSCULAR | Status: DC
  Administered 2023-11-04 – 2023-11-06 (×6): 2 [IU] via SUBCUTANEOUS

## 2023-11-04 NOTE — Progress Notes (Signed)
 Patient ID: Brian Caldwell, male   DOB: 06/09/1975, 48 y.o.   MRN: [id number]  TCTS Evening Rounds:  Hemodynamically stable in sinus rhythm.  Received 40 IV lasix  and 2.5 metolazone  this am and did not diurese much. I=O so far today. To receive another dose of lasix  this evening.  Sats 92%.

## 2023-11-04 NOTE — Evaluation (Signed)
 Occupational Therapy Evaluation Patient Details Name: Brian Caldwell MRN: [id number] DOB: [birth year] Today's Date: 11/04/2023   History of Present Illness   48 yo M admitted 6/28; who presented to [facility] 6/28 with chest pain,  w NSTEMI and transferred to [facility]. Cathed 6/30, unable to stent. S/p CABGx3 11/02/23 and lysis of adhesions. PMH CAD, ASA allergy, HTN, HLD, ICM.     Clinical Impressions PTA, pt lived with family and reports being independent in ADL and IADL, was not working. Per wife, someone can be with patient at all times at dc. Upon eval, pt presents with decreased cardiopulmonary status, decreased activity tolerance, flat affect, decreased memory and knowledge of precautions. Pt needing up to mod A for transfers, min A for UB ADL and mod-max A for LB ADL. Due to significant change in functional status, recommending intensive multidisciplinary rehabilitation >3 hours/day to optimize safety and independence in ADL.       If plan is discharge home, recommend the following:   A lot of help with walking and/or transfers;A lot of help with bathing/dressing/bathroom;Assistance with cooking/housework;Assist for transportation;Help with stairs or ramp for entrance     Functional Status Assessment   Patient has had a recent decline in their functional status and demonstrates the ability to make significant improvements in function in a reasonable and predictable amount of time.     Equipment Recommendations   Other (comment) (defer)     Recommendations for Other Services   Rehab consult     Precautions/Restrictions   Precautions Precautions: Sternal;Fall Precaution Booklet Issued: Yes (comment) Recall of Precautions/Restrictions: Impaired Precaution/Restrictions Comments: all precautions reviewed; pt able to recall 1/5. Wife directed to ADl portion of handout and reviewed techniques with wife but focus on recall of precautuions during mobility only with pt this  session Restrictions Other Position/Activity Restrictions: sternal precautions     Mobility Bed Mobility               General bed mobility comments: in recliner    Transfers Overall transfer level: Needs assistance Equipment used: Rolling walker (2 wheels), Bilateral platform walker Transfers: Sit to/from Stand Sit to Stand: +2 physical assistance, Mod assist           General transfer comment: Mod assist +2 for boost to stand, holding pillow for comfort. Cues for technique, especially to forward weight shift. min assist to control descent into chair.      Balance Overall balance assessment: Needs assistance Sitting-balance support: No upper extremity supported, Feet supported Sitting balance-Leahy Scale: Fair Sitting balance - Comments: Needs cued not to pull forward with UEs when sitting in recliner. Needs min assist to lean foward.   Standing balance support: No upper extremity supported Standing balance-Leahy Scale: Fair                             ADL either performed or assessed with clinical judgement   ADL Overall ADL's : Needs assistance/impaired Eating/Feeding: Set up;Sitting   Grooming: Set up;Sitting   Upper Body Bathing: Set up;Sitting   Lower Body Bathing: Maximal assistance;Sit to/from stand   Upper Body Dressing : Minimal assistance;Sitting   Lower Body Dressing: Maximal assistance;Sit to/from stand               Functional mobility during ADLs: Minimal assistance (eva)       Vision Baseline Vision/History: 1 Wears glasses Patient Visual Report: No change from baseline Vision Assessment?: No apparent visual deficits  Perception         Praxis         Pertinent Vitals/Pain Pain Assessment Pain Assessment: Faces Faces Pain Scale: Hurts even more Pain Location: sternum, Rt leg Pain Descriptors / Indicators: Aching Pain Intervention(s): Limited activity within patient's tolerance, Monitored during session      Extremity/Trunk Assessment Upper Extremity Assessment Upper Extremity Assessment: Generalized weakness;Right hand dominant (long incision to anterior R forearm. Using functionally within precautions)   Lower Extremity Assessment Lower Extremity Assessment: Defer to PT evaluation       Communication Communication Communication: No apparent difficulties   Cognition Arousal: Alert Behavior During Therapy: Flat affect Cognition: Cognition impaired       Memory impairment (select all impairments): Short-term memory   Executive functioning impairment (select all impairments): Problem solving, Organization, Initiation OT - Cognition Comments: slowed processing                 Following commands: Impaired Following commands impaired: Only follows one step commands consistently, Follows one step commands with increased time     Cueing  General Comments   Cueing Techniques: Verbal cues;Gestural cues;Tactile cues  VSS on 4L   Exercises     Shoulder Instructions      Home Living Family/patient expects to be discharged to:: Private residence Living Arrangements: Spouse/significant other;Children Available Help at Discharge: Family;Available PRN/intermittently (wife works, has 39 y.o. daughter.) Type of Home: House Home Access: Stairs to enter Secretary/administrator of Steps: 3 Entrance Stairs-Rails: Right Home Layout: One level     Bathroom Shower/Tub: Chief Strategy Officer: Handicapped height Bathroom Accessibility:  (can fit a RW)   Home Equipment: None          Prior Functioning/Environment Prior Level of Function : Independent/Modified Independent;Driving             Mobility Comments: ind, denies falls, states he was not working PTA ADLs Comments: ind    OT Problem List: Decreased strength;Decreased activity tolerance;Impaired balance (sitting and/or standing);Decreased knowledge of use of DME or AE;Decreased knowledge of  precautions;Cardiopulmonary status limiting activity   OT Treatment/Interventions: Self-care/ADL training;Therapeutic exercise;DME and/or AE instruction;Energy conservation;Therapeutic activities;Patient/family education;Balance training;Cognitive remediation/compensation      OT Goals(Current goals can be found in the care plan section)   Acute Rehab OT Goals Patient Stated Goal: get better OT Goal Formulation: With family Time For Goal Achievement: 11/18/23 Potential to Achieve Goals: Good   OT Frequency:  Min 2X/week    Co-evaluation              AM-PAC OT 6 Clicks Daily Activity     Outcome Measure Help from another person eating meals?: A Little Help from another person taking care of personal grooming?: A Little Help from another person toileting, which includes using toliet, bedpan, or urinal?: A Lot Help from another person bathing (including washing, rinsing, drying)?: A Lot Help from another person to put on and taking off regular upper body clothing?: A Little Help from another person to put on and taking off regular lower body clothing?: A Lot 6 Click Score: 15   End of Session Equipment Utilized During Treatment: Gait belt;Oxygen;Other (comment) (EVA, heart pillow) Nurse Communication: Mobility status  Activity Tolerance: Patient tolerated treatment well Patient left: in chair;with call bell/phone within reach;with family/visitor present;with nursing/sitter in room  OT Visit Diagnosis: Unsteadiness on feet (R26.81);Muscle weakness (generalized) (M62.81);Other (comment) (decr activity tolerance)  Time: 8494-8461 OT Time Calculation (min): 33 min Charges:  OT General Charges $OT Visit: 1 Visit OT Evaluation $OT Eval Low Complexity: 1 Low OT Treatments $Therapeutic Activity: 8-22 mins  Elma JONETTA Lebron FREDERICK, OTR/L [facility] Acute Rehabilitation Office: [phone]   Elma JONETTA Lebron 11/04/2023, 5:12 PM
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
## 2023-11-04 NOTE — Progress Notes (Signed)
 2 Days Post-Op Procedure(s) (LRB): CORONARY ARTERY BYPASS GRAFTING (CABG) TIMES THREE USING LEFT INTERNAL MAMMERY ARTERY, RIGHT RADIAL ARTERY,  AND ENDOSCOPICALLY HARVESTED RIGHT GREATER SAPHENOUS VEIN (N/A) SURGICAL PROCUREMENT, ARTERY, RADIAL (Right) ECHOCARDIOGRAM, TRANSESOPHAGEAL (N/A) Subjective: Complains of pain. Has been getting max pain meds.  Objective: Vital signs in last 24 hours: Temp:  [98.2 F (36.8 C)-100.4 F (38 C)] 98.2 F (36.8 C) (07/05 0700) Pulse Rate:  [96-126] 118 (07/05 0645) Cardiac Rhythm: Sinus tachycardia (07/04 2001) Resp:  [14-28] 20 (07/05 0645) BP: (99-155)/(62-100) 119/83 (07/05 0645) SpO2:  [87 %-95 %] 90 % (07/05 0645) Arterial Line BP: (107-138)/(58-74) 109/63 (07/04 1115) Weight:  [125.1 kg] 125.1 kg (07/05 0645)  Hemodynamic parameters for last 24 hours: CVP:  [0 mmHg-18 mmHg] 16 mmHg CO:  [8.1 L/min-9.4 L/min] 8.1 L/min CI:  [3.3 L/min/m2-3.9 L/min/m2] 3.3 L/min/m2  Intake/Output from previous day: 07/04 0701 - 07/05 0700 In: 881.7 [P.O.:500; I.V.:241.6; IV Piggyback:140.1] Out: 1680 [Urine:1680] Intake/Output this shift: No intake/output data recorded.  General appearance: alert Neurologic: intact Heart: regular rate and rhythm Lungs: wheezes bilaterally Extremities: edema moderate Wound: dressing dry  Lab Results: Recent Labs    11/03/23 1614 11/04/23 0529  WBC 15.8* 18.1*  HGB 14.2 12.8*  HCT 42.3 37.2*  PLT 132* 139*   BMET:  Recent Labs    11/03/23 1614 11/04/23 0529  NA 131* 130*  K 4.4 4.3  CL 96* 93*  CO2 26 28  GLUCOSE 137* 136*  BUN 13 13  CREATININE 0.97 0.81  CALCIUM  8.7* 8.6*    PT/INR:  Recent Labs    11/02/23 1519  LABPROT 16.4*  INR 1.3*   ABG    Component Value Date/Time   PHART 7.372 11/02/2023 1933   HCO3 20.1 11/02/2023 1933   TCO2 21 (L) 11/02/2023 1933   ACIDBASEDEF 4.0 (H) 11/02/2023 1933   O2SAT 67.2 11/04/2023 0529   CBG (last 3)  Recent Labs    11/04/23 0005  11/04/23 0417 11/04/23 0728  GLUCAP 144* 144* 166*   CXR: left mid and lower lung atelectasis/air space disease  Assessment/Plan: S/P Procedure(s) (LRB): CORONARY ARTERY BYPASS GRAFTING (CABG) TIMES THREE USING LEFT INTERNAL MAMMERY ARTERY, RIGHT RADIAL ARTERY,  AND ENDOSCOPICALLY HARVESTED RIGHT GREATER SAPHENOUS VEIN (N/A) SURGICAL PROCUREMENT, ARTERY, RADIAL (Right) ECHOCARDIOGRAM, TRANSESOPHAGEAL (N/A)  POD 2  Hemodynamically stable with Co-ox 67% on dobut 2.5. I think this can be weaned off.  -798 cc yesterday. Wt unchanged. Continue IV lasix  and add metolazone .   Smoking and COPD: add xopenex  nebs for wheezing and mucinex  for secretions. Encourage IS.  Add toradol  to pain regimen. He says he takes ibuprofen  at home with no problems. He is a chronic pain pt.  Glucose under good control on SSI.   LOS: 7 days    Dorise MARLA Fellers 11/04/2023

## 2023-11-04 NOTE — Progress Notes (Signed)
 Patient ID: Brian Caldwell, male   DOB: 1976-05-02, 48 y.o.   MRN: [id number]     Advanced Heart Failure Rounding Note  Cardiologist: Alvan Carrier, MD  Chief Complaint: NSTEMI Subjective:    - Pre-op echo with EF 30-35%, normal RV.  - S/p CABGx3 11/02/23: (LIMA-LAD, Rt Radial Artery- PDA, SVG-PLOM)  Off NE, Dr. Lucas has stopped dobutamine  this morning.   Co-ox 67% CVP 12  Chest sore.   Objective:   Weight Range: 125.1 kg Body mass index is 37.41 kg/m.   Vital Signs:   Temp:  [98.2 F (36.8 C)-100 F (37.8 C)] 98.2 F (36.8 C) (07/05 0700) Pulse Rate:  [96-126] 99 (07/05 0900) Resp:  [12-28] 12 (07/05 0900) BP: (99-155)/(62-100) 120/89 (07/05 0730) SpO2:  [87 %-95 %] 91 % (07/05 0900) Arterial Line BP: (107-138)/(59-74) 109/63 (07/04 1115) Weight:  [125.1 kg] 125.1 kg (07/05 0645) Last BM Date : 11/01/23  Weight change: Filed Weights   11/02/23 0616 11/03/23 0500 11/04/23 0645  Weight: (P) 122 kg 125.1 kg 125.1 kg   Intake/Output:   Intake/Output Summary (Last 24 hours) at 11/04/2023 0910 Last data filed at 11/04/2023 0800 Gross per 24 hour  Intake 1221.68 ml  Output 1590 ml  Net -368.32 ml    Physical Exam   General: NAD Neck: JVP 10 cm, no thyromegaly or thyroid  nodule.  Lungs: Clear to auscultation bilaterally with normal respiratory effort. CV: Nondisplaced PMI.  Heart regular S1/S2, no S3/S4, no murmur.  No peripheral edema.   Abdomen: Soft, nontender, no hepatosplenomegaly, no distention.  Skin: Intact without lesions or rashes.  Neurologic: Alert and oriented x 3.  Psych: Normal affect. Extremities: No clubbing or cyanosis.  HEENT: Normal.   Telemetry   NSR 90s (Personally reviewed)    EKG    No new EKG to review  Labs    CBC Recent Labs    11/03/23 1614 11/04/23 0529  WBC 15.8* 18.1*  HGB 14.2 12.8*  HCT 42.3 37.2*  MCV 94.6 95.4  PLT 132* 139*   Basic Metabolic Panel Recent Labs    92/95/74 0424 11/03/23 1614  11/04/23 0529  NA 130* 131* 130*  K 4.7 4.4 4.3  CL 102 96* 93*  CO2 22 26 28   GLUCOSE 111* 137* 136*  BUN 11 13 13   CREATININE 0.80 0.97 0.81  CALCIUM  7.8* 8.7* 8.6*  MG 2.2 2.1  --    Liver Function Tests No results for input(s): AST, ALT, ALKPHOS, BILITOT, PROT, ALBUMIN  in the last 72 hours. No results for input(s): LIPASE, AMYLASE in the last 72 hours. Cardiac Enzymes No results for input(s): CKTOTAL, CKMB, CKMBINDEX, TROPONINI in the last 72 hours.  BNP: BNP (last 3 results) No results for input(s): BNP in the last 8760 hours.  ProBNP (last 3 results) No results for input(s): PROBNP in the last 8760 hours.   D-Dimer No results for input(s): DDIMER in the last 72 hours. Hemoglobin A1C No results for input(s): HGBA1C in the last 72 hours.  Fasting Lipid Panel No results for input(s): CHOL, HDL, LDLCALC, TRIG, CHOLHDL, LDLDIRECT in the last 72 hours.  Thyroid  Function Tests No results for input(s): TSH, T4TOTAL, T3FREE, THYROIDAB in the last 72 hours.  Invalid input(s): FREET3  Other results:   Imaging    DG Chest Port 1 View Result Date: 11/04/2023 CLINICAL DATA:  History of CABG and NSTEMI. EXAM: PORTABLE CHEST 1 VIEW COMPARISON:  11/03/2023 FINDINGS: There is a right IJ catheter with tip projecting over  the SVC. Status post median sternotomy and CABG procedure. Low lung volumes. Persistent opacities within the left mid and left lower lung which may reflect atelectasis, airspace disease and/or pleural effusion. IMPRESSION: Persistent opacities within the left mid and left lower lung which may reflect atelectasis, airspace disease and/or pleural effusion. Electronically Signed   By: Waddell Calk M.D.   On: 11/04/2023 07:52   DG CHEST PORT 1 VIEW Result Date: 11/03/2023 CLINICAL DATA:  [id number] History of ETT [id number] EXAM: PORTABLE CHEST 1 VIEW COMPARISON:  11/03/2023 FINDINGS: Right internal jugular central line  remains in place, unchanged. No endotracheal tube visualized. Prior median sternotomy. Heart and mediastinal contours within normal limits. Bilateral lower lobe airspace opacities, left greater than right, increasing since prior study. No visible effusions. No acute bony abnormality. IMPRESSION: Worsening bilateral lower lobe airspace opacities, left greater than right. Electronically Signed   By: Franky Crease M.D.   On: 11/03/2023 23:32    Medications:   Scheduled Medications:  acetaminophen   1,000 mg Oral Q6H   Or   acetaminophen  (TYLENOL ) oral liquid 160 mg/5 mL  1,000 mg Per Tube Q6H   amLODipine   2.5 mg Oral Daily   atorvastatin   80 mg Oral Daily   bisacodyl   10 mg Oral Daily   Or   bisacodyl   10 mg Rectal Daily   Chlorhexidine  Gluconate Cloth  6 each Topical Daily   docusate sodium   200 mg Oral Daily   enoxaparin  (LOVENOX ) injection  40 mg Subcutaneous QHS   furosemide   40 mg Intravenous BID   guaiFENesin   600 mg Oral BID   insulin  aspart  0-24 Units Subcutaneous TID WC   levalbuterol   0.63 mg Nebulization Q6H   metolazone   2.5 mg Oral Once   mupirocin  ointment  1 Application Nasal BID   nicotine   21 mg Transdermal Daily   pantoprazole   40 mg Oral Daily   polyethylene glycol  17 g Oral Daily    Infusions:    PRN Medications: ALPRAZolam , ketorolac , metoprolol  tartrate, ondansetron  (ZOFRAN ) IV, mouth rinse, oxyCODONE , traMADol  Patient Profile   Brian Caldwell is a 48 y.o. male with CAD with previous PCI in 38' and 16', HTN, HLD, hx of traumatic aortic dissection s/p repair and tobacco abuse. Admitted with late presenting NSTEMI.   Assessment/Plan  NSTEMI / CAD - Hx STEMI 56' s/p bare-metal stent to LAD - Hx NSTEMI 72' s/p LCx and OM stent, PTCA of jailed LCx, RCA CTO with left-to-right collaterals  - LHC 21' PTCA to LCx (ISR) and stent to LAD  - LHC this admission with subtotally occluded proximal and distal LAD with thrombus and plaque formation in and around [name]. There was ~50% ISR of the LCx/OM stent as well and CTO of the RCA. Now s/p angioplasty of the proximal and distal LAD with restoration of flow. Unable to stent anything - S/p CABGx3 11/02/23: (LIMA-LAD, Rt Radial Artery- PDA, SVG-PLOM) - Low dose amlodipine  for radial graft - Continue statin.  - No ASA with allergy. Start clopidogrel  when ok with TCTS.   2. HFrEF, iCM - Echo 6/30: EF 30-35%, LV with RWMA, RV normal, trivial MR - NYHA IV on admission - Dobutamine  stopped this morning, co-ox 67%.  - CVP 12 today, will give Lasix  40 mg IV bid + metolazone  2.5.    3. HTN - BP stable.    4. HLD - LDL 51 10/29/23 - Continue statin    5. Tobacco abuse - Still smoking  2-3 PPD - imperative to stop smoking - Nicotine  patch  6. ID - Afebrile, WBCs 18.  - Follow for now, no antibiotics at this point.   CRITICAL CARE Performed by: Ezra Shuck  Total critical care time: 35 minutes  Critical care time was exclusive of separately billable procedures and treating other patients.  Critical care was necessary to treat or prevent imminent or life-threatening deterioration.  Critical care was time spent personally by me on the following activities: development of treatment plan with patient and/or surrogate as well as nursing, discussions with consultants, evaluation of patient's response to treatment, examination of patient, obtaining history from patient or surrogate, ordering and performing treatments and interventions, ordering and review of laboratory studies, ordering and review of radiographic studies, pulse oximetry and re-evaluation of patient's condition.  Ezra Shuck 11/04/2023 9:10 AM

## 2023-11-04 NOTE — Progress Notes (Signed)

## 2023-11-04 NOTE — Evaluation (Signed)
 Physical Therapy Evaluation Patient Details Name: Brian Caldwell MRN: [id number] DOB: [birth year] Today's Date: 11/04/2023  History of Present Illness  48 yo M admitted 6/28; who presented to [facility] 6/28 with chest pain,  w NSTEMI and transferred to [facility]. Cathed 6/30, unable to stent. S/p CABGx3 11/02/23 and lysis of adhesions. PMH CAD, ASA allergy, HTN, HLD, ICM.  Clinical Impression  Patient is s/p above surgery presenting with functional limitations due to the deficits listed below (see PT Problem List). Independent, living with wife and 2 y.o. daughter PTA. He was driving, did not require an assistive device to be used, and his wife works. During evaluation, he required up to mod assist +2 for boost to stand from low recliner, min assist +2 for gait into hallway. SpO2 down to 86% on 4.5L after ambulating and returning to chair. Reviewed precautions. Patient will benefit from intensive inpatient follow-up therapy, >3 hours/day. Will progress acutely as tolerated. Patient will benefit from acute skilled PT to increase their independence and safety with mobility to facilitate discharge.         If plan is discharge home, recommend the following: A lot of help with walking and/or transfers;A lot of help with bathing/dressing/bathroom;Assistance with cooking/housework;Assist for transportation;Help with stairs or ramp for entrance   Can travel by private vehicle        Equipment Recommendations Rolling walker (2 wheels)  Recommendations for Other Services  Rehab consult    Functional Status Assessment Patient has had a recent decline in their functional status and demonstrates the ability to make significant improvements in function in a reasonable and predictable amount of time.     Precautions / Restrictions Precautions Precautions: Sternal;Fall Precaution Booklet Issued: Yes (comment) Recall of Precautions/Restrictions: Impaired      Mobility  Bed Mobility               General  bed mobility comments: in recliner    Transfers Overall transfer level: Needs assistance Equipment used: Rolling walker (2 wheels), Bilateral platform walker Transfers: Sit to/from Stand Sit to Stand: +2 physical assistance, Mod assist           General transfer comment: Mod assist +2 for boost to stand, holding pillow for comfort. Cues for technique, especially to forward weight shift. Mod assist to control descent into chair.    Ambulation/Gait Ambulation/Gait assistance: Min assist, +2 safety/equipment Gait Distance (Feet): 40 Feet Assistive device: Elyn Finder Gait Pattern/deviations: Step-through pattern, Decreased stride length, Trunk flexed, Narrow base of support Gait velocity: dec Gait velocity interpretation: <1.31 ft/sec, indicative of household ambulator   General Gait Details: Educated on techniques with upright posture and use of Eva RW to rest arms. Cues to avoid flexing foward and only light weight on UEs with elbows tucked close at side to maintain precautions. Cues for larger step length. Min assist for eva walker control and balance. +2 followed for safety with chair, and managed equipment.  Stairs            Wheelchair Mobility     Tilt Bed    Modified Rankin (Stroke Patients Only)       Balance Overall balance assessment: Needs assistance Sitting-balance support: No upper extremity supported, Feet supported Sitting balance-Leahy Scale: Fair Sitting balance - Comments: Needs cued not to pull forward with UEs when sitting in recliner. Needs min assist to lean foward.   Standing balance support: No upper extremity supported Standing balance-Leahy Scale: Fair  Pertinent Vitals/Pain Pain Assessment Pain Assessment: Faces Faces Pain Scale: Hurts even more Pain Location: sternum, Rt leg Pain Descriptors / Indicators: Aching Pain Intervention(s): Monitored during session, Repositioned, Limited activity  within patient's tolerance, Premedicated before session    Home Living Family/patient expects to be discharged to:: Private residence Living Arrangements: Spouse/significant other;Children Available Help at Discharge: Family;Available PRN/intermittently (wife works, has 47 y.o. daughter.) Type of Home: House Home Access: Stairs to enter Entrance Stairs-Rails: Right Entrance Stairs-Number of Steps: 3   Home Layout: One level Home Equipment: None      Prior Function Prior Level of Function : Independent/Modified Independent;Driving             Mobility Comments: ind, denies falls, states he was not working PTA ADLs Comments: ind     Extremity/Trunk Assessment   Upper Extremity Assessment Upper Extremity Assessment: Defer to OT evaluation    Lower Extremity Assessment Lower Extremity Assessment: Generalized weakness       Communication   Communication Communication: No apparent difficulties    Cognition Arousal: Alert Behavior During Therapy: Flat affect   PT - Cognitive impairments: Memory, Attention, Problem solving, Safety/Judgement                         Following commands: Impaired Following commands impaired: Only follows one step commands consistently, Follows one step commands with increased time     Cueing Cueing Techniques: Verbal cues, Gestural cues, Tactile cues     General Comments General comments (skin integrity, edema, etc.): SpO2 92% on 4.5L at rest, HR 99, BP 125/85. With gait, sats down to 86% on 4L. Cues for breathing techniques upon sitting due to sats slow to recover. RN present, aware.    Exercises     Assessment/Plan    PT Assessment Patient needs continued PT services  PT Problem List Decreased strength;Decreased range of motion;Decreased activity tolerance;Decreased balance;Decreased mobility;Decreased cognition;Decreased knowledge of use of DME;Decreased safety awareness;Decreased knowledge of  precautions;Cardiopulmonary status limiting activity;Obesity;Pain       PT Treatment Interventions DME instruction;Gait training;Stair training;Therapeutic activities;Functional mobility training;Therapeutic exercise;Balance training;Neuromuscular re-education;Patient/family education;Modalities    PT Goals (Current goals can be found in the Care Plan section)  Acute Rehab PT Goals Patient Stated Goal: Get well PT Goal Formulation: With patient Time For Goal Achievement: 11/18/23 Potential to Achieve Goals: Good    Frequency Min 3X/week     Co-evaluation               AM-PAC PT 6 Clicks Mobility  Outcome Measure Help needed turning from your back to your side while in a flat bed without using bedrails?: A Little Help needed moving from lying on your back to sitting on the side of a flat bed without using bedrails?: A Lot Help needed moving to and from a bed to a chair (including a wheelchair)?: A Lot Help needed standing up from a chair using your arms (e.g., wheelchair or bedside chair)?: A Lot Help needed to walk in hospital room?: A Lot Help needed climbing 3-5 steps with a railing? : A Lot 6 Click Score: 13    End of Session Equipment Utilized During Treatment: Oxygen Activity Tolerance: Patient tolerated treatment well Patient left: in chair;with call bell/phone within reach;with chair alarm set Nurse Communication: Mobility status PT Visit Diagnosis: Unsteadiness on feet (R26.81);Other abnormalities of gait and mobility (R26.89);Muscle weakness (generalized) (M62.81);Difficulty in walking, not elsewhere classified (R26.2);Pain Pain - part of body:  (sternum)  Time: 1201-1228 PT Time Calculation (min) (ACUTE ONLY): 27 min   Charges:   PT Evaluation $PT Eval Moderate Complexity: 1 Mod PT Treatments $Gait Training: 8-22 mins PT General Charges $$ ACUTE PT VISIT: 1 Visit         Leontine Roads, PT, DPT [facility]  Rehabilitation Services Physical  Therapist Office: [phone] Website: [email or web address]   Leontine GORMAN Roads 11/04/2023, 1:36 PM

## 2023-11-04 NOTE — Progress Notes (Signed)
 Pt up in chair using ibe at this time. BBS dec'd and coarse. Pt w/ weak cough. No wheezes noted. Change xopenex  nebs to q6prn.

## 2023-11-05 ENCOUNTER — Inpatient Hospital Stay (HOSPITAL_COMMUNITY)

## 2023-11-05 DIAGNOSIS — I5022 Chronic systolic (congestive) heart failure: Secondary | ICD-10-CM | POA: Diagnosis not present

## 2023-11-05 DIAGNOSIS — Z951 Presence of aortocoronary bypass graft: Secondary | ICD-10-CM | POA: Diagnosis not present

## 2023-11-05 DIAGNOSIS — I214 Non-ST elevation (NSTEMI) myocardial infarction: Secondary | ICD-10-CM | POA: Diagnosis not present

## 2023-11-05 DIAGNOSIS — I10 Essential (primary) hypertension: Secondary | ICD-10-CM | POA: Diagnosis not present

## 2023-11-05 LAB — BASIC METABOLIC PANEL WITH GFR
Anion gap: 8 (ref 5–15)
BUN: 20 mg/dL (ref 6–20)
CO2: 30 mmol/L (ref 22–32)
Calcium: 8.7 mg/dL — ABNORMAL LOW (ref 8.9–10.3)
Chloride: 92 mmol/L — ABNORMAL LOW (ref 98–111)
Creatinine, Ser: 1.17 mg/dL (ref 0.61–1.24)
GFR, Estimated: >60 mL/min (ref >60)
Glucose, Bld: 124 mg/dL — ABNORMAL HIGH (ref 70–99)
Potassium: 4.3 mmol/L (ref 3.5–5.1)
Sodium: 130 mmol/L — ABNORMAL LOW (ref 135–145)

## 2023-11-05 LAB — TYPE AND SCREEN
ABO/RH(D): O POS
Antibody Screen: NEGATIVE
Unit division: 0
Unit division: 0

## 2023-11-05 LAB — CBC
HCT: 34 % — ABNORMAL LOW (ref 39.0–52.0)
Hemoglobin: 11.6 g/dL — ABNORMAL LOW (ref 13.0–17.0)
MCH: 32.3 pg (ref 26.0–34.0)
MCHC: 34.1 g/dL (ref 30.0–36.0)
MCV: 94.7 fL (ref 80.0–100.0)
Platelets: 159 K/uL (ref 150–400)
RBC: 3.59 MIL/uL — ABNORMAL LOW (ref 4.22–5.81)
RDW: 13.3 % (ref 11.5–15.5)
WBC: 16.1 K/uL — ABNORMAL HIGH (ref 4.0–10.5)
nRBC: 0 % (ref 0.0–0.2)

## 2023-11-05 LAB — BPAM RBC
Blood Product Expiration Date: 202507282359
Blood Product Expiration Date: 202508062359
ISSUE DATE / TIME: 202507021237
ISSUE DATE / TIME: 202507021237
Unit Type and Rh: 5100
Unit Type and Rh: 5100

## 2023-11-05 LAB — COOXEMETRY PANEL
Carboxyhemoglobin: 1.7 % — ABNORMAL HIGH (ref 0.5–1.5)
Methemoglobin: <0.7 % (ref 0.0–1.5)
O2 Saturation: 55.9 %
Total hemoglobin: 11.8 g/dL — ABNORMAL LOW (ref 12.0–16.0)

## 2023-11-05 LAB — GLUCOSE, CAPILLARY
Glucose-Capillary: 147 mg/dL — ABNORMAL HIGH (ref 70–99)
Glucose-Capillary: 128 mg/dL — ABNORMAL HIGH (ref 70–99)
Glucose-Capillary: 88 mg/dL (ref 70–99)

## 2023-11-05 MED ORDER — FUROSEMIDE 10 MG/ML IJ SOLN
40.0000 mg | Freq: Two times a day (BID) | INTRAMUSCULAR | Status: AC
  Administered 2023-11-05 (×2): 40 mg via INTRAVENOUS
  Filled 2023-11-05 (×2): qty 4

## 2023-11-05 MED ORDER — METOPROLOL SUCCINATE ER 25 MG PO TB24
12.5000 mg | ORAL_TABLET | Freq: Every day | ORAL | Status: DC
  Administered 2023-11-05 – 2023-11-07 (×3): 12.5 mg via ORAL
  Filled 2023-11-05 (×3): qty 1

## 2023-11-05 MED ORDER — LEVOFLOXACIN IN D5W 750 MG/150ML IV SOLN
750.0000 mg | INTRAVENOUS | Status: DC
  Administered 2023-11-05: 750 mg via INTRAVENOUS
  Filled 2023-11-05 (×2): qty 150

## 2023-11-05 MED ORDER — METOPROLOL TARTRATE 12.5 MG HALF TABLET: 12.5000 mg | ORAL_TABLET | Freq: Two times a day (BID) | ORAL | Status: DC

## 2023-11-05 MED ORDER — POTASSIUM CHLORIDE CRYS ER 20 MEQ PO TBCR
20.0000 meq | EXTENDED_RELEASE_TABLET | Freq: Two times a day (BID) | ORAL | Status: AC
  Administered 2023-11-05 (×2): 20 meq via ORAL
  Filled 2023-11-05 (×2): qty 1

## 2023-11-05 MED ORDER — IPRATROPIUM-ALBUTEROL 0.5-2.5 (3) MG/3ML IN SOLN
3.0000 mL | Freq: Four times a day (QID) | RESPIRATORY_TRACT | Status: AC
  Administered 2023-11-05 – 2023-11-07 (×8): 3 mL via RESPIRATORY_TRACT
  Filled 2023-11-05 (×8): qty 3

## 2023-11-05 MED ORDER — METOLAZONE 5 MG PO TABS
5.0000 mg | ORAL_TABLET | Freq: Once | ORAL | Status: AC
Start: 2023-11-05 — End: 2023-11-05
  Administered 2023-11-05: 5 mg via ORAL
  Filled 2023-11-05: qty 1

## 2023-11-05 MED ORDER — SPIRONOLACTONE 12.5 MG HALF TABLET
12.5000 mg | ORAL_TABLET | Freq: Every day | ORAL | Status: DC
  Administered 2023-11-05 – 2023-11-06 (×2): 12.5 mg via ORAL
  Filled 2023-11-05 (×2): qty 1

## 2023-11-05 NOTE — Progress Notes (Addendum)
 Patient ID: Brian Caldwell, male   DOB: December 17, 1975, 48 y.o.   MRN: [id number]     Advanced Heart Failure Rounding Note  Cardiologist: Alvan Carrier, MD  Chief Complaint: NSTEMI Subjective:    - Pre-op echo with EF 30-35%, normal RV.  - S/p CABGx3 11/02/23: (LIMA-LAD, Rt Radial Artery- PDA, SVG-PLOM)  He is off pressors and inotropes. I/Os net negative 1300 cc yesterday.  Wet cough, WBCs 16.   Co-ox 56% CVP 9-10  Chest sore. Has walked in room.   Objective:   Weight Range: 125.1 kg Body mass index is 37.41 kg/m.   Vital Signs:   Temp:  [96.1 F (35.6 C)-99.1 F (37.3 C)] 98.5 F (36.9 C) (07/06 0725) Pulse Rate:  [89-110] 95 (07/06 0600) Resp:  [10-20] 11 (07/06 0600) BP: (99-133)/(66-95) 113/74 (07/06 0600) SpO2:  [86 %-99 %] 99 % (07/06 0600) Last BM Date : 11/01/23  Weight change: Filed Weights   11/02/23 0616 11/03/23 0500 11/04/23 0645  Weight: (P) 122 kg 125.1 kg 125.1 kg   Intake/Output:   Intake/Output Summary (Last 24 hours) at 11/05/2023 0914 Last data filed at 11/05/2023 0700 Gross per 24 hour  Intake 260.77 ml  Output 1610 ml  Net -1349.23 ml    Physical Exam   General: NAD Neck: JVP 8-9 cm, no thyromegaly or thyroid  nodule.  Lungs: Rhonchi bilaterally. CV: Nondisplaced PMI.  Heart regular S1/S2, no S3/S4, no murmur.  No peripheral edema.  Abdomen: Soft, nontender, no hepatosplenomegaly, no distention.  Skin: Intact without lesions or rashes.  Neurologic: Alert and oriented x 3.  Psych: Normal affect. Extremities: No clubbing or cyanosis.  HEENT: Normal.   Telemetry   NSR 90s (Personally reviewed)    EKG    No new EKG to review  Labs    CBC Recent Labs    11/04/23 0529 11/05/23 0530  WBC 18.1* 16.1*  HGB 12.8* 11.6*  HCT 37.2* 34.0*  MCV 95.4 94.7  PLT 139* 159   Basic Metabolic Panel Recent Labs    92/95/74 0424 11/03/23 1614 11/04/23 0529 11/05/23 0530  NA 130* 131* 130* 130*  K 4.7 4.4 4.3 4.3  CL 102 96* 93* 92*   CO2 22 26 28 30   GLUCOSE 111* 137* 136* 124*  BUN 11 13 13 20   CREATININE 0.80 0.97 0.81 1.17  CALCIUM  7.8* 8.7* 8.6* 8.7*  MG 2.2 2.1  --   --    Liver Function Tests No results for input(s): AST, ALT, ALKPHOS, BILITOT, PROT, ALBUMIN  in the last 72 hours. No results for input(s): LIPASE, AMYLASE in the last 72 hours. Cardiac Enzymes No results for input(s): CKTOTAL, CKMB, CKMBINDEX, TROPONINI in the last 72 hours.  BNP: BNP (last 3 results) No results for input(s): BNP in the last 8760 hours.  ProBNP (last 3 results) No results for input(s): PROBNP in the last 8760 hours.   D-Dimer No results for input(s): DDIMER in the last 72 hours. Hemoglobin A1C No results for input(s): HGBA1C in the last 72 hours.  Fasting Lipid Panel No results for input(s): CHOL, HDL, LDLCALC, TRIG, CHOLHDL, LDLDIRECT in the last 72 hours.  Thyroid  Function Tests No results for input(s): TSH, T4TOTAL, T3FREE, THYROIDAB in the last 72 hours.  Invalid input(s): FREET3  Other results:   Imaging    DG CHEST PORT 1 VIEW Result Date: 11/05/2023 CLINICAL DATA:  Status post CABG. EXAM: PORTABLE CHEST 1 VIEW COMPARISON:  11/04/2023 FINDINGS: There is artifact overlying the right upper lobe which  appears new from the previous exam and likely reflects something external to the patientThere is a right IJ catheter with tip projecting over the SVC. Status post median sternotomy. Persistent asymmetric retrocardiac opacification which appears unchanged from previous exam. Loss of the left costophrenic angle may reflect a small pleural effusion. Deformity of the left chest wall is again noted and appears unchanged. IMPRESSION: 1. Persistent asymmetric retrocardiac opacification which appears unchanged from previous exam. 2. Possible small left pleural effusion. 3. Artifact overlies the right upper lobe. Electronically Signed   By: Waddell Calk M.D.   On:  11/05/2023 07:46    Medications:   Scheduled Medications:  acetaminophen   1,000 mg Oral Q6H   Or   acetaminophen  (TYLENOL ) oral liquid 160 mg/5 mL  1,000 mg Per Tube Q6H   amLODipine   2.5 mg Oral Daily   atorvastatin   80 mg Oral Daily   bisacodyl   10 mg Oral Daily   Or   bisacodyl   10 mg Rectal Daily   Chlorhexidine  Gluconate Cloth  6 each Topical Daily   clopidogrel   75 mg Oral Daily   docusate sodium   200 mg Oral Daily   enoxaparin  (LOVENOX ) injection  40 mg Subcutaneous QHS   furosemide   40 mg Intravenous BID   guaiFENesin   600 mg Oral BID   insulin  aspart  0-24 Units Subcutaneous TID WC   ipratropium-albuterol   3 mL Nebulization Q6H   metoprolol  succinate  12.5 mg Oral Daily   mupirocin  ointment  1 Application Nasal BID   nicotine   21 mg Transdermal Daily   pantoprazole   40 mg Oral Daily   polyethylene glycol  17 g Oral Daily   potassium chloride   20 mEq Oral BID    Infusions:  levofloxacin  (LEVAQUIN ) IV       PRN Medications: ALPRAZolam , ketorolac , levalbuterol , metoprolol  tartrate, ondansetron  (ZOFRAN ) IV, mouth rinse, oxyCODONE , traMADol  Patient Profile   Brian Caldwell is a 48 y.o. male with CAD with previous PCI in 2' and 16', HTN, HLD, hx of traumatic aortic dissection s/p repair and tobacco abuse. Admitted with late presenting NSTEMI.   Assessment/Plan  NSTEMI / CAD - Hx STEMI 73' s/p bare-metal stent to LAD - Hx NSTEMI 3' s/p LCx and OM stent, PTCA of jailed LCx, RCA CTO with left-to-right collaterals  - LHC 21' PTCA to LCx (ISR) and stent to LAD  - LHC this admission with subtotally occluded proximal and distal LAD with thrombus and plaque formation in and around his old LAD stents. There was ~50% ISR of the LCx/OM stent as well and CTO of the RCA. Now s/p angioplasty of the proximal and distal LAD with restoration of flow. Unable to stent anything - S/p CABGx3 11/02/23: (LIMA-LAD, Rt Radial Artery- PDA, SVG-PLOM) - Low dose amlodipine  for radial  graft - Continue statin.  - Continue clopidogrel  (ASA allergy).   2. HFrEF, iCM - Echo 6/30: EF 30-35%, LV with RWMA, RV normal, trivial MR - NYHA IV on admission - Off pressors/inotropes, co-ox 56%.  - OK for Toprol  XL 12.5 mg daily.  - Add spironolactone  12.5 mg daily - CVP 9-10 today, will give Lasix  40 mg IV bid + metolazone  2.5 again today.  Reasonable diuresis yesterday.     3. HTN - BP stable.    4. HLD - LDL 51 10/29/23 - Continue statin    5. Tobacco abuse - Still smoking 2-3 PPD - imperative to stop smoking - Nicotine  patch  6. ID - Afebrile, WBCs 16.  -  Lungs rhonchorous with likely COPD.  Will give a course of levofloxacin .   Mobilize.    CRITICAL CARE Performed by: Ezra Shuck  Total critical care time: 35 minutes  Critical care time was exclusive of separately billable procedures and treating other patients.  Critical care was necessary to treat or prevent imminent or life-threatening deterioration.  Critical care was time spent personally by me on the following activities: development of treatment plan with patient and/or surrogate as well as nursing, discussions with consultants, evaluation of patient's response to treatment, examination of patient, obtaining history from patient or surrogate, ordering and performing treatments and interventions, ordering and review of laboratory studies, ordering and review of radiographic studies, pulse oximetry and re-evaluation of patient's condition.  Ezra Shuck 11/05/2023 9:14 AM

## 2023-11-05 NOTE — Progress Notes (Signed)
 Patient ID: Brian Caldwell, male   DOB: 1975/06/20, 48 y.o.   MRN: [id number]  TCTS Evening Rounds:  Hemodynamically stable in sinus rhythm.  Ambulated 2/3 of ICU today.  Diuresing.  Pain under better control
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
## 2023-11-05 NOTE — Progress Notes (Signed)
 3 Days Post-Op Procedure(s) (LRB): CORONARY ARTERY BYPASS GRAFTING (CABG) TIMES THREE USING LEFT INTERNAL MAMMERY ARTERY, RIGHT RADIAL ARTERY,  AND ENDOSCOPICALLY HARVESTED RIGHT GREATER SAPHENOUS VEIN (N/A) SURGICAL PROCUREMENT, ARTERY, RADIAL (Right) ECHOCARDIOGRAM, TRANSESOPHAGEAL (N/A) Subjective:  Feeling a little better. Sitting up eating breakfast. Walked short distance with PT yesterday. Pain better with Toradol   Objective: Vital signs in last 24 hours: Temp:  [96.1 F (35.6 C)-99.1 F (37.3 C)] 98.5 F (36.9 C) (07/06 0725) Pulse Rate:  [89-110] 95 (07/06 0600) Cardiac Rhythm: Sinus tachycardia (07/05 1130) Resp:  [10-20] 11 (07/06 0600) BP: (99-133)/(66-95) 113/74 (07/06 0600) SpO2:  [86 %-99 %] 99 % (07/06 0600)  Hemodynamic parameters for last 24 hours: CVP:  [4 mmHg-27 mmHg] 10 mmHg  Intake/Output from previous day: 07/05 0701 - 07/06 0700 In: 389.9 [P.O.:380; I.V.:9.9] Out: 1690 [Urine:1690] Intake/Output this shift: No intake/output data recorded.  General appearance: alert and cooperative Neurologic: intact Heart: regular rate and rhythm Lungs: rhonchi bilaterally Extremities: mild edema Wound: dressing dry  Lab Results: Recent Labs    11/04/23 0529 11/05/23 0530  WBC 18.1* 16.1*  HGB 12.8* 11.6*  HCT 37.2* 34.0*  PLT 139* 159   BMET:  Recent Labs    11/04/23 0529 11/05/23 0530  NA 130* 130*  K 4.3 4.3  CL 93* 92*  CO2 28 30  GLUCOSE 136* 124*  BUN 13 20  CREATININE 0.81 1.17  CALCIUM  8.6* 8.7*    PT/INR:  Recent Labs    11/02/23 1519  LABPROT 16.4*  INR 1.3*   ABG    Component Value Date/Time   PHART 7.372 11/02/2023 1933   HCO3 20.1 11/02/2023 1933   TCO2 21 (L) 11/02/2023 1933   ACIDBASEDEF 4.0 (H) 11/02/2023 1933   O2SAT 55.9 11/05/2023 0530   CBG (last 3)  Recent Labs    11/04/23 1548 11/04/23 1915 11/05/23 0614  GLUCAP 137* 120* 147*   CXR: persistent bilateral air space disease or  atelectasis  Assessment/Plan: S/P Procedure(s) (LRB): CORONARY ARTERY BYPASS GRAFTING (CABG) TIMES THREE USING LEFT INTERNAL MAMMERY ARTERY, RIGHT RADIAL ARTERY,  AND ENDOSCOPICALLY HARVESTED RIGHT GREATER SAPHENOUS VEIN (N/A) SURGICAL PROCUREMENT, ARTERY, RADIAL (Right) ECHOCARDIOGRAM, TRANSESOPHAGEAL (N/A)  POD 3 Hemodynamically stable with Co-ox 56% this am but sats only 93%. Will start low dose Lopressor .  -1300 cc yesterday. No wt yet today. Continue diuresis and KCL.  Smoking and COPD. Wheezing better on Xopenex  but still has rhonchi and terrible sounding rattling cough. Continue mucinex . Will ask pharmacy to add empiric antibiotic given CXR appearance, low grade temps and leukocytosis.  Continue IS, ambulation.     LOS: 8 days    Dorise MARLA Fellers 11/05/2023

## 2023-11-05 NOTE — Plan of Care (Signed)
  Problem: Education: Goal: Knowledge of General Education information will improve Description: Including pain rating scale, medication(s)/side effects and non-pharmacologic comfort measures Outcome: Progressing   Problem: Clinical Measurements: Goal: Ability to maintain clinical measurements within normal limits will improve Outcome: Progressing   Problem: Clinical Measurements: Goal: Respiratory complications will improve Outcome: Progressing   Problem: Clinical Measurements: Goal: Diagnostic test results will improve Outcome: Progressing   Problem: Health Behavior/Discharge Planning: Goal: Ability to manage health-related needs will improve Outcome: Progressing

## 2023-11-06 ENCOUNTER — Encounter (HOSPITAL_COMMUNITY): Payer: Self-pay | Admitting: Thoracic Surgery (Cardiothoracic Vascular Surgery)

## 2023-11-06 ENCOUNTER — Inpatient Hospital Stay (HOSPITAL_COMMUNITY)

## 2023-11-06 DIAGNOSIS — I214 Non-ST elevation (NSTEMI) myocardial infarction: Secondary | ICD-10-CM | POA: Diagnosis not present

## 2023-11-06 DIAGNOSIS — J9601 Acute respiratory failure with hypoxia: Secondary | ICD-10-CM

## 2023-11-06 DIAGNOSIS — I5022 Chronic systolic (congestive) heart failure: Secondary | ICD-10-CM | POA: Diagnosis not present

## 2023-11-06 DIAGNOSIS — N179 Acute kidney failure, unspecified: Secondary | ICD-10-CM

## 2023-11-06 DIAGNOSIS — E871 Hypo-osmolality and hyponatremia: Secondary | ICD-10-CM

## 2023-11-06 LAB — BASIC METABOLIC PANEL WITH GFR
Anion gap: 9 (ref 5–15)
BUN: 21 mg/dL — ABNORMAL HIGH (ref 6–20)
CO2: 33 mmol/L — ABNORMAL HIGH (ref 22–32)
Calcium: 8.8 mg/dL — ABNORMAL LOW (ref 8.9–10.3)
Chloride: 91 mmol/L — ABNORMAL LOW (ref 98–111)
Creatinine, Ser: 0.92 mg/dL (ref 0.61–1.24)
GFR, Estimated: >60 mL/min (ref >60)
Glucose, Bld: 120 mg/dL — ABNORMAL HIGH (ref 70–99)
Potassium: 3.8 mmol/L (ref 3.5–5.1)
Sodium: 133 mmol/L — ABNORMAL LOW (ref 135–145)

## 2023-11-06 LAB — CBC
HCT: 32.5 % — ABNORMAL LOW (ref 39.0–52.0)
Hemoglobin: 10.7 g/dL — ABNORMAL LOW (ref 13.0–17.0)
MCH: 32.1 pg (ref 26.0–34.0)
MCHC: 32.9 g/dL (ref 30.0–36.0)
MCV: 97.6 fL (ref 80.0–100.0)
Platelets: 214 K/uL (ref 150–400)
RBC: 3.33 MIL/uL — ABNORMAL LOW (ref 4.22–5.81)
RDW: 13.3 % (ref 11.5–15.5)
WBC: 14.1 K/uL — ABNORMAL HIGH (ref 4.0–10.5)
nRBC: 0 % (ref 0.0–0.2)

## 2023-11-06 LAB — GLUCOSE, CAPILLARY
Glucose-Capillary: 121 mg/dL — ABNORMAL HIGH (ref 70–99)
Glucose-Capillary: 141 mg/dL — ABNORMAL HIGH (ref 70–99)
Glucose-Capillary: 116 mg/dL — ABNORMAL HIGH (ref 70–99)
Glucose-Capillary: 131 mg/dL — ABNORMAL HIGH (ref 70–99)
Glucose-Capillary: 95 mg/dL (ref 70–99)
Glucose-Capillary: 123 mg/dL — ABNORMAL HIGH (ref 70–99)

## 2023-11-06 LAB — COOXEMETRY PANEL
Carboxyhemoglobin: 2.1 % — ABNORMAL HIGH (ref 0.5–1.5)
Methemoglobin: <0.7 % (ref 0.0–1.5)
O2 Saturation: 65.2 %
Total hemoglobin: 11.1 g/dL — ABNORMAL LOW (ref 12.0–16.0)

## 2023-11-06 MED ORDER — LEVOFLOXACIN 750 MG PO TABS
750.0000 mg | ORAL_TABLET | Freq: Every day | ORAL | Status: AC
  Administered 2023-11-06 – 2023-11-09 (×4): 750 mg via ORAL
  Filled 2023-11-06 (×5): qty 1

## 2023-11-06 MED ORDER — ~~LOC~~ CARDIAC SURGERY, PATIENT & FAMILY EDUCATION: Freq: Once | Status: AC

## 2023-11-06 MED ORDER — SODIUM CHLORIDE 0.9% FLUSH
3.0000 mL | Freq: Two times a day (BID) | INTRAVENOUS | Status: DC
  Administered 2023-11-06 – 2023-11-07 (×3): 3 mL via INTRAVENOUS

## 2023-11-06 MED ORDER — POTASSIUM CHLORIDE CRYS ER 20 MEQ PO TBCR
20.0000 meq | EXTENDED_RELEASE_TABLET | Freq: Once | ORAL | Status: AC
  Administered 2023-11-06: 20 meq via ORAL
  Filled 2023-11-06: qty 1

## 2023-11-06 MED ORDER — SODIUM CHLORIDE 0.9% FLUSH: 3.0000 mL | INTRAVENOUS | Status: DC | PRN

## 2023-11-06 MED ORDER — SODIUM CHLORIDE 0.9 % IV SOLN: 250.0000 mL | INTRAVENOUS | Status: DC | PRN

## 2023-11-06 MED ORDER — SPIRONOLACTONE 25 MG PO TABS
25.0000 mg | ORAL_TABLET | Freq: Every day | ORAL | Status: DC
  Administered 2023-11-07 – 2023-11-10 (×4): 25 mg via ORAL
  Filled 2023-11-06 (×4): qty 1

## 2023-11-06 MED ORDER — FUROSEMIDE 10 MG/ML IJ SOLN
80.0000 mg | Freq: Once | INTRAMUSCULAR | Status: AC
  Administered 2023-11-06: 80 mg via INTRAVENOUS
  Filled 2023-11-06: qty 8

## 2023-11-06 NOTE — Progress Notes (Addendum)
 Patient ID: Brian Caldwell, male   DOB: 08-01-1975, 48 y.o.   MRN: [id number]     Advanced Heart Failure Rounding Note  Cardiologist: Alvan Carrier, MD  Chief Complaint: NSTEMI Subjective:    - Pre-op echo with EF 30-35%, normal RV.  - S/p CABGx3 11/02/23: (LIMA-LAD, Rt Radial Artery- PDA, SVG-PLOM)  Diuresed 2.4L overnight on lasix  40 IV bid. CVP 9, remains 4 lb above pre-op. Co-ox 64% off pressors/inotropes.    Still with low-grade temps. On  Levofloxacin  for possible PNA  Sitting up in chair. Denies resting dyspnea. No CP. Feels tired.   Objective:   Weight Range: 124.2 kg Body mass index is 37.14 kg/m.   Vital Signs:   Temp:  [98.2 F (36.8 C)-100 F (37.8 C)] 98.6 F (37 C) (07/07 0600) Pulse Rate:  [87-107] 105 (07/07 0923) Resp:  [9-29] 9 (07/07 0600) BP: (98-124)/(52-102) 108/66 (07/07 0923) SpO2:  [73 %-100 %] 95 % (07/07 0600) FiO2 (%):  [36 %] 36 % (07/07 0210) Weight:  [124.2 kg] 124.2 kg (07/07 0423) Last BM Date : 11/05/23  Weight change: Filed Weights   11/03/23 0500 11/04/23 0645 11/06/23 0423  Weight: 125.1 kg 125.1 kg 124.2 kg   Intake/Output:   Intake/Output Summary (Last 24 hours) at 11/06/2023 1011 Last data filed at 11/06/2023 0600 Gross per 24 hour  Intake 150 ml  Output 2240 ml  Net -2090 ml    Physical Exam   General:  Well appearing, sitting up in chair  HEENT: normal Neck: supple. JVP 8 + RIJ CVC  Cor: PMI nondisplaced. Regular rate & rhythm. No rubs, gallops or murmurs. Lungs: decreased at basese  L >R Abdomen: obese soft, nontender, nondistended. No hepatosplenomegaly. No bruits or masses. Good bowel sounds. Extremities: no cyanosis, clubbing, rash, trace tr-1+ edema Neuro: alert & orientedx3, cranial nerves grossly intact. moves all 4 extremities w/o difficulty. Affect pleasant   Telemetry   Sinu 90-105 Personally reviewed  Labs    CBC Recent Labs    11/05/23 0530 11/06/23 0500  WBC 16.1* 14.1*  HGB 11.6* 10.7*   HCT 34.0* 32.5*  MCV 94.7 97.6  PLT 159 214   Basic Metabolic Panel Recent Labs    92/95/74 1614 11/04/23 0529 11/05/23 0530 11/06/23 0500  NA 131*   < > 130* 133*  K 4.4   < > 4.3 3.8  CL 96*   < > 92* 91*  CO2 26   < > 30 33*  GLUCOSE 137*   < > 124* 120*  BUN 13   < > 20 21*  CREATININE 0.97   < > 1.17 0.92  CALCIUM  8.7*   < > 8.7* 8.8*  MG 2.1  --   --   --    < > = values in this interval not displayed.   Liver Function Tests No results for input(s): AST, ALT, ALKPHOS, BILITOT, PROT, ALBUMIN  in the last 72 hours. No results for input(s): LIPASE, AMYLASE in the last 72 hours. Cardiac Enzymes No results for input(s): CKTOTAL, CKMB, CKMBINDEX, TROPONINI in the last 72 hours.  BNP: BNP (last 3 results) No results for input(s): BNP in the last 8760 hours.  ProBNP (last 3 results) No results for input(s): PROBNP in the last 8760 hours.   D-Dimer No results for input(s): DDIMER in the last 72 hours. Hemoglobin A1C No results for input(s): HGBA1C in the last 72 hours.  Fasting Lipid Panel No results for input(s): CHOL, HDL, LDLCALC, TRIG, CHOLHDL, LDLDIRECT  in the last 72 hours.  Thyroid  Function Tests No results for input(s): TSH, T4TOTAL, T3FREE, THYROIDAB in the last 72 hours.  Invalid input(s): FREET3  Other results:   Imaging    DG CHEST PORT 1 VIEW Result Date: 11/06/2023 CLINICAL DATA:  Status post CABG EXAM: PORTABLE CHEST 1 VIEW COMPARISON:  Prior chest x-ray obtained yesterday 11/05/2023 FINDINGS: Right IJ vascular sheath remains in unchanged position. Improved inspiratory volumes with decreased pulmonary vascular congestion. Patient is status post median sternotomy with evidence of multivessel CABG. Stable cardiomegaly. Persistent left basilar airspace opacity likely reflecting a combination of pleural fluid with atelectasis. IMPRESSION: 1. Slightly increased lung volumes with out evidence of  pulmonary edema. 2. Persistent left basilar opacity almost certainly representing a moderate pleural effusion and associated atelectasis. 3. Right IJ vascular sheath remains in place. Electronically Signed   By: Wilkie Lent M.D.   On: 11/06/2023 07:44    Medications:   Scheduled Medications:  acetaminophen   1,000 mg Oral Q6H   Or   acetaminophen  (TYLENOL ) oral liquid 160 mg/5 mL  1,000 mg Per Tube Q6H   amLODipine   2.5 mg Oral Daily   atorvastatin   80 mg Oral Daily   bisacodyl   10 mg Oral Daily   Or   bisacodyl   10 mg Rectal Daily   Chlorhexidine  Gluconate Cloth  6 each Topical Daily   clopidogrel   75 mg Oral Daily   docusate sodium   200 mg Oral Daily   enoxaparin  (LOVENOX ) injection  40 mg Subcutaneous QHS   guaiFENesin   600 mg Oral BID   insulin  aspart  0-24 Units Subcutaneous TID WC   ipratropium-albuterol   3 mL Nebulization Q6H   levofloxacin   750 mg Oral Daily   metoprolol  succinate  12.5 mg Oral Daily   mupirocin  ointment  1 Application Nasal BID   nicotine   21 mg Transdermal Daily   pantoprazole   40 mg Oral Daily   polyethylene glycol  17 g Oral Daily   sodium chloride  flush  3 mL Intravenous Q12H   [START ON 11/07/2023] spironolactone   25 mg Oral Daily    Infusions:  sodium chloride        PRN Medications: sodium chloride , ALPRAZolam , ketorolac , levalbuterol , metoprolol  tartrate, ondansetron  (ZOFRAN ) IV, mouth rinse, oxyCODONE , sodium chloride  flush, traMADol  Patient Profile   Brian Caldwell is a 48 y.o. male with CAD with previous PCI in 9' and 16', HTN, HLD, hx of traumatic aortic dissection s/p repair and tobacco abuse. Admitted with late presenting NSTEMI.   Assessment/Plan   1. NSTEMI / CAD - Hx STEMI 55' s/p bare-metal stent to LAD - Hx NSTEMI 64' s/p LCx and OM stent, PTCA of jailed LCx, RCA CTO with left-to-right collaterals  - LHC 21' PTCA to LCx (ISR) and stent to LAD  - LHC this admission with subtotally occluded proximal and distal LAD with  thrombus and plaque formation in and around his old LAD stents. There was ~50% ISR of the LCx/OM stent as well and CTO of the RCA. Now s/p angioplasty of the proximal and distal LAD with restoration of flow. Unable to stent anything - S/p CABGx3 11/02/23: (LIMA-LAD, Rt Radial Artery- PDA, SVG-PLOM) - on low dose amlodipine  for radial graft - Continue statin.  - Continue clopidogrel  (ASA allergy).  - No current s/s angina - Continue to mobilize   2. HFrEF, iCM - Echo 6/30: EF 30-35%, LV with RWMA, RV normal, trivial MR - NYHA IV on admission - Off pressors/inotropes, co-ox stable 64%   -  CVP 8-9, c/w mild volume overload on exam  - Continue Toprol  XL 12.5 mg daily.  - Increase spiro to 25 - Give lasix  80 IV x 1  - Plan SGLT2i,  Jardiance  10 mg daily, starting tomorrow  - Anticipate low dose ARB tomorrow if BP can tolerate   3. HLD - LDL 51 10/29/23 - Continue statin, atorva 80 mg     4. Tobacco abuse - Still smoking 2-3 PPD - imperative to stop smoking - Nicotine  patch  5. ID - Levofloxacin  started on 7/6 for possible COPD flare - WBC 16K -> 14.1K  - CXR with LLL atx and effusion.  Evaluated with CCM personally under u/s. -> has fluid there but anatomy complicated by prvious thoracotomy. Will focus on IS, abx and diuresis. If needs tap can re-valuate under CT guidance - D/w CCM, continue levofloxacin  x 5 days, end-date 7/11   Plan to transfer to PCU. Has 4E bed. AHF team will continue to follow   Toribio Fuel, MD  12:59 PM

## 2023-11-06 NOTE — Plan of Care (Signed)
  Problem: Education: Goal: Knowledge of General Education information will improve Description: Including pain rating scale, medication(s)/side effects and non-pharmacologic comfort measures Outcome: Not Progressing   Problem: Health Behavior/Discharge Planning: Goal: Ability to manage health-related needs will improve Outcome: Not Progressing   Problem: Clinical Measurements: Goal: Ability to maintain clinical measurements within normal limits will improve Outcome: Not Progressing Goal: Will remain free from infection Outcome: Not Progressing Goal: Diagnostic test results will improve Outcome: Not Progressing Goal: Respiratory complications will improve Outcome: Not Progressing Goal: Cardiovascular complication will be avoided Outcome: Not Progressing   Problem: Activity: Goal: Risk for activity intolerance will decrease Outcome: Not Progressing   Problem: Nutrition: Goal: Adequate nutrition will be maintained Outcome: Not Progressing   Problem: Coping: Goal: Level of anxiety will decrease Outcome: Not Progressing   Problem: Elimination: Goal: Will not experience complications related to bowel motility Outcome: Not Progressing Goal: Will not experience complications related to urinary retention Outcome: Not Progressing   Problem: Pain Managment: Goal: General experience of comfort will improve and/or be controlled Outcome: Not Progressing   Problem: Safety: Goal: Ability to remain free from injury will improve Outcome: Not Progressing   Problem: Skin Integrity: Goal: Risk for impaired skin integrity will decrease Outcome: Not Progressing   Problem: Education: Goal: Understanding of cardiac disease, CV risk reduction, and recovery process will improve Outcome: Not Progressing Goal: Individualized Educational Video(s) Outcome: Not Progressing   Problem: Activity: Goal: Ability to tolerate increased activity will improve Outcome: Not Progressing   Problem:  Cardiac: Goal: Ability to achieve and maintain adequate cardiovascular perfusion will improve Outcome: Not Progressing   Problem: Health Behavior/Discharge Planning: Goal: Ability to safely manage health-related needs after discharge will improve Outcome: Not Progressing   Problem: Education: Goal: Understanding of CV disease, CV risk reduction, and recovery process will improve Outcome: Not Progressing Goal: Individualized Educational Video(s) Outcome: Not Progressing   Problem: Activity: Goal: Ability to return to baseline activity level will improve Outcome: Not Progressing   Problem: Cardiovascular: Goal: Ability to achieve and maintain adequate cardiovascular perfusion will improve Outcome: Not Progressing Goal: Vascular access site(s) Level 0-1 will be maintained Outcome: Not Progressing   Problem: Health Behavior/Discharge Planning: Goal: Ability to safely manage health-related needs after discharge will improve Outcome: Not Progressing   Problem: Education: Goal: Will demonstrate proper wound care and an understanding of methods to prevent future damage Outcome: Not Progressing Goal: Knowledge of disease or condition will improve Outcome: Not Progressing Goal: Knowledge of the prescribed therapeutic regimen will improve Outcome: Not Progressing Goal: Individualized Educational Video(s) Outcome: Not Progressing   Problem: Activity: Goal: Risk for activity intolerance will decrease Outcome: Not Progressing   Problem: Cardiac: Goal: Will achieve and/or maintain hemodynamic stability Outcome: Not Progressing   Problem: Clinical Measurements: Goal: Postoperative complications will be avoided or minimized Outcome: Not Progressing   Problem: Respiratory: Goal: Respiratory status will improve Outcome: Not Progressing   Problem: Skin Integrity: Goal: Wound healing without signs and symptoms of infection Outcome: Not Progressing Goal: Risk for impaired skin  integrity will decrease Outcome: Not Progressing   Problem: Urinary Elimination: Goal: Ability to achieve and maintain adequate renal perfusion and functioning will improve Outcome: Not Progressing

## 2023-11-06 NOTE — Progress Notes (Addendum)
   11/06/23 1202  Vitals  Temp 97.9 F (36.6 C)  Temp Source Oral  BP (!) 149/87  MAP (mmHg) 105  BP Location Left Arm  BP Method Automatic  Patient Position (if appropriate) Lying  Pulse Rate (!) 102  Pulse Rate Source Monitor  ECG Heart Rate 100  Resp 16  Level of Consciousness  Level of Consciousness Alert  MEWS COLOR  MEWS Score Color Green  Oxygen Therapy  SpO2 94 %  O2 Device Nasal Cannula  O2 Flow Rate (L/min) 4 L/min  MEWS Score  MEWS Temp 0  MEWS Systolic 0  MEWS Pulse 0  MEWS RR 0  MEWS LOC 0  MEWS Score 0   Patient arrived from 2H,  to [id number]..vital signs obtained and ccmd made aware patient on the monitor. [id number]. Call bell with in reach. Clayton Bosserman Jessup RN
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
## 2023-11-06 NOTE — Progress Notes (Signed)
 Inpatient Rehab Coordinator Note:  I met with patient at bedside to discuss CIR recommendations and goals/expectations of CIR stay.  We reviewed 3 hrs/day of therapy, physician follow up, and average length of stay 2 weeks (dependent upon progress) with goals of mod I. Patient would like to go home if possible. Will continue to follow for now to see how he progresses and determine what he would like to do.   Rehab Admissons Coordinator Enyla [place], [place], [place] [phone]
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
## 2023-11-06 NOTE — Progress Notes (Signed)
      [address]       [place] [address]             [phone]                 4 Days Post-Op Procedure(s) (LRB): CORONARY ARTERY BYPASS GRAFTING (CABG) TIMES THREE USING LEFT INTERNAL MAMMERY ARTERY, RIGHT RADIAL ARTERY,  AND ENDOSCOPICALLY HARVESTED RIGHT GREATER SAPHENOUS VEIN (N/A) SURGICAL PROCUREMENT, ARTERY, RADIAL (Right) ECHOCARDIOGRAM, TRANSESOPHAGEAL (N/A)   Events: No events _______________________________________________________________ Vitals: BP 112/72   Pulse 92   Temp 98.6 F (37 C)   Resp (!) 9   Ht 6' (1.829 m)   Wt 124.2 kg   SpO2 95%   BMI 37.14 kg/m  Filed Weights   11/03/23 0500 11/04/23 0645 11/06/23 0423  Weight: 125.1 kg 125.1 kg 124.2 kg     - Neuro: alert NAD  - Cardiovascular: sinus  Drips: none.   CVP:  [7 mmHg-20 mmHg] 14 mmHg  - Pulm: EWOB FiO2 (%):  [36 %] 36 %  ABG    Component Value Date/Time   PHART 7.372 11/02/2023 1933   PCO2ART 34.8 11/02/2023 1933   PO2ART 80 (L) 11/02/2023 1933   HCO3 20.1 11/02/2023 1933   TCO2 21 (L) 11/02/2023 1933   ACIDBASEDEF 4.0 (H) 11/02/2023 1933   O2SAT 65.2 11/06/2023 0500    - Abd: ND - Extremity: trace edema  .Intake/Output      07/06 0701 07/07 0700 07/07 0701 07/08 0700   P.O.     I.V. (mL/kg)     IV Piggyback 150    Total Intake(mL/kg) 150 (1.2)    Urine (mL/kg/hr) 2390 (0.8)    Total Output 2390    Net -2240            _______________________________________________________________ Labs:    Latest Ref Rng & Units 11/06/2023    5:00 AM 11/05/2023    5:30 AM 11/04/2023    5:29 AM  CBC  WBC 4.0 - 10.5 K/uL 14.1  16.1  18.1   Hemoglobin 13.0 - 17.0 g/dL 89.2  88.3  87.1   Hematocrit 39.0 - 52.0 % 32.5  34.0  37.2   Platelets 150 - 400 K/uL 214  159  139       Latest Ref Rng & Units 11/06/2023    5:00 AM 11/05/2023    5:30 AM 11/04/2023    5:29 AM  CMP  Glucose 70 - 99 mg/dL 879  875  863   BUN 6 - 20 mg/dL 21  20  13    Creatinine 0.61 - 1.24  mg/dL 9.07  8.82  9.18   Sodium 135 - 145 mmol/L 133  130  130   Potassium 3.5 - 5.1 mmol/L 3.8  4.3  4.3   Chloride 98 - 111 mmol/L 91  92  93   CO2 22 - 32 mmol/L 33  30  28   Calcium  8.9 - 10.3 mg/dL 8.8  8.7  8.6     CXR: stable  _______________________________________________________________  Assessment and Plan: POD 4 s/p CABG  Neuro: pain improved CV: sinus, on S/BB.  On plavix  Pulm: IS, ambulation Renal: creat stable GI: on diet Heme: satble ID: [email or web address] Endo: SSI Dispo: floor   Zonnie Landen O Clemens Lachman 11/06/2023 7:59 AM

## 2023-11-06 NOTE — Progress Notes (Signed)
 PT Cancellation Note  Patient Details Name: Brian Caldwell MRN: [id number] DOB: 01-Jan-1976   Cancelled Treatment:    Reason Eval/Treat Not Completed: Patient declined, no reason specified  Requests therapy follow-up tomorrow. Not feeling well after lines pulled this morning and transfer to new unit. Educated on importance of mobility to maximize functional outcome and reduce risks associated with mobility. Will plan to follow-up tomorrow.  Leontine Roads, PT, DPT [facility]  Rehabilitation Services Physical Therapist Office: [phone] Website: [email or web address]  Leontine GORMAN Roads 11/06/2023, 1:50 PM

## 2023-11-07 ENCOUNTER — Inpatient Hospital Stay (HOSPITAL_COMMUNITY)

## 2023-11-07 DIAGNOSIS — I214 Non-ST elevation (NSTEMI) myocardial infarction: Secondary | ICD-10-CM | POA: Diagnosis not present

## 2023-11-07 LAB — BASIC METABOLIC PANEL WITH GFR
Anion gap: 12 (ref 5–15)
BUN: 26 mg/dL — ABNORMAL HIGH (ref 6–20)
CO2: 33 mmol/L — ABNORMAL HIGH (ref 22–32)
Calcium: 8.9 mg/dL (ref 8.9–10.3)
Chloride: 90 mmol/L — ABNORMAL LOW (ref 98–111)
Creatinine, Ser: 1.3 mg/dL — ABNORMAL HIGH (ref 0.61–1.24)
GFR, Estimated: >60 mL/min (ref >60)
Glucose, Bld: 109 mg/dL — ABNORMAL HIGH (ref 70–99)
Potassium: 3.6 mmol/L (ref 3.5–5.1)
Sodium: 135 mmol/L (ref 135–145)

## 2023-11-07 LAB — CBC
HCT: 32.3 % — ABNORMAL LOW (ref 39.0–52.0)
Hemoglobin: 10.7 g/dL — ABNORMAL LOW (ref 13.0–17.0)
MCH: 31.8 pg (ref 26.0–34.0)
MCHC: 33.1 g/dL (ref 30.0–36.0)
MCV: 95.8 fL (ref 80.0–100.0)
Platelets: 272 K/uL (ref 150–400)
RBC: 3.37 MIL/uL — ABNORMAL LOW (ref 4.22–5.81)
RDW: 13.3 % (ref 11.5–15.5)
WBC: 14.3 K/uL — ABNORMAL HIGH (ref 4.0–10.5)
nRBC: 0 % (ref 0.0–0.2)

## 2023-11-07 LAB — EXPECTORATED SPUTUM ASSESSMENT W GRAM STAIN, RFLX TO RESP C

## 2023-11-07 LAB — GLUCOSE, CAPILLARY
Glucose-Capillary: 115 mg/dL — ABNORMAL HIGH (ref 70–99)
Glucose-Capillary: 97 mg/dL (ref 70–99)
Glucose-Capillary: 92 mg/dL (ref 70–99)
Glucose-Capillary: 119 mg/dL — ABNORMAL HIGH (ref 70–99)

## 2023-11-07 MED ORDER — POTASSIUM CHLORIDE CRYS ER 20 MEQ PO TBCR
20.0000 meq | EXTENDED_RELEASE_TABLET | Freq: Two times a day (BID) | ORAL | Status: DC
  Administered 2023-11-07: 20 meq via ORAL
  Filled 2023-11-07: qty 1

## 2023-11-07 MED ORDER — FUROSEMIDE 40 MG PO TABS: 40.0000 mg | ORAL_TABLET | Freq: Every day | ORAL | Status: DC

## 2023-11-07 MED ORDER — LACTULOSE 10 GM/15ML PO SOLN
20.0000 g | Freq: Every day | ORAL | Status: DC | PRN
  Administered 2023-11-07: 20 g via ORAL
  Filled 2023-11-07: qty 30

## 2023-11-07 MED ORDER — EMPAGLIFLOZIN 10 MG PO TABS
10.0000 mg | ORAL_TABLET | Freq: Every day | ORAL | Status: DC
  Administered 2023-11-07 – 2023-11-10 (×4): 10 mg via ORAL
  Filled 2023-11-07 (×4): qty 1

## 2023-11-07 NOTE — Progress Notes (Signed)
  Inpatient Rehabilitation Admissions Coordinator   Patient not at a level to need CIR admit. Supervision 350 feet with RW. We will sign off.  Heron Leavell, RN, MSN Rehab Admissions Coordinator [phone] 11/07/2023 4:59 PM

## 2023-11-07 NOTE — Progress Notes (Addendum)
 [address] [place] [address]             [phone]      5 Days Post-Op Procedure(s) (LRB): CORONARY ARTERY BYPASS GRAFTING (CABG) TIMES THREE USING LEFT INTERNAL MAMMERY ARTERY, RIGHT RADIAL ARTERY,  AND ENDOSCOPICALLY HARVESTED RIGHT GREATER SAPHENOUS VEIN (N/A) SURGICAL PROCUREMENT, ARTERY, RADIAL (Right) ECHOCARDIOGRAM, TRANSESOPHAGEAL (N/A) Subjective: Patient states he is doing okay this AM, breathing still feels a little shallow but better than yesterday. He reports his pain is a 7/10.   Objective: Vital signs in last 24 hours: Temp:  [97.9 F (36.6 C)-99.3 F (37.4 C)] 98 F (36.7 C) (07/08 0421) Pulse Rate:  [96-117] 96 (07/08 0421) Cardiac Rhythm: Sinus tachycardia (07/07 2030) Resp:  [13-20] 17 (07/08 0421) BP: (99-149)/(59-87) 99/63 (07/08 0421) SpO2:  [90 %-98 %] 98 % (07/08 0421) Weight:  [122.2 kg] 122.2 kg (07/08 0500)  Hemodynamic parameters for last 24 hours: CVP:  [9 mmHg] 9 mmHg  Intake/Output from previous day: 07/07 0701 - 07/08 0700 In: 720 [P.O.:720] Out: 3750 [Urine:3750] Intake/Output this shift: Total I/O In: 240 [P.O.:240] Out: 800 [Urine:800]  General appearance: alert, cooperative, and no distress Neurologic: intact Heart: sinus tachycardia, no murmur Lungs: coarse rhonchi throughout Abdomen: soft, non-tender; bowel sounds normal; no masses,  no organomegaly Extremities: edema 1+ BLE Wound: Clean and dry without sign of infection  Lab Results: Recent Labs    11/06/23 0500 11/07/23 0425  WBC 14.1* 14.3*  HGB 10.7* 10.7*  HCT 32.5* 32.3*  PLT 214 272   BMET:  Recent Labs    11/06/23 0500 11/07/23 0425  NA 133* 135  K 3.8 3.6  CL 91* 90*  CO2 33* 33*  GLUCOSE 120* 109*  BUN 21* 26*  CREATININE 0.92 1.30*  CALCIUM  8.8* 8.9    PT/INR: No results for input(s): LABPROT, INR in the last 72 hours. ABG    Component Value Date/Time   PHART 7.372 11/02/2023 1933   HCO3 20.1 11/02/2023  1933   TCO2 21 (L) 11/02/2023 1933   ACIDBASEDEF 4.0 (H) 11/02/2023 1933   O2SAT 65.2 11/06/2023 0500   CBG (last 3)  Recent Labs    11/06/23 1946 11/06/23 2303 11/07/23 0621  GLUCAP 95 123* 115*    Assessment/Plan: S/P Procedure(s) (LRB): CORONARY ARTERY BYPASS GRAFTING (CABG) TIMES THREE USING LEFT INTERNAL MAMMERY ARTERY, RIGHT RADIAL ARTERY,  AND ENDOSCOPICALLY HARVESTED RIGHT GREATER SAPHENOUS VEIN (N/A) SURGICAL PROCUREMENT, ARTERY, RADIAL (Right) ECHOCARDIOGRAM, TRANSESOPHAGEAL (N/A)  Neuro: Pain overall controlled this AM, continue pain regimen  CV: Hx of NSTEMI, on Plavix . No ASA due to allergy. HFrEF, AHF team following and assisting with GDMT titration. On Toprol  XL 12.5mg  daily and spironolactone  25mg  daily. Per yesterday's note they plan to start SGLT2i and Jardiance  today as well as low dose ARB if patient will tolerated. SBP 99 this AM, BP will likely not tolerate ARB at this time. NSR-ST, HR 90s-110 with PVCs. May benefit from titration of BB once BP can tolerate.   Pulm: On empiric Levofloxacin  x 5 days for possible pneumonia vs atelectasis with fever and leukocytosis now improving. Small left pleural effusion not enough for thoracentesis and anatomy would make it difficult for thoracentesis due to previous thoracotomy. Continue Lasix . Left basilar atelectasis and low lung volumes. Encourage IS, flutter valve, ambulation. Continue nebs and mucinex . On 4L [place], wean oxygen as tolerated for O2>90%.   GI: Last BM 07/02, will add lactulose  PRN  today. Passing gas, tolerating a diet. No nausea.   Endo: No DM. CBGs controlled 95/123/115. Will d/c SSI and CBGs.   Renal: Cr 1.3, bumped from 0.92 yesterday. Likely due to aggressive diuresis, received IV Lasix  80mg  yesterday. UO 3750cc/24hrs. Under preop weight. Can likely transition to PO Lasix  at this time. K 3.6, supplement.   ID: On empiric Levofloxacin  for possible pneumonia. Leukocytosis trending down, WBC 14.3. Tmax 99.3.  Continue to monitor.   Expected postop ABLA: H/H 10.7/32.2, stable. Not clinically significant at this time.   DVT Prophylaxis: Lovenox   Dispo: CIR following but patient prefers to go home at discharge. Continue work with PT/OT, may be able to d/c with home health PT/OT once stable. Dispo planning.    LOS: 10 days    Con GORMAN [place], PA-C 11/07/2023  Agree Doing well Dispo planning  Linnie [name]

## 2023-11-07 NOTE — Progress Notes (Signed)
 Occupational Therapy Treatment Patient Details Name: Brian Caldwell MRN: [id number] DOB: [birth year] Today's Date: 11/07/2023   History of present illness 48 yo M admitted 6/28; who presented to [facility]/28 with chest pain,  w NSTEMI and transferred to [facility]. Cathed 6/30, unable to stent. S/p CABGx3 11/02/23 and lysis of adhesions. PMH CAD, ASA allergy, HTN, HLD, ICM.   OT comments  Patient is making good progress toward OT goals. Patient requires verbal cues for recall od sternal precautions (only able to recall stay within the tube. Patient completed supine to sitting EOB with CGA, sit to to stand with min  A to power up to standing position.  Once standing, patient completed functional mobility tO sink with RW and completed oral and face hygiene with Supervision s/p ADL setup.  Patient standing for approx 10 mins with no issues and then completed functional mobility with RW in hall and back into room with CGA.  Patient completed stand to sit in bedside chair with CGA.  Patient would benefit from additional OT intervention to address functional deficits in order for patient to return to PLOF.      If plan is discharge home, recommend the following:  A lot of help with walking and/or transfers;A lot of help with bathing/dressing/bathroom;Assistance with cooking/housework;Assist for transportation;Help with stairs or ramp for entrance   Equipment Recommendations  Other (comment)    Recommendations for Other Services Rehab consult    Precautions / Restrictions Precautions Precautions: Sternal;Fall Recall of Precautions/Restrictions: Impaired Precaution/Restrictions Comments:  (patient requires verbalk cues for recalling sternal precautions.  Patiient only able to recall  stay in the tube) Restrictions Weight Bearing Restrictions Per Provider Order: No Other Position/Activity Restrictions: sternal precautions       Mobility Bed Mobility Overal bed mobility: Needs Assistance (HOB  elevated) Bed Mobility: Supine to Sit     Supine to sit: Contact guard          Transfers Overall transfer level: Needs assistance Equipment used: Rolling walker (2 wheels), Bilateral platform walker Transfers: Sit to/from Stand Sit to Stand: Min assist (to power up to standing position)                 Balance Overall balance assessment: Needs assistance Sitting-balance support: No upper extremity supported, Feet supported Sitting balance-Leahy Scale: Fair     Standing balance support: No upper extremity supported Standing balance-Leahy Scale: Fair                             ADL either performed or assessed with clinical judgement   ADL Overall ADL's : Needs assistance/impaired     Grooming: Wash/dry hands;Wash/dry face;Oral care;Standing;Set up               Lower Body Dressing: Supervision/safety (verbal cues to using figure 4 positioning to adjust socks while seated EOB)               Functional mobility during ADLs: Contact guard assist;Rolling walker (2 wheels) (verbal cues to not place weight/push through UEs when using RW prior to use)      Extremity/Trunk Assessment Upper Extremity Assessment Upper Extremity Assessment: Generalized weakness            Vision       Perception     Praxis     Communication Communication Communication: No apparent difficulties   Cognition Arousal: Alert Behavior During Therapy: Flat affect Cognition: Cognition impaired  Memory impairment (select all impairments): Short-term memory                                Cueing      Exercises      Shoulder Instructions       General Comments      Pertinent Vitals/ Pain       Pain Assessment Pain Assessment: 0-10 Pain Score: 3  Pain Location: sternum, Rt leg Pain Descriptors / Indicators: Aching Pain Intervention(s): Monitored during session  Home Living                                           Prior Functioning/Environment              Frequency  Min 2X/week        Progress Toward Goals  OT Goals(current goals can now be found in the care plan section)  Progress towards OT goals: Progressing toward goals  Acute Rehab OT Goals OT Goal Formulation: With family Time For Goal Achievement: 11/18/23 Potential to Achieve Goals: Good ADL Goals Pt Will Perform Grooming: with supervision;standing Pt Will Perform Upper Body Dressing: with set-up;sitting Pt Will Perform Lower Body Dressing: with min assist;sit to/from stand;sitting/lateral leans Pt Will Transfer to Toilet: with contact guard assist;ambulating Pt Will Perform Toileting - Clothing Manipulation and hygiene: with set-up;sitting/lateral leans;sit to/from stand Additional ADL Goal #1: Pt will identify and implement precautions during all ADL with no more than 1 verbal cue  Plan      Co-evaluation                 AM-PAC OT 6 Clicks Daily Activity     Outcome Measure   Help from another person eating meals?: A Little Help from another person taking care of personal grooming?: A Little Help from another person toileting, which includes using toliet, bedpan, or urinal?: A Lot Help from another person bathing (including washing, rinsing, drying)?: A Lot Help from another person to put on and taking off regular upper body clothing?: A Little Help from another person to put on and taking off regular lower body clothing?: A Lot 6 Click Score: 15    End of Session Equipment Utilized During Treatment: Rolling walker (2 wheels);Oxygen  OT Visit Diagnosis: Unsteadiness on feet (R26.81);Muscle weakness (generalized) (M62.81);Other (comment)   Activity Tolerance Patient tolerated treatment well   Patient Left in chair;with call bell/phone within reach   Nurse Communication Mobility status        Time: 9194-9163 OT Time Calculation (min): 31 min  Charges: OT General Charges $OT Visit: 1  Visit OT Treatments $Self Care/Home Management : 23-37 mins  Lamarr Pouch OT/L  [name] 11/07/2023, 10:32 AM

## 2023-11-07 NOTE — TOC Progression Note (Signed)
 Transition of Care (TOC) - Progression Note  Rayfield Gobble RN, BSN Transitions of Care Unit 4E- RN Case Manager See Treatment Team for direct phone #   Patient Details  Name: Brian Caldwell MRN: [id number] Date of Birth: [birth year]  Transition of Care [facility]) CM/SW Contact  Gobble Rayfield Hurst, RN Phone Number: 11/07/2023, 4:43 PM  Clinical Narrative:    Note pt's preference to return home vs CIR.  HH orders placed for HHPT/OT  CM reached out to [facility] liaison- as TCTS office had made referral to them for [facility] needs- per liaison who spoke w/ pt- he does not have VA benefits and his [facility] Medicaid is OON w/ [facility]- they will not be able to service  Additional calls made to the following [facility] providers: [facility] (Amy) - OON- unable to service [facility])- OON- unable to service [facility])- OON- unable to service  CM will contact additional agencies in the AM- however even with [facility]- pt will only get 3 visits under his Medicaid benefits.  Will follow up with pt regarding HH barriers.    Expected Discharge Plan: Home/Self Care Barriers to Discharge: Continued Medical Work up  Expected Discharge Plan and Services   Discharge Planning Services: CM Consult   Living arrangements for the past 2 months: Single Family Home                                       Social Determinants of Health (SDOH) Interventions SDOH Screenings   Food Insecurity: No Food Insecurity (10/29/2023)  Housing: Low Risk  (10/29/2023)  Transportation Needs: No Transportation Needs (10/29/2023)  Utilities: Not At Risk (10/29/2023)  Tobacco Use: High Risk (11/02/2023)    Readmission Risk Interventions     No data to display

## 2023-11-07 NOTE — Progress Notes (Signed)
 Pt sts he just ambulated with O2 and RW. Feeling well and feels he can walk without RW.   Discussed with pt IS, flutter, sternal precautions, exercise, smoking cessation, diet, daily wts, and CRPII. Pt receptive. He sts he quit smoking the day he came in and is doing well on the nicotine  patch. He is motivated, resources given. Will refer to [facility] CRPII. Gave HF booklet and d/c video to view. Encouraged walking x3 every day.  [id number] Aliene Aris BS, ACSM-CEP 11/07/2023 9:51 AM

## 2023-11-07 NOTE — Progress Notes (Signed)
 Physical Therapy Treatment Patient Details Name: Brian Caldwell MRN: [id number] DOB: 12-29-1975 Today's Date: 11/07/2023   History of Present Illness 48 yo M admitted 6/28; who presented to [facility] 6/28 with chest pain,  w NSTEMI and transferred to [facility]. Cathed 6/30, unable to stent. S/p CABGx3 11/02/23 and lysis of adhesions. PMH CAD, ASA allergy, HTN, HLD, ICM.    PT Comments  Pt received in recliner after eating lunch, pt agreeable to therapy session and with good participation in transfer and gait training with RW support. Pt only lightly relying on RW support this date and with improving stability and activity tolerance. Plan to review stairs with pt next session and may be able to consider HHPT in next 1-2 sessions pending progress if pt has initial increased supervision/assist at home. Pt continues to benefit from PT services to progress toward functional mobility goals.     If plan is discharge home, recommend the following: A lot of help with bathing/dressing/bathroom;Assistance with cooking/housework;Assist for transportation;Help with stairs or ramp for entrance;A little help with walking and/or transfers   Can travel by private vehicle        Equipment Recommendations  Rolling walker (2 wheels)    Recommendations for Other Services Rehab consult     Precautions / Restrictions Precautions Precautions: Sternal;Fall Precaution Booklet Issued: Yes (comment) Recall of Precautions/Restrictions: Impaired Precaution/Restrictions Comments:  (cues not to extend shoulder behind him while pushing up) Restrictions Weight Bearing Restrictions Per Provider Order: No Other Position/Activity Restrictions: sternal precautions     Mobility  Bed Mobility Overal bed mobility: Needs Assistance (HOB elevated)             General bed mobility comments: pt received up in chair    Transfers Overall transfer level: Needs assistance Equipment used: Rolling walker (2 wheels), Bilateral platform  walker Transfers: Sit to/from Stand Sit to Stand: Contact guard assist           General transfer comment: from recliner>RW, initial cues for safe UE placement    Ambulation/Gait Ambulation/Gait assistance: Supervision Gait Distance (Feet): 350 Feet Assistive device: Rolling walker (2 wheels) Gait Pattern/deviations: Step-to pattern, Step-through pattern, Narrow base of support, Decreased stance time - right       General Gait Details: Cues for larger step length for energy conservation, pt reports increased RLE pain limiting his step length. SpO2 WFL on 2L O2 [place]. SpO2 88% on RA with minimal distnace in room so kept him on 2L O2 [place] for hallway mobility and remains on 2L/min once in bathroom at end of session. SpO2 95% and above on 2L [place]; HR to ~115 bpm wtih exertion.   Stairs Stairs:  (pt defers, reports his steps are small, agreeable to curb height steps next session.)           Wheelchair Mobility     Tilt Bed    Modified Rankin (Stroke Patients Only)       Balance Overall balance assessment: Needs assistance Sitting-balance support: No upper extremity supported, Feet supported Sitting balance-Leahy Scale: Good     Standing balance support: No upper extremity supported Standing balance-Leahy Scale: Fair Standing balance comment: using RW for safety but tending to lift it up when turning, reports he is only very lightly using it if at all.                            Communication Communication Communication: No apparent difficulties  Cognition Arousal: Alert Behavior  During Therapy: Flat affect                           PT - Cognition Comments: Min cues for precs Following commands: Intact      Cueing Cueing Techniques: Verbal cues  Exercises      General Comments General comments (skin integrity, edema, etc.): VSS on 2L o2 [place]      Pertinent Vitals/Pain Pain Assessment Pain Assessment: Faces Faces Pain Scale: Hurts little  more Pain Location: sternum w/coughing, Rt leg Pain Descriptors / Indicators: Aching, Guarding Pain Intervention(s): Limited activity within patient's tolerance, Monitored during session, Repositioned    Home Living                          Prior Function            PT Goals (current goals can now be found in the care plan section) Acute Rehab PT Goals Patient Stated Goal: Get well PT Goal Formulation: With patient Time For Goal Achievement: 11/18/23 Progress towards PT goals: Progressing toward goals    Frequency    Min 3X/week      PT Plan      Co-evaluation              AM-PAC PT 6 Clicks Mobility   Outcome Measure  Help needed turning from your back to your side while in a flat bed without using bedrails?: A Little Help needed moving from lying on your back to sitting on the side of a flat bed without using bedrails?: A Lot Help needed moving to and from a bed to a chair (including a wheelchair)?: A Little Help needed standing up from a chair using your arms (e.g., wheelchair or bedside chair)?: A Little Help needed to walk in hospital room?: A Little Help needed climbing 3-5 steps with a railing? : A Lot 6 Click Score: 16    End of Session Equipment Utilized During Treatment: Gait belt;Oxygen Activity Tolerance: Patient tolerated treatment well Patient left: with call bell/phone within reach;Other (comment) (on toilet, pt aware to pull wall call bell when done in bathroom) Nurse Communication: Mobility status;Other (comment) (pt on toilet in bathroom, RN and NT notified via secure chat) PT Visit Diagnosis: Unsteadiness on feet (R26.81);Other abnormalities of gait and mobility (R26.89);Muscle weakness (generalized) (M62.81);Difficulty in walking, not elsewhere classified (R26.2);Pain     Time: 8754-8695 PT Time Calculation (min) (ACUTE ONLY): 19 min  Charges:    $Gait Training: 8-22 mins PT General Charges $$ ACUTE PT VISIT: 1 Visit                      Onnie Hatchel P., PTA Acute Rehabilitation Services Secure Chat Preferred 9a-5:30pm Office: [phone]    Connell HERO [facility] 11/07/2023, 1:21 PM

## 2023-11-07 NOTE — Progress Notes (Addendum)
 Patient ID: Brian Caldwell, male   DOB: 07/06/1975, 48 y.o.   MRN: [id number]     Advanced Heart Failure Rounding Note  Cardiologist: Alvan Carrier, MD  Chief Complaint: NSTEMI Subjective:   - Pre-op echo with EF 30-35%, normal RV.  - S/p CABGx3 11/02/23: (LIMA-LAD, Rt Radial Artery- PDA, SVG-PLOM)  Diuresed 3.7L yesterday with 80 IV lasix  x1. Back down to pre-op weight.  Afebrile. On Levofloxacin  for possible PNA  Resting in bed. Denies CP/SOB. Encouraged to ambulate today. Has been using IS at bedside. Plan to get flutter valve today.   Objective:   Weight Range: 122.2 kg Body mass index is 36.54 kg/m.   Vital Signs:   Temp:  [97.9 F (36.6 C)-99.3 F (37.4 C)] 98.6 F (37 C) (07/08 0733) Pulse Rate:  [96-117] 100 (07/08 0733) Resp:  [13-20] 16 (07/08 0733) BP: (99-149)/(59-87) 107/71 (07/08 0733) SpO2:  [90 %-98 %] 94 % (07/08 0733) Weight:  [122.2 kg] 122.2 kg (07/08 0500) Last BM Date : 11/01/23  Weight change: Filed Weights   11/04/23 0645 11/06/23 0423 11/07/23 0500  Weight: 125.1 kg 124.2 kg 122.2 kg   Intake/Output:   Intake/Output Summary (Last 24 hours) at 11/07/2023 0757 Last data filed at 11/07/2023 0737 Gross per 24 hour  Intake 720 ml  Output 4100 ml  Net -3380 ml    Physical Exam  General:  well appearing.  No respiratory difficulty. + productive cough HEENT: +2L [place] Neck: supple. JVD ~7 cm.  Cor: PMI nondisplaced. Regular rate & rhythm. No rubs, gallops or murmurs. Lungs: clear, diminished bases Extremities: no cyanosis, clubbing, rash, trace BLE edema  Neuro: alert & oriented x 3. Moves all 4 extremities w/o difficulty. Affect pleasant.   Telemetry   NSR-ST 90s-low 100s Personally reviewed  Labs    CBC Recent Labs    11/06/23 0500 11/07/23 0425  WBC 14.1* 14.3*  HGB 10.7* 10.7*  HCT 32.5* 32.3*  MCV 97.6 95.8  PLT 214 272   Basic Metabolic Panel Recent Labs    92/92/74 0500 11/07/23 0425  NA 133* 135  K 3.8 3.6  CL 91*  90*  CO2 33* 33*  GLUCOSE 120* 109*  BUN 21* 26*  CREATININE 0.92 1.30*  CALCIUM  8.8* 8.9   Liver Function Tests No results for input(s): AST, ALT, ALKPHOS, BILITOT, PROT, ALBUMIN  in the last 72 hours. No results for input(s): LIPASE, AMYLASE in the last 72 hours. Cardiac Enzymes No results for input(s): CKTOTAL, CKMB, CKMBINDEX, TROPONINI in the last 72 hours.  BNP: BNP (last 3 results) No results for input(s): BNP in the last 8760 hours.  ProBNP (last 3 results) No results for input(s): PROBNP in the last 8760 hours.   D-Dimer No results for input(s): DDIMER in the last 72 hours. Hemoglobin A1C No results for input(s): HGBA1C in the last 72 hours.  Fasting Lipid Panel No results for input(s): CHOL, HDL, LDLCALC, TRIG, CHOLHDL, LDLDIRECT in the last 72 hours.  Thyroid  Function Tests No results for input(s): TSH, T4TOTAL, T3FREE, THYROIDAB in the last 72 hours.  Invalid input(s): FREET3  Other results:   Imaging  No results found.  Medications:   Scheduled Medications:  acetaminophen   1,000 mg Oral Q6H   Or   acetaminophen  (TYLENOL ) oral liquid 160 mg/5 mL  1,000 mg Per Tube Q6H   amLODipine   2.5 mg Oral Daily   atorvastatin   80 mg Oral Daily   bisacodyl   10 mg Oral Daily   Or  bisacodyl   10 mg Rectal Daily   Chlorhexidine  Gluconate Cloth  6 each Topical Daily   clopidogrel   75 mg Oral Daily   docusate sodium   200 mg Oral Daily   enoxaparin  (LOVENOX ) injection  40 mg Subcutaneous QHS   furosemide   40 mg Oral Daily   guaiFENesin   600 mg Oral BID   levofloxacin   750 mg Oral Daily   metoprolol  succinate  12.5 mg Oral Daily   mupirocin  ointment  1 Application Nasal BID   nicotine   21 mg Transdermal Daily   pantoprazole   40 mg Oral Daily   polyethylene glycol  17 g Oral Daily   potassium chloride   20 mEq Oral BID   sodium chloride  flush  3 mL Intravenous Q12H   spironolactone   25 mg Oral Daily     Infusions:  sodium chloride        PRN Medications: sodium chloride , ALPRAZolam , ketorolac , lactulose , levalbuterol , metoprolol  tartrate, ondansetron  (ZOFRAN ) IV, mouth rinse, oxyCODONE , sodium chloride  flush, traMADol  Patient Profile   Brian Caldwell is a 48 y.o. male with CAD with previous PCI in 35' and 16', HTN, HLD, hx of traumatic aortic dissection s/p repair and tobacco abuse. Admitted with late presenting NSTEMI.   Assessment/Plan  1. NSTEMI / CAD - Hx STEMI 71' s/p bare-metal stent to LAD - Hx NSTEMI 68' s/p LCx and OM stent, PTCA of jailed LCx, RCA CTO with left-to-right collaterals  - LHC 21' PTCA to LCx (ISR) and stent to LAD  - LHC this admission with subtotally occluded proximal and distal LAD with thrombus and plaque formation in and around his old LAD stents. There was ~50% ISR of the LCx/OM stent as well and CTO of the RCA. Now s/p angioplasty of the proximal and distal LAD with restoration of flow. Unable to stent anything - S/p CABGx3 11/02/23: (LIMA-LAD, Rt Radial Artery- PDA, SVG-PLOM) - on low dose amlodipine  for radial graft - Continue statin.  - Continue clopidogrel  (ASA allergy).  - No current s/s angina - Continue to mobilize   2. HFrEF, iCM - Echo 6/30: EF 30-35%, LV with RWMA, RV normal, trivial MR - NYHA IV on admission - Stable off pressors/inotropes - Appears euvolemic on exam. Will follow for diuretic need. Will start SGLT2i today - Continue Toprol  XL 12.5 mg daily.  - Continue spiro 25 - Start Jardiance  10 mg daily - Anticipate low dose ARB closer to discharge. BP currently low 100s  3. HLD - LDL 51 10/29/23 - Continue statin, atorva 80 mg     4. Tobacco abuse - Still smoking 2-3 PPD - imperative to stop smoking - Nicotine  patch  5. ID - Levofloxacin  started on 7/6 for possible COPD flare - WBC 16K -> 14.1>14.3K  - CXR with LLL atx and effusion.  Evaluated with CCM personally under u/s. -> has fluid there but anatomy complicated by  prvious thoracotomy. Will focus on IS, abx and diuresis. If needs tap can re-valuate under CT guidance - D/w CCM, continue levofloxacin  x 5 days, end-date 7/11   CIR consulted. At this time patient would like to go home at discharge. CIR will follow.   Brian LITTIE Coe, NP  7:57 AM  Patient seen with NP, I formulated the plan and agree with the above note.   Good diuresis yesterday, weight back to pre-op.  Creatinine higher at 1.3.    He is walking in hall, denies dyspnea.   General: NAD Neck: No JVD, no thyromegaly or thyroid  nodule.  Lungs:  Clear to auscultation bilaterally with normal respiratory effort. CV: Nondisplaced PMI.  Heart regular S1/S2, no S3/S4, no murmur. Trace ankle edema.   Abdomen: Soft, nontender, no hepatosplenomegaly, no distention.  Skin: Intact without lesions or rashes.  Neurologic: Alert and oriented x 3.  Psych: Normal affect. Extremities: No clubbing or cyanosis.  HEENT: Normal.   Volume status optimized.  Can hold off on diuretic today.   With ischemic cardiomyopathy, he is on Toprol  XL 12.5 daily and spironolactone  25 daily.  Will add Jardiance  10 mg daily today.  SBP 90s-100s, will add losartan  tomorrow if creatinine remains stable.   Continue to mobilize, hopefully home soon.   Brian Caldwell 11/07/2023 9:37 AM

## 2023-11-08 DIAGNOSIS — I214 Non-ST elevation (NSTEMI) myocardial infarction: Secondary | ICD-10-CM | POA: Diagnosis not present

## 2023-11-08 LAB — CULTURE, RESPIRATORY W GRAM STAIN: Culture: NORMAL

## 2023-11-08 LAB — BASIC METABOLIC PANEL WITH GFR
Anion gap: 8 (ref 5–15)
BUN: 28 mg/dL — ABNORMAL HIGH (ref 6–20)
CO2: 31 mmol/L (ref 22–32)
Calcium: 8.5 mg/dL — ABNORMAL LOW (ref 8.9–10.3)
Chloride: 93 mmol/L — ABNORMAL LOW (ref 98–111)
Creatinine, Ser: 1.28 mg/dL — ABNORMAL HIGH (ref 0.61–1.24)
GFR, Estimated: >60 mL/min (ref >60)
Glucose, Bld: 100 mg/dL — ABNORMAL HIGH (ref 70–99)
Potassium: 4 mmol/L (ref 3.5–5.1)
Sodium: 132 mmol/L — ABNORMAL LOW (ref 135–145)

## 2023-11-08 LAB — GLUCOSE, CAPILLARY
Glucose-Capillary: 108 mg/dL — ABNORMAL HIGH (ref 70–99)
Glucose-Capillary: 99 mg/dL (ref 70–99)

## 2023-11-08 MED ORDER — LACTULOSE 10 GM/15ML PO SOLN
20.0000 g | Freq: Every day | ORAL | Status: DC
  Administered 2023-11-08: 20 g via ORAL
  Filled 2023-11-08 (×2): qty 30

## 2023-11-08 MED ORDER — DIGOXIN 125 MCG PO TABS
0.1250 mg | ORAL_TABLET | Freq: Every day | ORAL | Status: DC
  Administered 2023-11-08 – 2023-11-10 (×3): 0.125 mg via ORAL
  Filled 2023-11-08 (×3): qty 1

## 2023-11-08 MED ORDER — LOSARTAN POTASSIUM 25 MG PO TABS
12.5000 mg | ORAL_TABLET | Freq: Every day | ORAL | Status: DC
  Administered 2023-11-08 – 2023-11-09 (×2): 12.5 mg via ORAL
  Filled 2023-11-08 (×2): qty 1

## 2023-11-08 MED ORDER — METOPROLOL SUCCINATE ER 25 MG PO TB24
25.0000 mg | ORAL_TABLET | Freq: Every day | ORAL | Status: DC
  Administered 2023-11-08 – 2023-11-09 (×2): 25 mg via ORAL
  Filled 2023-11-08 (×2): qty 1

## 2023-11-08 MED FILL — Heparin Sodium (Porcine) Inj 1000 Unit/ML: INTRAMUSCULAR | Qty: 10 | Status: AC

## 2023-11-08 MED FILL — Sodium Bicarbonate IV Soln 8.4%: INTRAVENOUS | Qty: 50 | Status: AC

## 2023-11-08 MED FILL — Mannitol IV Soln 20%: INTRAVENOUS | Qty: 500 | Status: AC

## 2023-11-08 MED FILL — Heparin Sodium (Porcine) Inj 1000 Unit/ML: INTRAMUSCULAR | Qty: 30 | Status: AC

## 2023-11-08 MED FILL — Calcium Chloride Inj 10%: INTRAVENOUS | Qty: 10 | Status: AC

## 2023-11-08 MED FILL — Electrolyte-R (PH 7.4) Solution: INTRAVENOUS | Qty: 4000 | Status: AC

## 2023-11-08 MED FILL — Sodium Chloride IV Soln 0.9%: INTRAVENOUS | Qty: 3000 | Status: AC

## 2023-11-08 NOTE — TOC Progression Note (Signed)
 Transition of Care (TOC) - Progression Note  [name], BSN Transitions of Care Unit 4E- RN Case Manager See Treatment Team for direct phone #   Patient Details  Name: Brian Caldwell MRN: [id number] Date of Birth: [birth year]  Transition of Care [facility]) CM/SW Contact  Brian Rayfield Hurst, RN Phone Number: 11/08/2023, 1:54 PM  Clinical Narrative:    Additional calls made to [facility] agencies:  [facility]- [facility]- unable to accept [facility]- [facility]- unable to accept [facility]- will check closer to discharge-   CM spoke with pt at bedside- discussed that no HH agencies are able to service for [facility] needs- have one agency left to check and will check closer to discharge- otherwise pt voiced understanding that he will likely not have HH on discharge.   Discussed DME needs- order in for RW- pt voiced he does not have preference on DME provider- also asking about a bariatric BSC- will have provider also place order for [facility] (Bariatric- due to body habitus)  Will order DME closer to discharge for delivery.   Pt confirmed address on discharge: [address] [place],  [place] [address] Phone: [phone]   Expected Discharge Plan: Home/Self Care Barriers to Discharge: Continued Medical Work up  Expected Discharge Plan and Services   Discharge Planning Services: CM Consult   Living arrangements for the past 2 months: Single Family Home                                       Social Determinants of Health (SDOH) Interventions SDOH Screenings   Food Insecurity: No Food Insecurity (10/29/2023)  Housing: Low Risk  (10/29/2023)  Transportation Needs: No Transportation Needs (10/29/2023)  Utilities: Not At Risk (10/29/2023)  Tobacco Use: High Risk (11/02/2023)    Readmission Risk Interventions     No data to display

## 2023-11-08 NOTE — Progress Notes (Signed)
 Occupational Therapy Treatment Patient Details Name: Brian Caldwell MRN: [id number] DOB: March 05, 1976 Today's Date: 11/08/2023   History of present illness 48 yo M admitted 6/28; who presented to [facility] 6/28 with chest pain,  w NSTEMI and transferred to [facility]. Cathed 6/30, unable to stent. S/p CABGx3 11/02/23 and lysis of adhesions. PMH CAD, ASA allergy, HTN, HLD, ICM.   OT comments  Pt greeted supine in bed, pt declining OOB mobility but agreeable to educational session regarding DME needs and compensatory strategies to maintain sternal precautions. Reviewed sternal precautions in relation to ADL participation and issued pt handout to reinforce education. Pt reports tub shower at home, updated DME recs to include TTB. Additionally discussed DC recs as pt advancing beyond the need for intensive AIR program, updated DC plan to [facility]. Will follow acutely for OT needs.       If plan is discharge home, recommend the following:  A little help with walking and/or transfers;A little help with bathing/dressing/bathroom;Assistance with cooking/housework;Help with stairs or ramp for entrance;Assist for transportation   Equipment Recommendations  Tub/shower bench    Recommendations for Other Services      Precautions / Restrictions Precautions Precautions: Sternal;Fall Precaution Booklet Issued: Yes (comment) Recall of Precautions/Restrictions: Impaired Precaution/Restrictions Comments: issued pt handout and reviewed precautions in relation to ADLs Restrictions Weight Bearing Restrictions Per Provider Order: No Other Position/Activity Restrictions: sternal precautions       Mobility Bed Mobility               General bed mobility comments: verbally reviewed log roll technique to maintain sternal precautions    Transfers                   General transfer comment: discussed hand placement for sit>stands to maintain sternal precautions     Balance                                            ADL either performed or assessed with clinical judgement   ADL                                         General ADL Comments: reviewed sternal precaution implications in relation to ADLs i.e bathing/dressing. educated pt on compensatory strategies as well as DME needs. recommended TTB and showed pt where to purchase on [facility]    Extremity/Trunk Assessment Upper Extremity Assessment Upper Extremity Assessment: Generalized weakness   Lower Extremity Assessment Lower Extremity Assessment: Defer to PT evaluation        Vision Baseline Vision/History: 1 Wears glasses Patient Visual Report: No change from baseline Vision Assessment?: No apparent visual deficits   Perception     Praxis     Communication Communication Communication: No apparent difficulties   Cognition Arousal: Alert Behavior During Therapy: WFL for tasks assessed/performed Cognition: Cognition impaired       Memory impairment (select all impairments): Short-term memory     OT - Cognition Comments: decreased recall of precautions, extensive education provided with multimodal cues                 Following commands: Intact        Cueing   Cueing Techniques: Verbal cues  Exercises      Shoulder Instructions  General Comments pt requested to be in chair positon in bed    Pertinent Vitals/ Pain       Pain Assessment Pain Assessment: 0-10 Pain Score: 6  Pain Location: chest Pain Descriptors / Indicators: Discomfort Pain Intervention(s): Monitored during session, Ice applied  Home Living                                          Prior Functioning/Environment              Frequency  Min 2X/week        Progress Toward Goals  OT Goals(current goals can now be found in the care plan section)  Progress towards OT goals: Progressing toward goals  Acute Rehab OT Goals Patient Stated Goal: to go home OT Goal  Formulation: With patient Time For Goal Achievement: 11/18/23 Potential to Achieve Goals: Good  Plan      Co-evaluation                 AM-PAC OT 6 Clicks Daily Activity     Outcome Measure   Help from another person eating meals?: None Help from another person taking care of personal grooming?: A Little Help from another person toileting, which includes using toliet, bedpan, or urinal?: A Little Help from another person bathing (including washing, rinsing, drying)?: A Little Help from another person to put on and taking off regular upper body clothing?: A Little Help from another person to put on and taking off regular lower body clothing?: A Little 6 Click Score: 19    End of Session    OT Visit Diagnosis: Unsteadiness on feet (R26.81);Muscle weakness (generalized) (M62.81);Other (comment)   Activity Tolerance Patient tolerated treatment well   Patient Left in bed;with call bell/phone within reach;with bed alarm set   Nurse Communication Mobility status        Time: 1441-1459 OT Time Calculation (min): 18 min  Charges: OT General Charges $OT Visit: 1 Visit OT Treatments $Self Care/Home Management : 8-22 mins  [name], COTA/L Acute Rehabilitation Services [phone]   [name] 11/08/2023, 3:20 PM

## 2023-11-08 NOTE — Progress Notes (Signed)
   11/07/23 1950  Assess: MEWS Score  Temp 98.9 F (37.2 C)  BP 111/71  MAP (mmHg) 81  Pulse Rate (!) 113  ECG Heart Rate (!) 116  Resp 20  SpO2 97 %  O2 Device Nasal Cannula  O2 Flow Rate (L/min) 2 L/min  Assess: MEWS Score  MEWS Temp 0  MEWS Systolic 0  MEWS Pulse 2  MEWS RR 0  MEWS LOC 0  MEWS Score 2  MEWS Score Color Yellow  Assess: if the MEWS score is Yellow or Red  Were vital signs accurate and taken at a resting state? Yes  Does the patient meet 2 or more of the SIRS criteria? No  MEWS guidelines implemented  Yes, yellow  Treat  MEWS Interventions Considered administering scheduled or prn medications/treatments as ordered  Take Vital Signs  Increase Vital Sign Frequency  Yellow: Q2hr x1, continue Q4hrs until patient remains green for 12hrs  Escalate  MEWS: Escalate Yellow: Discuss with charge nurse and consider notifying provider and/or RRT  Notify: Charge Nurse/RN  Name of Charge Nurse/RN Notified Reena  Assess: SIRS CRITERIA  SIRS Temperature  0  SIRS Respirations  0  SIRS Pulse 1  SIRS WBC 0  SIRS Score Sum  1

## 2023-11-08 NOTE — Progress Notes (Addendum)
 Patient ID: Brian Caldwell, male   DOB: May 25, 1975, 48 y.o.   MRN: [id number]     Advanced Heart Failure Rounding Note  Cardiologist: Alvan Carrier, MD  Chief Complaint: NSTEMI Subjective:   - Pre-op echo with EF 30-35%, normal RV.  - S/p CABGx3 11/02/23: (LIMA-LAD, Rt Radial Artery- PDA, SVG-PLOM)  Wt continues to trend down, now below pre-op wt. Had 4.5L in UOP yesterday. Scr stable at 1.3 today. K 4.0. SBPs low 100s-110s. Tachycardic, ST 110s resting.   Denies any current resting dyspnea. Denies dizziness. Appetite good.    Objective:   Weight Range: 120.5 kg Body mass index is 36.03 kg/m.   Vital Signs:   Temp:  [97.8 F (36.6 C)-99.5 F (37.5 C)] 98.2 F (36.8 C) (07/09 0838) Pulse Rate:  [95-122] 110 (07/09 0838) Resp:  [16-20] 20 (07/09 0838) BP: (104-125)/(60-81) 105/75 (07/09 0838) SpO2:  [95 %-100 %] 100 % (07/09 0838) Weight:  [120.5 kg] 120.5 kg (07/09 0602) Last BM Date : 11/01/23  Weight change: Filed Weights   11/06/23 0423 11/07/23 0500 11/08/23 0602  Weight: 124.2 kg 122.2 kg 120.5 kg   Intake/Output:   Intake/Output Summary (Last 24 hours) at 11/08/2023 0935 Last data filed at 11/08/2023 0600 Gross per 24 hour  Intake 840 ml  Output 3800 ml  Net -2960 ml    Physical Exam   General:  Well appearing, obese. No respiratory difficulty HEENT: normal Neck: supple. no JVD. Carotids 2+ bilat; no bruits. No lymphadenopathy or thyromegaly appreciated. Cor: PMI nondisplaced. Regular rhythm and tachy rate.  Lungs: clear Abdomen: soft, nontender, nondistended. No hepatosplenomegaly. No bruits or masses. Good bowel sounds. Extremities: no cyanosis, clubbing, rash, edema Neuro: alert & oriented x 3, cranial nerves grossly intact. moves all 4 extremities w/o difficulty. Affect pleasant.   Telemetry   Sinus tach 110s Personally reviewed  Labs    CBC Recent Labs    11/06/23 0500 11/07/23 0425  WBC 14.1* 14.3*  HGB 10.7* 10.7*  HCT 32.5* 32.3*   MCV 97.6 95.8  PLT 214 272   Basic Metabolic Panel Recent Labs    92/91/74 0425 11/08/23 0323  NA 135 132*  K 3.6 4.0  CL 90* 93*  CO2 33* 31  GLUCOSE 109* 100*  BUN 26* 28*  CREATININE 1.30* 1.28*  CALCIUM  8.9 8.5*   Liver Function Tests No results for input(s): AST, ALT, ALKPHOS, BILITOT, PROT, ALBUMIN  in the last 72 hours. No results for input(s): LIPASE, AMYLASE in the last 72 hours. Cardiac Enzymes No results for input(s): CKTOTAL, CKMB, CKMBINDEX, TROPONINI in the last 72 hours.  BNP: BNP (last 3 results) No results for input(s): BNP in the last 8760 hours.  ProBNP (last 3 results) No results for input(s): PROBNP in the last 8760 hours.   D-Dimer No results for input(s): DDIMER in the last 72 hours. Hemoglobin A1C No results for input(s): HGBA1C in the last 72 hours.  Fasting Lipid Panel No results for input(s): CHOL, HDL, LDLCALC, TRIG, CHOLHDL, LDLDIRECT in the last 72 hours.  Thyroid  Function Tests No results for input(s): TSH, T4TOTAL, T3FREE, THYROIDAB in the last 72 hours.  Invalid input(s): FREET3  Other results:   Imaging  No results found.  Medications:   Scheduled Medications:  amLODipine   2.5 mg Oral Daily   atorvastatin   80 mg Oral Daily   bisacodyl   10 mg Oral Daily   Or   bisacodyl   10 mg Rectal Daily   clopidogrel   75 mg Oral Daily  docusate sodium   200 mg Oral Daily   empagliflozin   10 mg Oral Daily   enoxaparin  (LOVENOX ) injection  40 mg Subcutaneous QHS   guaiFENesin   600 mg Oral BID   lactulose   20 g Oral Daily   levofloxacin   750 mg Oral Daily   metoprolol  succinate  25 mg Oral Daily   nicotine   21 mg Transdermal Daily   pantoprazole   40 mg Oral Daily   polyethylene glycol  17 g Oral Daily   spironolactone   25 mg Oral Daily    Infusions:     PRN Medications: ALPRAZolam , ketorolac , levalbuterol , metoprolol  tartrate, ondansetron  (ZOFRAN ) IV, mouth rinse,  oxyCODONE , traMADol  Patient Profile   Brian Caldwell is a 48 y.o. male with CAD with previous PCI in 39' and 16', HTN, HLD, hx of traumatic aortic dissection s/p repair and tobacco abuse. Admitted with late presenting NSTEMI.   Assessment/Plan  1. NSTEMI / CAD - Hx STEMI 10' s/p bare-metal stent to LAD - Hx NSTEMI 67' s/p LCx and OM stent, PTCA of jailed LCx, RCA CTO with left-to-right collaterals  - LHC 21' PTCA to LCx (ISR) and stent to LAD  - LHC this admission with subtotally occluded proximal and distal LAD with thrombus and plaque formation in and around his old LAD stents. There was ~50% ISR of the LCx/OM stent as well and CTO of the RCA. Now s/p angioplasty of the proximal and distal LAD with restoration of flow. Unable to stent anything - S/p CABGx3 11/02/23: (LIMA-LAD, Rt Radial Artery- PDA, SVG-PLOM) - on low dose amlodipine  for radial graft - Continue statin.  - Continue clopidogrel  (ASA allergy).  - No current s/s angina - Continue to mobilize   2. HFrEF, iCM - Echo 6/30: EF 30-35%, LV with RWMA, RV normal, trivial MR - NYHA IV on admission - Stable off pressors/inotropes - Appears euvolemic on exam. Does not need loop diuretic - Continue Jardiance  10 mg daily  - Continue Toprol  XL 12.5 mg daily.  - Continue spiro 25 mg daily  - Start Losartan  12.5 mg at bedtime - Start digoxin  0.125 mg daily for rate control   3. HLD - LDL 51 10/29/23 - Continue statin, atorva 80 mg     4. Tobacco abuse - Still smoking 2-3 PPD - imperative to stop smoking - Nicotine  patch  5. ID - Levofloxacin  started on 7/6 for possible COPD flare - WBC 16K -> 14.1>14.3K yesterday. Obtain f/u CBC on am labs   - CXR with LLL atx and effusion.  Evaluated with CCM personally under u/s. -> has fluid there but anatomy complicated by prvious thoracotomy. Will focus on IS, abx and diuresis. If needs tap can re-valuate under CT guidance - D/w CCM, continue levofloxacin  x 5 days, end-date 7/11     Caffie Shed, PA-C  9:35 AM  Patient seen with PA, I formulated the plan and agree with the above note.   He denies dyspnea.  HR in 110s after returning from walk.  Creatinine stable. Weight down 4 lbs.   General: NAD Neck: No JVD, no thyromegaly or thyroid  nodule.  Lungs: Distant BS with occasional wheezing.  CV: Nondisplaced PMI.  Heart regular S1/S2, no S3/S4, no murmur.  Trace ankle edema.  Abdomen: Soft, nontender, no hepatosplenomegaly, no distention.  Skin: Intact without lesions or rashes.  Neurologic: Alert and oriented x 3.  Psych: Normal affect. Extremities: No clubbing or cyanosis.  HEENT: Normal.   Occasional wheezing on exam, he is on levofloxacin  for  possible COPD flare.  Denies dyspnea.   Volume status looks good, does not need loop diuretic. Agree with addition of losartan  12.5 daily, can transition to Entresto if BP remains stable.  Can add digoxin  with persistent sinus tachycardia.    Continue to mobilize, hopefully home soon.   Ezra Shuck 11/08/2023 11:04 AM

## 2023-11-08 NOTE — Plan of Care (Signed)
  Problem: Education: Goal: Knowledge of General Education information will improve Description: Including pain rating scale, medication(s)/side effects and non-pharmacologic comfort measures Outcome: Progressing   Problem: Health Behavior/Discharge Planning: Goal: Ability to manage health-related needs will improve Outcome: Progressing   Problem: Clinical Measurements: Goal: Ability to maintain clinical measurements within normal limits will improve Outcome: Progressing Goal: Will remain free from infection Outcome: Progressing Goal: Diagnostic test results will improve Outcome: Progressing Goal: Respiratory complications will improve Outcome: Progressing Goal: Cardiovascular complication will be avoided Outcome: Progressing   Problem: Activity: Goal: Risk for activity intolerance will decrease Outcome: Progressing   Problem: Nutrition: Goal: Adequate nutrition will be maintained Outcome: Progressing   Problem: Coping: Goal: Level of anxiety will decrease Outcome: Progressing   Problem: Elimination: Goal: Will not experience complications related to bowel motility Outcome: Progressing Goal: Will not experience complications related to urinary retention Outcome: Progressing   Problem: Pain Managment: Goal: General experience of comfort will improve and/or be controlled Outcome: Progressing   Problem: Safety: Goal: Ability to remain free from injury will improve Outcome: Progressing   Problem: Skin Integrity: Goal: Risk for impaired skin integrity will decrease Outcome: Progressing   Problem: Education: Goal: Understanding of cardiac disease, CV risk reduction, and recovery process will improve Outcome: Progressing Goal: Individualized Educational Video(s) Outcome: Progressing   Problem: Activity: Goal: Ability to tolerate increased activity will improve Outcome: Progressing   Problem: Cardiac: Goal: Ability to achieve and maintain adequate cardiovascular  perfusion will improve Outcome: Progressing   Problem: Health Behavior/Discharge Planning: Goal: Ability to safely manage health-related needs after discharge will improve Outcome: Progressing   Problem: Education: Goal: Understanding of CV disease, CV risk reduction, and recovery process will improve Outcome: Progressing Goal: Individualized Educational Video(s) Outcome: Progressing   Problem: Activity: Goal: Ability to return to baseline activity level will improve Outcome: Progressing   Problem: Cardiovascular: Goal: Ability to achieve and maintain adequate cardiovascular perfusion will improve Outcome: Progressing Goal: Vascular access site(s) Level 0-1 will be maintained Outcome: Progressing   Problem: Health Behavior/Discharge Planning: Goal: Ability to safely manage health-related needs after discharge will improve Outcome: Progressing   Problem: Education: Goal: Will demonstrate proper wound care and an understanding of methods to prevent future damage Outcome: Progressing Goal: Knowledge of disease or condition will improve Outcome: Progressing Goal: Knowledge of the prescribed therapeutic regimen will improve Outcome: Progressing Goal: Individualized Educational Video(s) Outcome: Progressing   Problem: Activity: Goal: Risk for activity intolerance will decrease Outcome: Progressing   Problem: Cardiac: Goal: Will achieve and/or maintain hemodynamic stability Outcome: Progressing   Problem: Clinical Measurements: Goal: Postoperative complications will be avoided or minimized Outcome: Progressing   Problem: Respiratory: Goal: Respiratory status will improve Outcome: Progressing   Problem: Skin Integrity: Goal: Wound healing without signs and symptoms of infection Outcome: Progressing Goal: Risk for impaired skin integrity will decrease Outcome: Progressing   Problem: Urinary Elimination: Goal: Ability to achieve and maintain adequate renal perfusion  and functioning will improve Outcome: Progressing

## 2023-11-08 NOTE — Progress Notes (Signed)
 Physical Therapy Treatment Patient Details Name: Brian Caldwell MRN: [id number] DOB: 1975-11-25 Today's Date: 11/08/2023   History of Present Illness 48 yo M admitted 6/28; who presented to [facility] 6/28 with chest pain,  w NSTEMI and transferred to [facility]. Cathed 6/30, unable to stent. S/p CABGx3 11/02/23 and lysis of adhesions. PMH CAD, ASA allergy, HTN, HLD, ICM.    PT Comments  Pt is progressing towards goals. Currently pt is supervision for most functional activities. Pt has progressed well towards goals. Currently pt requires occasional verbal cues to maintain sternal precautions. Due to pt current functional status, home set up and available assistance at home recommending skilled physical therapy services 3x/week in order to address strength, balance and functional mobility to decrease risk for falls, injury and re-hospitalization.       If plan is discharge home, recommend the following: Assistance with cooking/housework;Assist for transportation;Help with stairs or ramp for entrance;A little help with walking and/or transfers     Equipment Recommendations  None recommended by PT       Precautions / Restrictions Precautions Precautions: Sternal;Fall Precaution Booklet Issued: Yes (comment) Recall of Precautions/Restrictions: Impaired Restrictions Weight Bearing Restrictions Per Provider Order: No RUE Weight Bearing Per Provider Order: Non weight bearing LUE Weight Bearing Per Provider Order: Non weight bearing Other Position/Activity Restrictions: sternal precautions     Mobility  Bed Mobility     General bed mobility comments: Pt received up in chair and sitting EOB at end of session    Transfers Overall transfer level: Modified independent Equipment used: None Transfers: Sit to/from Stand Sit to Stand: Supervision       General transfer comment: Pt remembers precautions when asked but keps UE on arm rest when standing reaching back    Ambulation/Gait Ambulation/Gait  assistance: Supervision Gait Distance (Feet): 350 Feet Assistive device: None Gait Pattern/deviations: Step-through pattern, Decreased stride length Gait velocity: decreased Gait velocity interpretation: 1.31 - 2.62 ft/sec, indicative of limited community ambulator   General Gait Details: Pt HR remaining WNL throughout session. Pt got fatigued and was sore due to increased cough with activity.   Stairs Stairs:  (Pt reports he has one small curb step at home and something he can hold onto to step up. No concerns)             Balance Overall balance assessment: Modified Independent Sitting-balance support: No upper extremity supported, Feet supported Sitting balance-Leahy Scale: Good     Standing balance support: No upper extremity supported Standing balance-Leahy Scale: Fair     Standardized Balance Assessment Standardized Balance Assessment : Dynamic Gait Index   Dynamic Gait Index Level Surface: Normal Change in Gait Speed: Mild Impairment Gait with Horizontal Head Turns: Mild Impairment Gait with Vertical Head Turns: Normal Gait and Pivot Turn: Normal Step Over Obstacle: Normal Step Around Obstacles: Normal Steps: Mild Impairment Total Score: 21      Communication Communication Communication: No apparent difficulties  Cognition Arousal: Alert Behavior During Therapy: WFL for tasks assessed/performed   PT - Cognitive impairments: No apparent impairments       PT - Cognition Comments: occasional cues for precautions. pt remembers to stay in the tube Following commands: Intact      Cueing Cueing Techniques: Verbal cues     General Comments General comments (skin integrity, edema, etc.): Pt on room air throughout session      Pertinent Vitals/Pain Pain Assessment Pain Assessment: 0-10 Pain Score: 8  Pain Location: 8 in the lungs, 7 at the surgical  site and 8 at the lower extremities Pain Descriptors / Indicators: Aching, Discomfort Pain  Intervention(s): Limited activity within patient's tolerance, Monitored during session     PT Goals (current goals can now be found in the care plan section) Acute Rehab PT Goals Patient Stated Goal: Get well PT Goal Formulation: With patient Time For Goal Achievement: 11/18/23 Potential to Achieve Goals: Good Progress towards PT goals: Progressing toward goals    Frequency    Min 3X/week      PT Plan   Changed DC recommendations       AM-PAC PT 6 Clicks Mobility   Outcome Measure  Help needed turning from your back to your side while in a flat bed without using bedrails?: None Help needed moving from lying on your back to sitting on the side of a flat bed without using bedrails?: None Help needed moving to and from a bed to a chair (including a wheelchair)?: A Little Help needed standing up from a chair using your arms (e.g., wheelchair or bedside chair)?: A Little Help needed to walk in hospital room?: A Little Help needed climbing 3-5 steps with a railing? : A Little 6 Click Score: 20    End of Session Equipment Utilized During Treatment: Gait belt Activity Tolerance: Patient tolerated treatment well Patient left: with call bell/phone within reach;in bed Nurse Communication: Mobility status PT Visit Diagnosis: Unsteadiness on feet (R26.81);Other abnormalities of gait and mobility (R26.89);Muscle weakness (generalized) (M62.81);Difficulty in walking, not elsewhere classified (R26.2);Pain     Time: 8657-8642 PT Time Calculation (min) (ACUTE ONLY): 15 min  Charges:    $Therapeutic Activity: 8-22 mins PT General Charges $$ ACUTE PT VISIT: 1 Visit                     Dorothyann Maier, DPT, CLT  Acute Rehabilitation Services Office: [phone] (Secure chat preferred)    Dorothyann VEAR Maier 11/08/2023, 2:07 PM

## 2023-11-08 NOTE — Progress Notes (Addendum)
 [address] [place] [employer] [address]             [phone]      6 Days Post-Op Procedure(s) (LRB): CORONARY ARTERY BYPASS GRAFTING (CABG) TIMES THREE USING LEFT INTERNAL MAMMERY ARTERY, RIGHT RADIAL ARTERY,  AND ENDOSCOPICALLY HARVESTED RIGHT GREATER SAPHENOUS VEIN (N/A) SURGICAL PROCUREMENT, ARTERY, RADIAL (Right) ECHOCARDIOGRAM, TRANSESOPHAGEAL (N/A) Subjective: Patient reports he is feeling better this AM. He reports his cough has improved and he is able to cough up sputum more easily.  Objective: Vital signs in last 24 hours: Temp:  [97.8 F (36.6 C)-99.5 F (37.5 C)] 99.1 F (37.3 C) (07/09 0417) Pulse Rate:  [95-122] 95 (07/09 0417) Cardiac Rhythm: Sinus tachycardia (07/08 1900) Resp:  [16-20] 20 (07/09 0417) BP: (104-125)/(60-81) 121/76 (07/09 0417) SpO2:  [94 %-99 %] 99 % (07/09 0417) Weight:  [120.5 kg] 120.5 kg (07/09 0602)  Hemodynamic parameters for last 24 hours:    Intake/Output from previous day: 07/08 0701 - 07/09 0700 In: 1080 [P.O.:1080] Out: 4550 [Urine:4550] Intake/Output this shift: No intake/output data recorded.  General appearance: alert, cooperative, and no distress Neurologic: intact Heart: sinus tachycardia, no murmur Lungs: diminished left basilar breath sounds, coarse rhonchi throughout slightly improved Abdomen: soft, non-tender; bowel sounds normal; no masses,  no organomegaly Extremities: extremities normal, atraumatic, no cyanosis or edema Wound: Clean and dry without sign of infection, right hand neurovascularly intact  Lab Results: Recent Labs    11/06/23 0500 11/07/23 0425  WBC 14.1* 14.3*  HGB 10.7* 10.7*  HCT 32.5* 32.3*  PLT 214 272   BMET:  Recent Labs    11/07/23 0425 11/08/23 0323  NA 135 132*  K 3.6 4.0  CL 90* 93*  CO2 33* 31  GLUCOSE 109* 100*  BUN 26* 28*  CREATININE 1.30* 1.28*  CALCIUM  8.9 8.5*    PT/INR: No results for input(s): LABPROT, INR in the last 72 hours. ABG     Component Value Date/Time   PHART 7.372 11/02/2023 1933   HCO3 20.1 11/02/2023 1933   TCO2 21 (L) 11/02/2023 1933   ACIDBASEDEF 4.0 (H) 11/02/2023 1933   O2SAT 65.2 11/06/2023 0500   CBG (last 3)  Recent Labs    11/07/23 1643 11/07/23 2107 11/08/23 0604  GLUCAP 92 119* 108*    Assessment/Plan: S/P Procedure(s) (LRB): CORONARY ARTERY BYPASS GRAFTING (CABG) TIMES THREE USING LEFT INTERNAL MAMMERY ARTERY, RIGHT RADIAL ARTERY,  AND ENDOSCOPICALLY HARVESTED RIGHT GREATER SAPHENOUS VEIN (N/A) SURGICAL PROCUREMENT, ARTERY, RADIAL (Right) ECHOCARDIOGRAM, TRANSESOPHAGEAL (N/A)  Neuro: Pain overall controlled this AM, continue pain regimen   CV: Hx of NSTEMI, on Plavix . No ASA due to allergy. HFrEF, AHF team following and assisting with GDMT titration. On Toprol  XL 12.5mg  daily, Jardiance  10mg , and spironolactone  25mg  daily. AHF to add low dose ARB if patient will tolerate. SBP 121 this AM. NSR-ST, HR 90s-120s with PVCs. Will titrate Toprol  XL to 25mg  daily.  On Norvasc  2.5mg  daily for radial conduit.    Pulm: On empiric Levofloxacin  x 5 days for possible pneumonia vs atelectasis with fever and leukocytosis now improving. Small left pleural effusion likely not enough for thoracentesis and anatomy would make it difficult for thoracentesis due to previous thoracotomy. May need to add Lasix  if this worsens. Left basilar atelectasis. Encourage IS, flutter valve, ambulation. Continue nebs and mucinex . On 3L [place] this AM saturating 100%, wean oxygen as tolerated for O2>90%.    GI: Last BM 07/02, will add  lactulose  today. Passing gas, tolerating a diet. No nausea.    Endo: No DM. CBGs controlled 95/123/115. SSI and CBGs have been d/c'd.    Renal: Cr 1.28, stable after holding diuresis. UO 4550cc/24hrs. Under preop weight. K 4.0, at goal.    ID: On empiric Levofloxacin  day 3 for possible pneumonia. Leukocytosis trending down, WBC 14.3. Tmax 99.5. Continue to monitor.    Expected postop ABLA: Last  H/H 10.7/32.2, stable. Not clinically significant at this time.    DVT Prophylaxis: Lovenox    Dispo: Ambulating well enough will no longer a CIR candidate. TOC having difficulty find a home health agency that will provide services for him, and if they find one they will only provide 3 visits. Will likely need to keep in the hospital until he can be discharged to home independently without home health. Patient to go home with his wife. Hopefully can d/c at the end of the week if patient continues to progress.    LOS: 11 days    Con GORMAN [place], [place] 11/08/2023  Agree Titrating meds Dispo planning  Loreto Loescher O Miracle [place]

## 2023-11-08 NOTE — Progress Notes (Signed)
 Mobility Specialist Progress Note:    11/08/23 1030  Mobility  Activity Ambulated with assistance to bathroom  Level of Assistance Standby assist, set-up cues, supervision of patient - no hands on  Assistive Device Front wheel walker  Distance Ambulated (ft) 300 ft  RUE Weight Bearing Per Provider Order NWB  LUE Weight Bearing Per Provider Order NWB  Activity Response Tolerated well  Mobility Referral Yes  Mobility visit 1 Mobility  Mobility Specialist Start Time (ACUTE ONLY) 1030  Mobility Specialist Stop Time (ACUTE ONLY) 1035  Mobility Specialist Time Calculation (min) (ACUTE ONLY) 5 min   Pt requesting to ambulate in hallway. SBA with RW for safety. SpO2 94% on RA throughout. Max HR 130's. Returned pt to room, requested to use bathroom. Void successful. Returned pt to chair with all needs met.    Naylani Bradner Mobility Specialist Please contact via Special educational needs teacher or  Rehab office at [phone]

## 2023-11-09 DIAGNOSIS — I214 Non-ST elevation (NSTEMI) myocardial infarction: Secondary | ICD-10-CM | POA: Diagnosis not present

## 2023-11-09 LAB — GLUCOSE, CAPILLARY
Glucose-Capillary: 103 mg/dL — ABNORMAL HIGH (ref 70–99)
Glucose-Capillary: 107 mg/dL — ABNORMAL HIGH (ref 70–99)
Glucose-Capillary: 88 mg/dL (ref 70–99)

## 2023-11-09 LAB — BASIC METABOLIC PANEL WITH GFR
Anion gap: 10 (ref 5–15)
BUN: 20 mg/dL (ref 6–20)
CO2: 25 mmol/L (ref 22–32)
Calcium: 9 mg/dL (ref 8.9–10.3)
Chloride: 99 mmol/L (ref 98–111)
Creatinine, Ser: 1.05 mg/dL (ref 0.61–1.24)
GFR, Estimated: >60 mL/min (ref >60)
Glucose, Bld: 107 mg/dL — ABNORMAL HIGH (ref 70–99)
Potassium: 3.6 mmol/L (ref 3.5–5.1)
Sodium: 134 mmol/L — ABNORMAL LOW (ref 135–145)

## 2023-11-09 LAB — MAGNESIUM: Magnesium: 2 mg/dL (ref 1.7–2.4)

## 2023-11-09 MED ORDER — METOPROLOL SUCCINATE ER 25 MG PO TB24
25.0000 mg | ORAL_TABLET | Freq: Once | ORAL | Status: AC
  Administered 2023-11-09: 25 mg via ORAL
  Filled 2023-11-09: qty 1

## 2023-11-09 MED ORDER — POTASSIUM CHLORIDE CRYS ER 20 MEQ PO TBCR
40.0000 meq | EXTENDED_RELEASE_TABLET | Freq: Once | ORAL | Status: AC
  Administered 2023-11-09: 40 meq via ORAL
  Filled 2023-11-09: qty 2

## 2023-11-09 MED ORDER — METOPROLOL SUCCINATE ER 50 MG PO TB24
50.0000 mg | ORAL_TABLET | Freq: Every day | ORAL | Status: DC
  Administered 2023-11-10: 50 mg via ORAL
  Filled 2023-11-09: qty 1

## 2023-11-09 MED FILL — Lidocaine HCl Local Preservative Free (PF) Inj 2%: INTRAMUSCULAR | Qty: 14 | Status: AC

## 2023-11-09 MED FILL — Potassium Chloride Inj 2 mEq/ML: INTRAVENOUS | Qty: 40 | Status: AC

## 2023-11-09 MED FILL — Heparin Sodium (Porcine) Inj 1000 Unit/ML: Qty: 1000 | Status: AC

## 2023-11-09 NOTE — Progress Notes (Addendum)
 Patient ID: Brian Caldwell, male   DOB: Nov 29, 1975, 48 y.o.   MRN: [id number]     Advanced Heart Failure Rounding Note  Cardiologist: Alvan Carrier, MD  Chief Complaint: NSTEMI Subjective:   - Pre-op echo with EF 30-35%, normal RV.  - S/p CABGx3 11/02/23: (LIMA-LAD, Rt Radial Artery- PDA, SVG-PLOM)  Weight stable. Remains below pre-op weight. ST 110s.   Continues to feel better/stronger. Denies CP/SOB. Sitting up in chair currently. Reports walking in the halls.   Objective:   Weight Range: 118.1 kg Body mass index is 35.3 kg/m.   Vital Signs:   Temp:  [98.1 F (36.7 C)-99.1 F (37.3 C)] 98.5 F (36.9 C) (07/10 0727) Pulse Rate:  [100-110] 109 (07/10 0727) Resp:  [17-19] 18 (07/10 0727) BP: (104-128)/(71-77) 117/72 (07/10 0727) SpO2:  [94 %-98 %] 96 % (07/10 0727) Weight:  [118.1 kg] 118.1 kg (07/10 0500) Last BM Date : 11/08/23  Weight change: Filed Weights   11/07/23 0500 11/08/23 0602 11/09/23 0500  Weight: 122.2 kg 120.5 kg 118.1 kg   Intake/Output:   Intake/Output Summary (Last 24 hours) at 11/09/2023 0838 Last data filed at 11/09/2023 0729 Gross per 24 hour  Intake 0 ml  Output 1600 ml  Net -1600 ml    Physical Exam  General:  well appearing.  No respiratory difficulty Neck: supple. JVD flat.  Cor: PMI nondisplaced. Tachy/regular rate & rhythm. No rubs, gallops or murmurs. Lungs: coarse bases Extremities: no cyanosis, clubbing, rash, edema  Neuro: alert & oriented x 3. Moves all 4 extremities w/o difficulty. Affect pleasant.   Telemetry   ST 110s (Personally reviewed)    Labs    CBC Recent Labs    11/07/23 0425  WBC 14.3*  HGB 10.7*  HCT 32.3*  MCV 95.8  PLT 272   Basic Metabolic Panel Recent Labs    92/90/74 0323 11/09/23 0335  NA 132* 134*  K 4.0 3.6  CL 93* 99  CO2 31 25  GLUCOSE 100* 107*  BUN 28* 20  CREATININE 1.28* 1.05  CALCIUM  8.5* 9.0  MG  --  2.0   Liver Function Tests No results for input(s): AST, ALT,  ALKPHOS, BILITOT, PROT, ALBUMIN  in the last 72 hours. No results for input(s): LIPASE, AMYLASE in the last 72 hours. Cardiac Enzymes No results for input(s): CKTOTAL, CKMB, CKMBINDEX, TROPONINI in the last 72 hours.  BNP: BNP (last 3 results) No results for input(s): BNP in the last 8760 hours.  ProBNP (last 3 results) No results for input(s): PROBNP in the last 8760 hours.   D-Dimer No results for input(s): DDIMER in the last 72 hours. Hemoglobin A1C No results for input(s): HGBA1C in the last 72 hours.  Fasting Lipid Panel No results for input(s): CHOL, HDL, LDLCALC, TRIG, CHOLHDL, LDLDIRECT in the last 72 hours.  Thyroid  Function Tests No results for input(s): TSH, T4TOTAL, T3FREE, THYROIDAB in the last 72 hours.  Invalid input(s): FREET3  Other results:   Imaging  No results found.  Medications:   Scheduled Medications:  amLODipine   2.5 mg Oral Daily   atorvastatin   80 mg Oral Daily   bisacodyl   10 mg Oral Daily   Or   bisacodyl   10 mg Rectal Daily   clopidogrel   75 mg Oral Daily   digoxin   0.125 mg Oral Daily   docusate sodium   200 mg Oral Daily   empagliflozin   10 mg Oral Daily   enoxaparin  (LOVENOX ) injection  40 mg Subcutaneous QHS   guaiFENesin   600 mg Oral BID   lactulose   20 g Oral Daily   levofloxacin   750 mg Oral Daily   losartan   12.5 mg Oral QHS   metoprolol  succinate  25 mg Oral Daily   nicotine   21 mg Transdermal Daily   pantoprazole   40 mg Oral Daily   polyethylene glycol  17 g Oral Daily   potassium chloride   40 mEq Oral Once   spironolactone   25 mg Oral Daily    Infusions:     PRN Medications: ALPRAZolam , levalbuterol , metoprolol  tartrate, ondansetron  (ZOFRAN ) IV, mouth rinse, oxyCODONE , traMADol  Patient Profile   Brian Caldwell is a 48 y.o. male with CAD with previous PCI in 48' and 16', HTN, HLD, hx of traumatic aortic dissection s/p repair and tobacco abuse. Admitted with late  presenting NSTEMI.   Assessment/Plan  1. NSTEMI / CAD - Hx STEMI 56' s/p bare-metal stent to LAD - Hx NSTEMI 40' s/p LCx and OM stent, PTCA of jailed LCx, RCA CTO with left-to-right collaterals  - LHC 21' PTCA to LCx (ISR) and stent to LAD  - LHC this admission with subtotally occluded proximal and distal LAD with thrombus and plaque formation in and around his old LAD stents. There was ~50% ISR of the LCx/OM stent as well and CTO of the RCA. Now s/p angioplasty of the proximal and distal LAD with restoration of flow. Unable to stent anything - S/p CABGx3 11/02/23: (LIMA-LAD, Rt Radial Artery- PDA, SVG-PLOM) - on low dose amlodipine  for radial graft - Continue statin.  - Continue clopidogrel  (ASA allergy).  - No current s/s angina - Continue to mobilize   2. HFrEF, iCM - Echo 6/30: EF 30-35%, LV with RWMA, RV normal, trivial MR - NYHA IV on admission - Stable off pressors/inotropes - Appears euvolemic on exam. Does not need loop diuretic - Continue Jardiance  10 mg daily  - Increase Toprol  XL 25>50mg  daily to try to improve rate - Continue spiro 25 mg daily  - Continue Losartan  12.5  at bedtime - Continue digoxin  0.125 mg daily for rate control   3. HLD - LDL 51 10/29/23 - Continue statin, atorva 80 mg     4. Tobacco abuse - Still smoking 2-3 PPD - imperative to stop smoking - Nicotine  patch  5. ID - Levofloxacin  started on 7/6 for possible COPD flare - WBC previously improving - CXR with LLL atx and effusion.  Evaluated with CCM personally under u/s. -> has fluid there but anatomy complicated by prvious thoracotomy. Will focus on IS, abx and diuresis. If needs tap can re-valuate under CT guidance - D/w CCM, continue levofloxacin  x 5 days, end-date 7/11   Beckey LITTIE Coe, NP  8:38 AM  Patient seen with NP, I formulated the plan and agree with the above note.   Stable today, breathing improving.  Still with sinus tachycardia.   General: NAD Neck: No JVD, no thyromegaly or  thyroid  nodule.  Lungs: Decreased at bases.  CV: Nondisplaced PMI.  Heart regular S1/S2, no S3/S4, no murmur.  No peripheral edema.  Abdomen: Soft, nontender, no hepatosplenomegaly, no distention.  Skin: Intact without lesions or rashes.  Neurologic: Alert and oriented x 3.  Psych: Normal affect. Extremities: No clubbing or cyanosis.  HEENT: Normal.   Pt is on levofloxacin  until 7/11 for possible COPD flare.  Denies dyspnea.    Volume status looks good, does not need loop diuretic. Increase Toprol  XL to 50 mg daily for persistent sinus tachycardia.  If BP remains  stable, would transition losartan  to Entresto eventually.    Home tomorrow most likely.   Ezra Shuck 11/09/2023 10:08 AM

## 2023-11-09 NOTE — Progress Notes (Addendum)
 [address] [place] [employer] [address]             [phone]      7 Days Post-Op Procedure(s) (LRB): CORONARY ARTERY BYPASS GRAFTING (CABG) TIMES THREE USING LEFT INTERNAL MAMMERY ARTERY, RIGHT RADIAL ARTERY,  AND ENDOSCOPICALLY HARVESTED RIGHT GREATER SAPHENOUS VEIN (N/A) SURGICAL PROCUREMENT, ARTERY, RADIAL (Right) ECHOCARDIOGRAM, TRANSESOPHAGEAL (N/A) Subjective: Patient reports his breathing feels much better and he was able to walk without the walker yesterday.   Objective: Vital signs in last 24 hours: Temp:  [98.1 F (36.7 C)-99.1 F (37.3 C)] 98.1 F (36.7 C) (07/10 0300) Pulse Rate:  [100-110] 104 (07/10 0300) Cardiac Rhythm: Sinus tachycardia (07/09 2000) Resp:  [17-20] 18 (07/10 0300) BP: (104-128)/(71-77) 120/71 (07/10 0300) SpO2:  [94 %-100 %] 94 % (07/10 0300) Weight:  [118.1 kg] 118.1 kg (07/10 0500)  Hemodynamic parameters for last 24 hours:    Intake/Output from previous day: 07/09 0701 - 07/10 0700 In: -  Out: 1600 [Urine:1600] Intake/Output this shift: No intake/output data recorded.  General appearance: alert, cooperative, and no distress Neurologic: intact Heart: sinus tachycardia, no murmur Lungs: coarse rhonchi throughout, improved after cough Abdomen: soft, non-tender; bowel sounds normal; no masses,  no organomegaly Extremities: extremities normal, atraumatic, no cyanosis or edema Wound: Clean and dry without sign of infection, right hand neurovascularly intact  Lab Results: Recent Labs    11/07/23 0425  WBC 14.3*  HGB 10.7*  HCT 32.3*  PLT 272   BMET:  Recent Labs    11/08/23 0323 11/09/23 0335  NA 132* 134*  K 4.0 3.6  CL 93* 99  CO2 31 25  GLUCOSE 100* 107*  BUN 28* 20  CREATININE 1.28* 1.05  CALCIUM  8.5* 9.0    PT/INR: No results for input(s): LABPROT, INR in the last 72 hours. ABG    Component Value Date/Time   PHART 7.372 11/02/2023 1933   HCO3 20.1 11/02/2023 1933   TCO2 21 (L)  11/02/2023 1933   ACIDBASEDEF 4.0 (H) 11/02/2023 1933   O2SAT 65.2 11/06/2023 0500   CBG (last 3)  Recent Labs    11/08/23 0604 11/08/23 2144 11/09/23 0542  GLUCAP 108* 99 103*    Assessment/Plan: S/P Procedure(s) (LRB): CORONARY ARTERY BYPASS GRAFTING (CABG) TIMES THREE USING LEFT INTERNAL MAMMERY ARTERY, RIGHT RADIAL ARTERY,  AND ENDOSCOPICALLY HARVESTED RIGHT GREATER SAPHENOUS VEIN (N/A) SURGICAL PROCUREMENT, ARTERY, RADIAL (Right) ECHOCARDIOGRAM, TRANSESOPHAGEAL (N/A)  Neuro: Pain overall controlled this AM, continue pain regimen   CV: Hx of NSTEMI, on Plavix . No ASA due to allergy. HFrEF, AHF team following and assisting with GDMT titration. On Jardiance  10mg , Spironolactone  25mg  daily, and Losartan  12.5mg  daily. SBP 120 this AM. ST, HR 110s with PVCs, on Toprol  XL 25mg  daily and Digoxin  0.125mg  daily. Would like better heart rate control, titrate Toprol  XL vs Digoxin  for sinus tachycardia? On Norvasc  2.5mg  daily for radial conduit.    Pulm: On empiric Levofloxacin  x 5 days (end date 7/11) for possible pneumonia vs atelectasis with fever and leukocytosis now improving. Small left pleural effusion likely not enough for thoracentesis and anatomy would make it difficult for thoracentesis due to previous thoracotomy. May need to add Lasix  if this worsens. Left basilar atelectasis. Encourage IS, flutter valve, ambulation. Continue nebs and mucinex . On RA this AM saturating 94%.   GI: +BM yesterday. Tolerating a diet. No nausea.    Endo: No DM. CBGs controlled 95/123/115. SSI and CBGs have been d/c'd.  Renal: Cr 1.05, improved after holding diuresis. UO 1600cc/24hrs. Under preop weight. K 3.6, supplement.    ID: On empiric Levofloxacin  day 3 for possible pneumonia. Leukocytosis trending down, WBC 14.3. Tmax 99.1. Continue to monitor.    Expected postop ABLA: Last H/H 10.7/32.2, stable. Not clinically significant at this time.    DVT Prophylaxis: Lovenox    Dispo: No home health  agency will provide services for him. Plan to discharge home with wife once he is stable for discharge. Hopefully can discharge tomorrow AM, will discuss with surgeon and AHF team.    LOS: 12 days    Con GORMAN Bend, PA-C 11/09/2023   Agree Dispo planning  Linnie [name]

## 2023-11-09 NOTE — Progress Notes (Signed)
 Physical Therapy Discharge Note Patient Details Name: Brian Caldwell MRN: [id number] DOB: 1975/10/01 Today's Date: 11/09/2023   History of Present Illness 48 yo M admitted 6/28; who presented to [facility] 6/28 with chest pain,  w NSTEMI and transferred to [facility]. Cathed 6/30, unable to stent. S/p CABGx3 11/02/23 and lysis of adhesions. PMH CAD, ASA allergy, HTN, HLD, ICM.    PT Comments  Pt has met all of his goals. Pt is able to maintain sternal precautions and is Mod I to ind with all functional mobility without an AD. Currently pt is presenting at baseline level of functioning and no skilled physical therapy services recommended. Pt will be discharged from skilled physical therapy services at this time; please re-consult if further needs arise.        If plan is discharge home, recommend the following: Other (comment) (as needed assistance.)     Equipment Recommendations  None recommended by PT       Precautions / Restrictions Precautions Precautions: Sternal Precaution Booklet Issued: Yes (comment) Recall of Precautions/Restrictions: Intact Restrictions Weight Bearing Restrictions Per Provider Order: No RUE Weight Bearing Per Provider Order: Non weight bearing LUE Weight Bearing Per Provider Order: Non weight bearing Other Position/Activity Restrictions: sternal precautions     Mobility  Bed Mobility   General bed mobility comments: Pt sitting up in recliner upon arrival and departure.    Transfers Overall transfer level: Independent Equipment used: None Transfers: Sit to/from Stand Sit to Stand: Independent    Ambulation/Gait Ambulation/Gait assistance: Modified independent (Device/Increase time) Gait Distance (Feet): 250 Feet Assistive device: None Gait Pattern/deviations: Step-through pattern, Decreased stride length Gait velocity: mildly decreased Gait velocity interpretation: 1.31 - 2.62 ft/sec, indicative of limited community ambulator   General Gait Details: Pt HR  remaining WNL throughout session.   Stairs Stairs: Yes Stairs assistance: Modified independent (Device/Increase time) Stair Management: One rail Left, Step to pattern Number of Stairs: 1 General stair comments: per home set up      Balance Overall balance assessment: Modified Independent Sitting-balance support: No upper extremity supported, Feet supported Sitting balance-Leahy Scale: Good     Standing balance support: No upper extremity supported Standing balance-Leahy Scale: Good        Communication Communication Communication: No apparent difficulties  Cognition Arousal: Alert Behavior During Therapy: WFL for tasks assessed/performed   PT - Cognitive impairments: No apparent impairments       PT - Cognition Comments: good precautions adherence today Following commands: Intact                Pertinent Vitals/Pain Pain Assessment Pain Assessment: Faces Faces Pain Scale: Hurts little more Pain Location: chest Pain Descriptors / Indicators: Discomfort Pain Intervention(s): Monitored during session           PT Goals (current goals can now be found in the care plan section) Progress towards PT goals: Goals met/education completed, patient discharged from PT       PT Plan  Dc PT services       AM-PAC PT 6 Clicks Mobility   Outcome Measure  Help needed turning from your back to your side while in a flat bed without using bedrails?: None Help needed moving from lying on your back to sitting on the side of a flat bed without using bedrails?: None Help needed moving to and from a bed to a chair (including a wheelchair)?: None Help needed standing up from a chair using your arms (e.g., wheelchair or bedside chair)?: None Help needed  to walk in hospital room?: None Help needed climbing 3-5 steps with a railing? : None 6 Click Score: 24    End of Session Equipment Utilized During Treatment: Gait belt Activity Tolerance: Patient tolerated treatment  well Patient left: in chair;with call bell/phone within reach Nurse Communication: Mobility status PT Visit Diagnosis: Unsteadiness on feet (R26.81);Other abnormalities of gait and mobility (R26.89);Muscle weakness (generalized) (M62.81);Difficulty in walking, not elsewhere classified (R26.2);Pain     Time: 8856-8846 PT Time Calculation (min) (ACUTE ONLY): 10 min  Charges:    $Therapeutic Activity: 8-22 mins PT General Charges $$ ACUTE PT VISIT: 1 Visit                    Dorothyann Maier, DPT, CLT  Acute Rehabilitation Services Office: [phone] (Secure chat preferred)   Dorothyann VEAR Maier 11/09/2023, 11:56 AM

## 2023-11-10 ENCOUNTER — Inpatient Hospital Stay (HOSPITAL_COMMUNITY)

## 2023-11-10 ENCOUNTER — Other Ambulatory Visit (HOSPITAL_COMMUNITY): Payer: Self-pay

## 2023-11-10 DIAGNOSIS — I214 Non-ST elevation (NSTEMI) myocardial infarction: Secondary | ICD-10-CM | POA: Diagnosis not present

## 2023-11-10 LAB — CBC
HCT: 36.5 % — ABNORMAL LOW (ref 39.0–52.0)
Hemoglobin: 12.2 g/dL — ABNORMAL LOW (ref 13.0–17.0)
MCH: 32 pg (ref 26.0–34.0)
MCHC: 33.4 g/dL (ref 30.0–36.0)
MCV: 95.8 fL (ref 80.0–100.0)
Platelets: 387 K/uL (ref 150–400)
RBC: 3.81 MIL/uL — ABNORMAL LOW (ref 4.22–5.81)
RDW: 13.4 % (ref 11.5–15.5)
WBC: 16.1 K/uL — ABNORMAL HIGH (ref 4.0–10.5)
nRBC: 0 % (ref 0.0–0.2)

## 2023-11-10 LAB — BASIC METABOLIC PANEL WITH GFR
Anion gap: 13 (ref 5–15)
BUN: 18 mg/dL (ref 6–20)
CO2: 20 mmol/L — ABNORMAL LOW (ref 22–32)
Calcium: 9.1 mg/dL (ref 8.9–10.3)
Chloride: 102 mmol/L (ref 98–111)
Creatinine, Ser: 0.9 mg/dL (ref 0.61–1.24)
GFR, Estimated: >60 mL/min (ref >60)
Glucose, Bld: 97 mg/dL (ref 70–99)
Potassium: 4.2 mmol/L (ref 3.5–5.1)
Sodium: 135 mmol/L (ref 135–145)

## 2023-11-10 MED ORDER — AMLODIPINE BESYLATE 2.5 MG PO TABS
2.5000 mg | ORAL_TABLET | Freq: Every day | ORAL | 0 refills | Status: DC
  Filled 2023-11-10: qty 36, 36d supply, fill #0

## 2023-11-10 MED ORDER — SPIRONOLACTONE 25 MG PO TABS
25.0000 mg | ORAL_TABLET | Freq: Every day | ORAL | 1 refills | Status: DC
  Filled 2023-11-10: qty 30, 30d supply, fill #0

## 2023-11-10 MED ORDER — SACUBITRIL-VALSARTAN 24-26 MG PO TABS
1.0000 | ORAL_TABLET | Freq: Two times a day (BID) | ORAL | 1 refills | Status: DC
  Filled 2023-11-10: qty 60, 30d supply, fill #0

## 2023-11-10 MED ORDER — NICOTINE 21 MG/24HR TD PT24: 21.0000 mg | MEDICATED_PATCH | Freq: Every day | TRANSDERMAL | Status: DC

## 2023-11-10 MED ORDER — EMPAGLIFLOZIN 10 MG PO TABS
10.0000 mg | ORAL_TABLET | Freq: Every day | ORAL | 1 refills | Status: DC
  Filled 2023-11-10: qty 30, 30d supply, fill #0

## 2023-11-10 MED ORDER — METOPROLOL SUCCINATE ER 50 MG PO TB24
50.0000 mg | ORAL_TABLET | Freq: Every day | ORAL | 1 refills | Status: DC
  Filled 2023-11-10: qty 30, 30d supply, fill #0

## 2023-11-10 MED ORDER — SACUBITRIL-VALSARTAN 24-26 MG PO TABS
1.0000 | ORAL_TABLET | Freq: Two times a day (BID) | ORAL | Status: DC
  Administered 2023-11-10: 1 via ORAL
  Filled 2023-11-10: qty 1

## 2023-11-10 MED ORDER — DIGOXIN 125 MCG PO TABS
0.1250 mg | ORAL_TABLET | Freq: Every day | ORAL | 1 refills | Status: DC
  Filled 2023-11-10: qty 30, 30d supply, fill #0

## 2023-11-10 MED ORDER — CLOPIDOGREL BISULFATE 75 MG PO TABS
75.0000 mg | ORAL_TABLET | Freq: Every day | ORAL | 1 refills | Status: DC
  Filled 2023-11-10: qty 30, 30d supply, fill #0

## 2023-11-10 MED ORDER — OXYCODONE HCL 5 MG PO TABS
5.0000 mg | ORAL_TABLET | Freq: Four times a day (QID) | ORAL | 0 refills | Status: DC | PRN
  Filled 2023-11-10: qty 28, 5d supply, fill #0

## 2023-11-10 NOTE — Progress Notes (Addendum)
 [address] [place] [employer] [address]             [phone]      8 Days Post-Op Procedure(s) (LRB): CORONARY ARTERY BYPASS GRAFTING (CABG) TIMES THREE USING LEFT INTERNAL MAMMERY ARTERY, RIGHT RADIAL ARTERY,  AND ENDOSCOPICALLY HARVESTED RIGHT GREATER SAPHENOUS VEIN (N/A) SURGICAL PROCUREMENT, ARTERY, RADIAL (Right) ECHOCARDIOGRAM, TRANSESOPHAGEAL (N/A) Subjective: Patient with no new complaints this AM, states he is excited to go home. He feels like his elevated heart rate is due to pain.   Objective: Vital signs in last 24 hours: Temp:  [98.1 F (36.7 C)-99.1 F (37.3 C)] 98.3 F (36.8 C) (07/11 0300) Pulse Rate:  [106-111] 109 (07/11 0329) Cardiac Rhythm: Sinus tachycardia (07/11 0329) Resp:  [18-20] 20 (07/11 0300) BP: (96-128)/(64-80) 117/70 (07/11 0300) SpO2:  [95 %-99 %] 95 % (07/11 0329) Weight:  [110 kg] 110 kg (07/11 0621)  Hemodynamic parameters for last 24 hours:    Intake/Output from previous day: 07/10 0701 - 07/11 0700 In: 490 [P.O.:490] Out: 425 [Urine:425] Intake/Output this shift: No intake/output data recorded.  General appearance: alert, cooperative, and no distress Neurologic: intact Heart: sinus tachyardia, no murmur Lungs: no rhonchi, no wheezing, clear to auscultation this AM Abdomen: soft, non-tender; bowel sounds normal; no masses,  no organomegaly Extremities: extremities normal, atraumatic, no cyanosis or edema Wound: Clean and dry without sign of infection  Lab Results: No results for input(s): WBC, HGB, HCT, PLT in the last 72 hours. BMET:  Recent Labs    11/08/23 0323 11/09/23 0335  NA 132* 134*  K 4.0 3.6  CL 93* 99  CO2 31 25  GLUCOSE 100* 107*  BUN 28* 20  CREATININE 1.28* 1.05  CALCIUM  8.5* 9.0    PT/INR: No results for input(s): LABPROT, INR in the last 72 hours. ABG    Component Value Date/Time   PHART 7.372 11/02/2023 1933   HCO3 20.1 11/02/2023 1933   TCO2 21 (L) 11/02/2023  1933   ACIDBASEDEF 4.0 (H) 11/02/2023 1933   O2SAT 65.2 11/06/2023 0500   CBG (last 3)  Recent Labs    11/09/23 0542 11/09/23 1213 11/09/23 1623  GLUCAP 103* 107* 88    Assessment/Plan: S/P Procedure(s) (LRB): CORONARY ARTERY BYPASS GRAFTING (CABG) TIMES THREE USING LEFT INTERNAL MAMMERY ARTERY, RIGHT RADIAL ARTERY,  AND ENDOSCOPICALLY HARVESTED RIGHT GREATER SAPHENOUS VEIN (N/A) SURGICAL PROCUREMENT, ARTERY, RADIAL (Right) ECHOCARDIOGRAM, TRANSESOPHAGEAL (N/A)  Neuro: Pain overall controlled this AM, continue pain regimen   CV: Hx of NSTEMI, on Plavix . No ASA due to allergy. HFrEF, AHF team following and assisting with GDMT titration. On Jardiance  10mg , Spironolactone  25mg  daily, and Losartan  12.5mg  daily. SBP 120 this AM. ST, HR 110s with PVCs, on Toprol  XL 50mg  daily and Digoxin  0.125mg  daily. On Norvasc  2.5mg  daily for radial conduit. SBP 96-117. Will get EKG this AM to confirm sinus tachycardia. BP restrictive of continued BB titration.    Pulm: On empiric Levofloxacin  x 5 days (end date 7/11) for possible pneumonia vs atelectasis with fever and leukocytosis now improving. CXR with improved small left pleural effusion and bibasilar atelectasis. Encourage IS, flutter valve, ambulation. Continue nebs and mucinex . Saturating well on RA this AM   GI: +BM. Tolerating a diet. No nausea.    Endo: No DM. CBGs controlled 95/123/115. SSI and CBGs have been d/c'd.    Renal: Last Cr 1.05, improved after holding diuresis. UO 425cc/24hrs recorded. Under preop weight but down 8kg from  yesterday, likely not accurate. Last K 3.6, no new labs due to no IV access   ID: On empiric Levofloxacin  for possible pneumonia. Last leukocytosis trending down, WBC 14.3. Tmax 99.1 on 1 occasion but otherwise 98. Continue to monitor.    Expected postop ABLA: Last H/H 10.7/32.2, stable. Not clinically significant at this time.    DVT Prophylaxis: Lovenox    Dispo: No home health agency will provide services  for him. Plan to discharge home with wife once he is stable for discharge. Patient still with sinus tachycardia. Possibly discharge to home today vs tomorrow, will discuss with surgeon and AHF team.    LOS: 13 days    Con GORMAN Bend, PA-C 11/10/2023

## 2023-11-10 NOTE — Progress Notes (Signed)
 Plan of care is reviewed. Pt has been progressing. He is alert and fully oriented x 4, able to ambulate independently, afebrile, stable hemodynamically, ST on the monitor, HR 100-110, with occasional PVCs.    Pt is able to sleep well with no major complaints overnight. Pain is well tolerated. Mid sternal incision, right arm and right leg incision sites are cleaned with Betadine, open to air , negative for drainage.     We do not get a new IV accessed after the IV malpositioning and Pt is anticipated discharge at am. We will hand off to the day shift team.   Wendi Dash, RN

## 2023-11-10 NOTE — Progress Notes (Signed)
 Occupational Therapy Treatment Patient Details Name: Brian Caldwell MRN: [id number] DOB: 03-Jun-1975 Today's Date: 11/10/2023   History of present illness 48 yo M admitted 6/28; who presented to [facility] 6/28 with chest pain,  w NSTEMI and transferred to [facility]. Cathed 6/30, unable to stent. S/p CABGx3 11/02/23 and lysis of adhesions. PMH CAD, ASA allergy, HTN, HLD, ICM.   OT comments  Pt. Seen for skilled OT treatment session.  Pts. Wife present via speaker phone for 1st portion of tx session.  Pt. Able to verbalize and provide examples of compliance with sternal precautions.  S for in room ambulation to/from b.room with review of tub transfer and toilet transfer.  Provided energy conservation strategies for home. Pt. Verbalized understanding and able to describe some practices already in place.  Eager for d/c home when able.        If plan is discharge home, recommend the following:  A little help with walking and/or transfers;A little help with bathing/dressing/bathroom;Assistance with cooking/housework;Help with stairs or ramp for entrance;Assist for transportation   Equipment Recommendations  Tub/shower bench    Recommendations for Other Services      Precautions / Restrictions Precautions Precautions: Sternal Restrictions Other Position/Activity Restrictions: sternal precautions       Mobility Bed Mobility               General bed mobility comments: Pt sitting up in recliner upon arrival and departure.    Transfers Overall transfer level: Independent Equipment used: None Transfers: Sit to/from Stand Sit to Stand: Independent                 Balance                                           ADL either performed or assessed with clinical judgement   ADL Overall ADL's : Needs assistance/impaired     Grooming: Supervision/safety;Standing Grooming Details (indicate cue type and reason): observed standing by sink with rn giving pain meds. pt.  stood and held beverage cup and was able to manage the cup and medicine cup without issue                 Toilet Transfer: Supervision/safety;Ambulation Toilet Transfer Details (indicate cue type and reason): pt. able to ambulate to/from b.room (did not have to use )   Toileting - Clothing Manipulation Details (indicate cue type and reason): pt. states his wife is ordering a toilet aide for use at home   Tub/Shower Transfer Details (indicate cue type and reason): reviewed side step transfer over tub with reported L faucet.  pt. states he is ordering a shower chair off of [employer] for use at home also Functional mobility during ADLs: Supervision/safety General ADL Comments: pt. able to describe complaince with sternal precautions as he described his sponge bath that he completed sitting at the sink earlier.  reviewed energy conservation related to ADLs.    Extremity/Trunk Assessment              Vision       Perception     Praxis     Communication Communication Communication: No apparent difficulties   Cognition Arousal: Alert Behavior During Therapy: WFL for tasks assessed/performed  Following commands: Intact Following commands impaired: Only follows one step commands consistently      Cueing   Cueing Techniques: Verbal cues  Exercises      Shoulder Instructions       General Comments      Pertinent Vitals/ Pain       Pain Assessment Pain Assessment: No/denies pain  Home Living                                          Prior Functioning/Environment              Frequency  Min 2X/week        Progress Toward Goals  OT Goals(current goals can now be found in the care plan section)  Progress towards OT goals: Progressing toward goals     Plan      Co-evaluation                 AM-PAC OT 6 Clicks Daily Activity     Outcome Measure   Help from another person eating  meals?: None Help from another person taking care of personal grooming?: A Little Help from another person toileting, which includes using toliet, bedpan, or urinal?: A Little Help from another person bathing (including washing, rinsing, drying)?: A Little Help from another person to put on and taking off regular upper body clothing?: A Little Help from another person to put on and taking off regular lower body clothing?: A Little 6 Click Score: 19    End of Session    OT Visit Diagnosis: Unsteadiness on feet (R26.81);Muscle weakness (generalized) (M62.81);Other (comment)   Activity Tolerance Patient tolerated treatment well   Patient Left in chair;with call bell/phone within reach   Nurse Communication Other (comment) (rn present giving pain meds at beg. of apt.)        Time: 8956-8892 OT Time Calculation (min): 24 min  Charges: OT General Charges $OT Visit: 1 Visit OT Treatments $Self Care/Home Management : 23-37 mins  Randall, COTA/L Acute Rehabilitation [phone]   CHRISTELLA Nest Lorraine-COTA/L 11/10/2023, 12:35 PM

## 2023-11-10 NOTE — TOC Transition Note (Signed)
 Transition of Care (TOC) - Discharge Note Rayfield Gobble RN, BSN Inpatient Care Management Unit 4E- RN Case Manager See Treatment Team for direct phone #   Patient Details  Name: Brian Caldwell MRN: [id number] Date of Birth: 1976/02/17  Transition of Care [facility]) CM/SW Contact:  Gobble Rayfield Hurst, RN Phone Number: 11/10/2023, 12:04 PM   Clinical Narrative:    Pt stable for transition home today, No HH agency would accept for PT/OT needs. Per unit rounds provider stated no HH needed at discharge.   Orders for DME in- RW and 3:1- call made to [employer] liaison this am for DME referral- DME- RW and 3:1 to be delivered to pt prior to discharge.   Family to transport home.    Final next level of care: Home/Self Care Barriers to Discharge: Barriers Resolved   Patient Goals and CMS Choice Patient states their goals for this hospitalization and ongoing recovery are:: return home CMS Medicare.gov Compare Post Acute Care list provided to:: Patient Choice offered to / list presented to : Patient      Discharge Placement               Home        Discharge Plan and Services Additional resources added to the After Visit Summary for     Discharge Planning Services: CM Consult Post Acute Care Choice: Durable Medical Equipment, Home Health          DME Arranged: 3-N-1, Walker rolling DME Agency: [facility] Date DME Agency Contacted: 11/10/23 Time DME Agency Contacted: 1100 Representative spoke with at DME Agency: [facility] HH Arranged: PT, OT (no HH agency will accept) HH Agency: NA        Social Drivers of Health (SDOH) Interventions SDOH Screenings   Food Insecurity: No Food Insecurity (10/29/2023)  Housing: Low Risk  (10/29/2023)  Transportation Needs: No Transportation Needs (10/29/2023)  Utilities: Not At Risk (10/29/2023)  Tobacco Use: High Risk (11/02/2023)     Readmission Risk Interventions    11/10/2023   12:04 PM  Readmission Risk Prevention Plan  Post Dischage  Appt Complete  Medication Screening Complete  Transportation Screening Complete

## 2023-11-10 NOTE — Progress Notes (Addendum)
 Reviewed AVS, patient expressed understanding of medications, MD follow up reviewed.    Patient states all belongings brought to the hospital at time of admission are accounted for and packed to take home.  Patient informed and expressed understanding to pick up medications from [facility] pharmacy. Pt transported to Discharge lounge to wait for DME delivery and transportation home.

## 2023-11-10 NOTE — TOC Transition Note (Signed)
 Transition of Care [facility]) - Discharge Note   Patient Details  Name: Brian Caldwell MRN: [id number] Date of Birth: 09-09-1975  [facility] CM/SW Contact:  Justina Delcia Czar, RN Phone Number: [phone] 11/10/2023, 12:37 PM   Clinical Narrative:     Spoke to pt and wife. Pt has scale at home for daily weights. Reviewed PCP appt with patient. Wife states she will follow up on FMLA/STD paperwork.  Reviewed with pt Living Better with HF, daily weights and low sodium/heart healthy diet.   Insurance issue with DME delayed delivered. DME was approved. Contacted [facility], Mitch to drop ship DME to home.   Final next level of care: Home/Self Care Barriers to Discharge: Barriers Resolved   Patient Goals and CMS Choice Patient states their goals for this hospitalization and ongoing recovery are:: return home CMS Medicare.gov Compare Post Acute Care list provided to:: Patient Choice offered to / list presented to : Patient      Discharge Placement                       Discharge Plan and Services Additional resources added to the After Visit Summary for     Discharge Planning Services: CM Consult Post Acute Care Choice: Durable Medical Equipment, Home Health          DME Arranged: 3-N-1, Walker rolling DME Agency: [facility] Date DME Agency Contacted: 11/10/23 Time DME Agency Contacted: 1100 Representative spoke with at DME Agency: [facility] HH Arranged: PT, OT (no HH agency will accept) HH Agency: NA        Social Drivers of Health (SDOH) Interventions SDOH Screenings   Food Insecurity: No Food Insecurity (10/29/2023)  Housing: Low Risk  (10/29/2023)  Transportation Needs: No Transportation Needs (10/29/2023)  Utilities: Not At Risk (10/29/2023)  Tobacco Use: High Risk (11/02/2023)     Readmission Risk Interventions    11/10/2023   12:04 PM  Readmission Risk Prevention Plan  Post Dischage Appt Complete  Medication Screening Complete  Transportation  Screening Complete

## 2023-11-10 NOTE — Progress Notes (Addendum)
 Patient ID: Brian Caldwell, male   DOB: [birth year], 48 y.o.   MRN: [id number]     Advanced Heart Failure Rounding Note  Cardiologist: Alvan Carrier, MD  Chief Complaint: NSTEMI Subjective:   - Pre-op echo with EF 30-35%, normal RV.  - S/p CABGx3 11/02/23: (LIMA-LAD, Rt Radial Artery- PDA, SVG-PLOM)  Weight appears inaccurate, down 18lbs?. Remains below pre-op weight. ST low 100s.   Feels fine this morning. Asking about discharge. Denies CP/SOB.   Objective:   Weight Range: 110 kg Body mass index is 32.89 kg/m.   Vital Signs:   Temp:  [98.1 F (36.7 C)-99.1 F (37.3 C)] 98.3 F (36.8 C) (07/11 0758) Pulse Rate:  [100-111] 100 (07/11 0806) Resp:  [18-20] 20 (07/11 0758) BP: (96-128)/(64-80) 120/79 (07/11 0806) SpO2:  [95 %-99 %] 95 % (07/11 0329) Weight:  [110 kg] 110 kg (07/11 0621) Last BM Date : 11/09/23  Weight change: Filed Weights   11/08/23 0602 11/09/23 0500 11/10/23 0621  Weight: 120.5 kg 118.1 kg 110 kg   Intake/Output:   Intake/Output Summary (Last 24 hours) at 11/10/2023 0830 Last data filed at 11/10/2023 0300 Gross per 24 hour  Intake 490 ml  Output 425 ml  Net 65 ml    Physical Exam  General:  well appearing.  No respiratory difficulty Neck: supple. JVD flat.  Cor: PMI nondisplaced. Tachy/reg rate & rhythm. No rubs, gallops or murmurs. Lungs: clear, diminished bases Extremities: no cyanosis, clubbing, rash, edema  Neuro: alert & oriented x 3. Moves all 4 extremities w/o difficulty. Affect pleasant.   Telemetry   ST low 100s (Personally reviewed)    Labs    CBC No results for input(s): WBC, NEUTROABS, HGB, HCT, MCV, PLT in the last 72 hours.  Basic Metabolic Panel Recent Labs    92/90/74 0323 11/09/23 0335  NA 132* 134*  K 4.0 3.6  CL 93* 99  CO2 31 25  GLUCOSE 100* 107*  BUN 28* 20  CREATININE 1.28* 1.05  CALCIUM  8.5* 9.0  MG  --  2.0   Liver Function Tests No results for input(s): AST, ALT, ALKPHOS,  BILITOT, PROT, ALBUMIN  in the last 72 hours. No results for input(s): LIPASE, AMYLASE in the last 72 hours. Cardiac Enzymes No results for input(s): CKTOTAL, CKMB, CKMBINDEX, TROPONINI in the last 72 hours.  BNP: BNP (last 3 results) No results for input(s): BNP in the last 8760 hours.  ProBNP (last 3 results) No results for input(s): PROBNP in the last 8760 hours.   D-Dimer No results for input(s): DDIMER in the last 72 hours. Hemoglobin A1C No results for input(s): HGBA1C in the last 72 hours.  Fasting Lipid Panel No results for input(s): CHOL, HDL, LDLCALC, TRIG, CHOLHDL, LDLDIRECT in the last 72 hours.  Thyroid  Function Tests No results for input(s): TSH, T4TOTAL, T3FREE, THYROIDAB in the last 72 hours.  Invalid input(s): FREET3  Other results:  Imaging  DG Chest Port 1 View Result Date: 11/10/2023 CLINICAL DATA:  Cough and shortness of breath.  Status post CABG. EXAM: PORTABLE CHEST 1 VIEW COMPARISON:  11/07/2023 FINDINGS: The cardio pericardial silhouette is enlarged. There is pulmonary vascular congestion without overt pulmonary edema. Interval improvement in aeration at the left base with some residual minimal atelectasis. Telemetry leads overlie the chest. IMPRESSION: 1. Enlargement of the cardiopericardial silhouette with pulmonary vascular congestion. 2. Interval improvement in aeration at the left base with some residual minimal atelectasis. Electronically Signed   By: Camellia Candle M.D.   On:  11/10/2023 08:17    Medications:   Scheduled Medications:  amLODipine   2.5 mg Oral Daily   atorvastatin   80 mg Oral Daily   bisacodyl   10 mg Oral Daily   Or   bisacodyl   10 mg Rectal Daily   clopidogrel   75 mg Oral Daily   digoxin   0.125 mg Oral Daily   docusate sodium   200 mg Oral Daily   empagliflozin   10 mg Oral Daily   enoxaparin  (LOVENOX ) injection  40 mg Subcutaneous QHS   guaiFENesin   600 mg Oral BID   losartan    12.5 mg Oral QHS   metoprolol  succinate  50 mg Oral Daily   nicotine   21 mg Transdermal Daily   pantoprazole   40 mg Oral Daily   polyethylene glycol  17 g Oral Daily   spironolactone   25 mg Oral Daily    Infusions:   PRN Medications: ALPRAZolam , levalbuterol , metoprolol  tartrate, ondansetron  (ZOFRAN ) IV, mouth rinse, oxyCODONE , traMADol  Patient Profile   ANYELO Caldwell is a 48 y.o. male with CAD with previous PCI in 46' and 16', HTN, HLD, hx of traumatic aortic dissection s/p repair and tobacco abuse. Admitted with late presenting NSTEMI.   Assessment/Plan  1. NSTEMI / CAD - Hx STEMI 82' s/p bare-metal stent to LAD - Hx NSTEMI 22' s/p LCx and OM stent, PTCA of jailed LCx, RCA CTO with left-to-right collaterals  - LHC 21' PTCA to LCx (ISR) and stent to LAD  - LHC this admission with subtotally occluded proximal and distal LAD with thrombus and plaque formation in and around his old LAD stents. There was ~50% ISR of the LCx/OM stent as well and CTO of the RCA. Now s/p angioplasty of the proximal and distal LAD with restoration of flow. Unable to stent anything - S/p CABGx3 11/02/23: (LIMA-LAD, Rt Radial Artery- PDA, SVG-PLOM) - on low dose amlodipine  for radial graft per TCTS - Continue statin.  - Continue clopidogrel  (ASA allergy).  - No current s/s angina - Continue to mobilize   2. HFrEF, iCM - Echo 6/30: EF 30-35%, LV with RWMA, RV normal, trivial MR - NYHA IV on admission - Stable off pressors/inotropes - Appears euvolemic on exam. Does not need loop diuretic - Continue Jardiance  10 mg daily  - Continue Toprol  XL 50mg  daily. Can increase OP. Suspect HR will gradually come down.  - Continue spiro 25 mg daily  - Stop Losartan , switch to Entresto  24-26 mg BID - Continue digoxin  0.125 mg daily for rate control   3. HLD - LDL 51 10/29/23 - Continue statin, atorva 80 mg     4. Tobacco abuse - Still smoking 2-3 PPD - imperative to stop smoking - Nicotine  patch  5. ID -  Levofloxacin  started on 7/6 for possible COPD flare - WBC previously improving - CXR with LLL atx and effusion.  Evaluated with CCM personally under u/s. -> has fluid there but anatomy complicated by prvious thoracotomy. Will focus on IS, abx and diuresis. If needs tap can re-valuate under CT guidance - D/w CCM, continue levofloxacin  x 5 days, end-date today  Appears stable for discharge from AHF standpoint, pending MD eval. Will arrange close f/u in AHF clinic. Please send discharge meds to TOC.   AHF meds at discharge:  Amlodipine  per TCTS Atorvastatin  80 mg daily Plavix  75 mg daily Digoxin  0.125 mg daily  Jardiance  10 mg daily Entresto  24-26 mg BID Toprol  XL 50 mg daily Spiro 25 mg daily.   Beckey LITTIE Coe, NP  8:30 AM  Patient seen with NP, I formulated the plan and agree with the above note.    Breathing stable, no complaints.  HR remains around 100, sinus tachycardia.   General: NAD Neck: No JVD, no thyromegaly or thyroid  nodule.  Lungs: Occasional rhonchi.  CV: Nondisplaced PMI.  Heart regular S1/S2, no S3/S4, no murmur.  No peripheral edema.    Abdomen: Soft, nontender, no hepatosplenomegaly, no distention.  Skin: Intact without lesions or rashes.  Neurologic: Alert and oriented x 3.  Psych: Normal affect. Extremities: No clubbing or cyanosis.  HEENT: Normal.   Patient completes levofloxacin  today for possible COPD flare.  Denies dyspnea.    Volume status looks good, does not need loop diuretic. BP stable, transition from losartan  to Entresto  24/26 bid today.  He has ongoing mild sinus tachycardia, HR around 100 bpm.  No further intervention on this for now, suspect this will come down with time and GDMT titration.  He is clinically stable otherwise, think he is ready to go home.    I think he can go home today on the above medication regimen.  Follow closely in CHF clinic.   Ezra Shuck 11/10/2023 10:47 AM

## 2023-11-11 ENCOUNTER — Encounter (HOSPITAL_COMMUNITY): Payer: Self-pay

## 2023-11-11 ENCOUNTER — Other Ambulatory Visit: Payer: Self-pay

## 2023-11-11 ENCOUNTER — Emergency Department (HOSPITAL_COMMUNITY)
Admission: EM | Admit: 2023-11-11 | Discharge: 2023-12-01 | Disposition: E | Attending: Emergency Medicine | Admitting: Emergency Medicine

## 2023-11-11 DIAGNOSIS — I469 Cardiac arrest, cause unspecified: Secondary | ICD-10-CM | POA: Diagnosis present

## 2023-11-11 DIAGNOSIS — Z7902 Long term (current) use of antithrombotics/antiplatelets: Secondary | ICD-10-CM | POA: Diagnosis not present

## 2023-11-11 DIAGNOSIS — I4901 Ventricular fibrillation: Secondary | ICD-10-CM | POA: Insufficient documentation

## 2023-11-11 DIAGNOSIS — Z951 Presence of aortocoronary bypass graft: Secondary | ICD-10-CM | POA: Diagnosis not present

## 2023-11-11 MED ORDER — EPINEPHRINE 1 MG/10ML IJ SOSY
PREFILLED_SYRINGE | INTRAMUSCULAR | Status: AC | PRN
  Administered 2023-11-11 (×3): 1 mg via INTRAVENOUS

## 2023-11-11 MED ORDER — EPINEPHRINE 1 MG/10ML IJ SOSY
PREFILLED_SYRINGE | INTRAMUSCULAR | Status: AC
  Filled 2023-11-11: qty 10

## 2023-11-11 MED ORDER — EPINEPHRINE 1 MG/10ML IJ SOSY
PREFILLED_SYRINGE | INTRAMUSCULAR | Status: AC | PRN
  Administered 2023-11-11: 1 mg via INTRAVENOUS

## 2023-11-11 NOTE — Progress Notes (Signed)
   December 07, 2023 2312  Spiritual Encounters  Type of Visit Initial  Care provided to: [facility] partners present during encounter Nurse  Referral source Patient request  Reason for visit Patient death  OnCall Visit No  Spiritual Framework  Presenting Themes Meaning/purpose/sources of inspiration;Impactful experiences and emotions;Goals in life/care;Courage hope and growth;Significant life change  Community/Connection Family  Patient Stress Factors None identified  Family Stress Factors Loss  Interventions  Spiritual Care Interventions Made Reflective listening;Normalization of emotions;Narrative/life review;Meaning making;Bereavement/grief support;Supported grief process;Prayer  Intervention Outcomes  Outcomes Connection to spiritual care;Autonomy/agency  Spiritual Care Plan  Spiritual Care Issues Still Outstanding No further spiritual care needs at this time (see row info)   Referred to patient family via [facility] this evening due to patient death. Found wife and sister in law bedside when entering room. Chaplain engaged family in reflection around [place] life, relationships, and spiritual heritage. Provided spiritual presence and opportunity for life review today. Also sat bedside with family and provided final prayer bedside. No further needs noted at this time.   Rev. Ether Blush, M.Div Chaplain

## 2023-11-11 NOTE — Code Documentation (Signed)
 Patient time of death occurred at 12-17-2101 called by Dr. Cleotilde.

## 2023-11-11 NOTE — ED Notes (Signed)
 Nicotine patch removed from left shoulder.

## 2023-11-11 NOTE — ED Triage Notes (Signed)
 PER EMS: pt is from home, EMS was called out due to an unconscious person. His wife stated he lost consciousness at the dinner table. Initial rhythm v-fib. CPR started by EMS at 2002. Shock x 4 and the following meds were given by EMS: Epi x 4, Narcan , 450 mg of Amiodarone, Bicarb. They inserted a king airway. Per EMS, ROSC at 2024 and then the patient lost pulses again at 2044. The patient arrived to the ER at 2046 with ongoing chest compressions by the LUCAS. No other rhythm on monitor other than V-FIB en route, per ems.  He had a triple bypass surgery 9 days ago.

## 2023-11-11 NOTE — ED Notes (Signed)
 Family at bedside: wife and sister in law.

## 2023-11-11 NOTE — ED Provider Notes (Signed)
 [place] EMERGENCY DEPARTMENT AT [facility] Provider Note   CSN: [id number] Arrival date & time: 12-Nov-2023  2048     Patient presents with: Cardiac Arrest   Brian Caldwell is a 48 y.o. male.  {Add pertinent medical, surgical, social history, OB history to YEP:67052}  Cardiac Arrest    This patient is a 48 year old male, he presents to the hospital today in cardiac arrest.  Evidently this patient had recently had open heart surgery, according to the notes in the medical record on October 28, 2023 he underwent a left heart catheterization on the 30th, he had surgery on July 3 which was a 3 vessel coronary bypass graft.  That was approximately 9 days ago.  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  The patient is on medications including Plavix  and multiple medications including digoxin  as well.  He presents to the hospital in cardiac arrest.  The call went out at 7:44 PM for unresponsive.  When the paramedics found the patient he was unresponsive in ventricular fibrillation at his home.  They placed the pads on him and defibrillated times multiple episodes, there was no return of spontaneous circulation.  They placed him on the [facility] device for continuous CPR and administered multiple medications including epinephrine , Narcan , bicarbonate, and amiodarone, there is no return of spontaneous circulation.  On arrival the patient has been actively resuscitated for over an hour without return of spontaneous circulation.  Prior to Admission medications   Medication Sig Start Date End Date Taking? Authorizing Provider  acetaminophen  (TYLENOL ) 500 MG tablet Take 500 mg by mouth every 6 (six) hours as needed for mild pain.    [provider]  amLODipine  (NORVASC ) 2.5 MG tablet Take 1 tablet (2.5 mg total) by mouth daily. Stop after 08/16, you do not need a refill on this medication 11/10/23 12/16/23  Raguel Con GORMAN, PA-C   atorvastatin  (LIPITOR ) 80 MG tablet Take 1 tablet (80 mg total) by mouth daily. 04/05/22   Miriam Norris, NP  clopidogrel  (PLAVIX ) 75 MG tablet Take 1 tablet (75 mg total) by mouth daily. 11/10/23   Raguel Con GORMAN, PA-C  digoxin  (LANOXIN ) 0.125 MG tablet Take 1 tablet (0.125 mg total) by mouth daily. 11/10/23   Raguel Con GORMAN, PA-C  empagliflozin  (JARDIANCE ) 10 MG TABS tablet Take 1 tablet (10 mg total) by mouth daily. 11/10/23   Raguel Con GORMAN, PA-C  metoprolol  succinate (TOPROL -XL) 50 MG 24 hr tablet Take 1 tablet (50 mg total) by mouth daily. 11/10/23   Raguel Con GORMAN, PA-C  nicotine  (NICODERM CQ  - DOSED IN MG/24 HOURS) 21 mg/24hr patch Place 1 patch (21 mg total) onto the skin daily. 11/10/23   Raguel Con GORMAN, PA-C  oxyCODONE  (OXY IR/ROXICODONE ) 5 MG immediate release tablet Take 1 tablet (5 mg total) by mouth every 6 (six) hours as needed for severe pain (pain score 7-10). 11/10/23   Raguel Con GORMAN, PA-C  sacubitril -valsartan  (ENTRESTO ) 24-26 MG Take 1 tablet by mouth 2 (two) times daily. 11/10/23   Raguel Con GORMAN, PA-C  spironolactone  (ALDACTONE ) 25 MG tablet Take 1 tablet (25 mg total) by mouth daily. 11/10/23   Raguel Con GORMAN, PA-C    Allergies: Asa [aspirin ], Penicillins, Chantix  [varenicline ], and Tape    Review of Systems  Unable to perform ROS: Patient unresponsive    Updated Vital Signs BP (!) 132/53   Physical Exam Constitutional:      Comments: Unresponsive  HENT:     Head: Normocephalic and atraumatic.     Nose: Nose normal.     Mouth/Throat:     Mouth: Mucous membranes are moist.  Eyes:     Conjunctiva/sclera: Conjunctivae normal.  Cardiovascular:     Comments: No pulses, no intrinsic cardiac rate other than occasional V-fib mixed with asystole Pulmonary:     Comments: No spontaneous respiratory effort Abdominal:     Comments: Distended abdomen  Musculoskeletal:     Comments: Intraosseous line in the lower extremity anterior tibia   Neurological:     Comments: GCS of 3     (all labs ordered are listed, but only abnormal results are displayed) Labs Reviewed - No data to display  EKG: None  Radiology: [facility] Chest Port 1 View Result Date: 11/10/2023 CLINICAL DATA:  Cough and shortness of breath.  Status post CABG. EXAM: PORTABLE CHEST 1 VIEW COMPARISON:  11/07/2023 FINDINGS: The cardio pericardial silhouette is enlarged. There is pulmonary vascular congestion without overt pulmonary edema. Interval improvement in aeration at the left base with some residual minimal atelectasis. Telemetry leads overlie the chest. IMPRESSION: 1. Enlargement of the cardiopericardial silhouette with pulmonary vascular congestion. 2. Interval improvement in aeration at the left base with some residual minimal atelectasis. Electronically Signed   By: Camellia Candle M.D.   On: 11/10/2023 08:17    {Document cardiac monitor, telemetry assessment procedure when appropriate:32947} Procedure Name: Intubation Date/Time: 2023/11/14 9:06 PM  Performed by: Cleotilde Rogue, MDPre-anesthesia Checklist: Patient identified, Patient being monitored, Emergency Drugs available, Timeout performed and Suction available Oxygen Delivery Method: Non-rebreather mask Preoxygenation: Pre-oxygenation with 100% oxygen Ventilation: Mask ventilation without difficulty Laryngoscope Size: Mac and 4 Tube size: 8.0 mm Number of attempts: 1 Airway Equipment and Method: Stylet Placement Confirmation: ETT inserted through vocal cords under direct vision, CO2 detector and Breath sounds checked- equal and bilateral Secured at: 24 cm Tube secured with: ETT holder Dental Injury: Teeth and Oropharynx as per pre-operative assessment  Difficulty Due To: Difficulty was unanticipated Comments:      CPR  Date/Time: 2023/11/14 9:07 PM  Performed by: Cleotilde Rogue, MD Authorized by: Cleotilde Rogue, MD  CPR Procedure Details:      Amount of time prior to administration of ACLS/BLS  (minutes):  15   ACLS/BLS initiated by EMS: Yes     CPR/ACLS performed in the ED: Yes     Duration of CPR (minutes):  15   Outcome: Pt declared dead    CPR performed via ACLS guidelines under my direct supervision.  See RN documentation for details including defibrillator use, medications, doses and timing. Comments:       .Cardioversion  Date/Time: 14-Nov-2023 9:07 PM  Performed by: Cleotilde Rogue, MD Authorized by: Cleotilde Rogue, MD   Consent:    Consent obtained:  Emergent situation Universal protocol:    Immediately prior to procedure a time out was called: yes   Pre-procedure details:    Cardioversion basis:  Emergent   Pre-procedure rhythm: V fib. Patient sedated: No Attempt one:    Cardioversion mode:  Synchronous   Waveform:  Biphasic   Shock (Joules):  200   Shock outcome:  No change in rhythm Post-procedure details:    Patient status:  Unresponsive Comments:           Medications Ordered in the ED  EPINEPHrine  (ADRENALIN ) 1 MG/10ML injection (1 mg Intravenous Given 2023-11-14 2050)  EPINEPHrine  (ADRENALIN ) 1 MG/10ML injection (1 mg Intravenous Given 11/14/2023 2056)      {  Click here for ABCD2, HEART and other calculators REFRESH Note before signing:1}                              Medical Decision Making Amount and/or Complexity of Data Reviewed Labs: ordered.  Risk Prescription drug management.   On arrival the patient was requiring assisted ventilations through an esophageal tube.  I changed this out for an endotracheal tube, the patient was defibrillated once and I directed CPR for approximately 15 minutes.  During this time he was given resuscitative medications including epinephrine  without return of spontaneous circulation.  Awaiting family arrival at this time.  1 hour of efforts without ROSC, recent cardiac surgeyr Outcome poor - never had pulse or organized rhythm   TOD 9:03 PM  Discussed with Garnette Bohr medical examiner, agrees this is a  natural death, no need for medical examiner case or autopsy. \  {Document critical care time when appropriate  Document review of labs and clinical decision tools ie CHADS2VASC2, etc  Document your independent review of radiology images and any outside records  Document your discussion with family members, caretakers and with consultants  Document social determinants of health affecting pt's care  Document your decision making why or why not admission, treatments were needed:32947:::1}   Final diagnoses:  None    ED Discharge Orders     None

## 2023-11-11 NOTE — Code Documentation (Signed)
 Pt was intubated by Dr. Cleotilde.

## 2023-11-12 ENCOUNTER — Ambulatory Visit (HOSPITAL_COMMUNITY): Payer: Self-pay | Admitting: Cardiology

## 2023-11-13 LAB — CBG MONITORING, ED: Glucose-Capillary: 230 mg/dL — ABNORMAL HIGH (ref 70–99)

## 2023-11-17 ENCOUNTER — Encounter (HOSPITAL_COMMUNITY)

## 2023-11-17 ENCOUNTER — Telehealth: Admitting: Thoracic Surgery (Cardiothoracic Vascular Surgery)

## 2023-12-01 DEATH — deceased

## 2023-12-07 ENCOUNTER — Ambulatory Visit: Admitting: Nurse Practitioner
